# Patient Record
Sex: Male | Born: 1965 | Race: White | Hispanic: No | Smoking: Current every day smoker
Health system: Southern US, Community
[De-identification: ages and names within clinical notes are randomized; demographics above are authoritative.]

## PROBLEM LIST (undated history)

## (undated) DIAGNOSIS — F4310 Post-traumatic stress disorder, unspecified: Secondary | ICD-10-CM

## (undated) DIAGNOSIS — F419 Anxiety disorder, unspecified: Secondary | ICD-10-CM

## (undated) DIAGNOSIS — M545 Low back pain, unspecified: Secondary | ICD-10-CM

## (undated) DIAGNOSIS — I2699 Other pulmonary embolism without acute cor pulmonale: Secondary | ICD-10-CM

## (undated) DIAGNOSIS — C4442 Squamous cell carcinoma of skin of scalp and neck: Secondary | ICD-10-CM

## (undated) DIAGNOSIS — Z89619 Acquired absence of unspecified leg above knee: Secondary | ICD-10-CM

## (undated) DIAGNOSIS — I10 Essential (primary) hypertension: Secondary | ICD-10-CM

## (undated) DIAGNOSIS — I480 Paroxysmal atrial fibrillation: Secondary | ICD-10-CM

## (undated) DIAGNOSIS — R079 Chest pain, unspecified: Secondary | ICD-10-CM

## (undated) DIAGNOSIS — J449 Chronic obstructive pulmonary disease, unspecified: Secondary | ICD-10-CM

## (undated) DIAGNOSIS — K219 Gastro-esophageal reflux disease without esophagitis: Secondary | ICD-10-CM

## (undated) DIAGNOSIS — I38 Endocarditis, valve unspecified: Secondary | ICD-10-CM

## (undated) DIAGNOSIS — I219 Acute myocardial infarction, unspecified: Secondary | ICD-10-CM

## (undated) DIAGNOSIS — G8929 Other chronic pain: Secondary | ICD-10-CM

## (undated) DIAGNOSIS — Q6689 Other  specified congenital deformities of feet: Secondary | ICD-10-CM

## (undated) DIAGNOSIS — I2690 Septic pulmonary embolism without acute cor pulmonale: Secondary | ICD-10-CM

## (undated) DIAGNOSIS — I639 Cerebral infarction, unspecified: Secondary | ICD-10-CM

## (undated) DIAGNOSIS — Z9289 Personal history of other medical treatment: Secondary | ICD-10-CM

## (undated) DIAGNOSIS — F988 Other specified behavioral and emotional disorders with onset usually occurring in childhood and adolescence: Secondary | ICD-10-CM

## (undated) DIAGNOSIS — F1910 Other psychoactive substance abuse, uncomplicated: Secondary | ICD-10-CM

## (undated) DIAGNOSIS — I2510 Atherosclerotic heart disease of native coronary artery without angina pectoris: Secondary | ICD-10-CM

## (undated) DIAGNOSIS — E785 Hyperlipidemia, unspecified: Secondary | ICD-10-CM

## (undated) HISTORY — PX: CARDIAC CATHETERIZATION: SHX172

## (undated) HISTORY — PX: CORONARY ANGIOPLASTY WITH STENT PLACEMENT: SHX49

## (undated) HISTORY — PX: LAPAROSCOPIC CHOLECYSTECTOMY: SUR755

## (undated) HISTORY — DX: Personal history of other medical treatment: Z92.89

## (undated) HISTORY — PX: TONSILLECTOMY: SUR1361

## (undated) HISTORY — PX: CORONARY ANGIOPLASTY: SHX604

## (undated) HISTORY — PX: ABOVE KNEE LEG AMPUTATION: SUR20

## (undated) HISTORY — PX: ORTHOPEDIC SURGERY: SHX850

## (undated) HISTORY — DX: Other psychoactive substance abuse, uncomplicated: F19.10

## (undated) HISTORY — DX: Septic pulmonary embolism without acute cor pulmonale: I26.90

## (undated) HISTORY — DX: Other chronic pain: G89.29

## (undated) HISTORY — DX: Chest pain, unspecified: R07.9

---
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
## 2011-11-09 DIAGNOSIS — I38 Endocarditis, valve unspecified: Secondary | ICD-10-CM

## 2011-11-09 DIAGNOSIS — I2690 Septic pulmonary embolism without acute cor pulmonale: Secondary | ICD-10-CM

## 2011-11-09 HISTORY — DX: Endocarditis, valve unspecified: I38

## 2011-11-09 HISTORY — DX: Septic pulmonary embolism without acute cor pulmonale: I26.90

## 2014-01-10 DIAGNOSIS — R0602 Shortness of breath: Secondary | ICD-10-CM | POA: Insufficient documentation

## 2014-01-21 DIAGNOSIS — Z9861 Coronary angioplasty status: Secondary | ICD-10-CM | POA: Insufficient documentation

## 2014-01-21 DIAGNOSIS — Z955 Presence of coronary angioplasty implant and graft: Secondary | ICD-10-CM | POA: Insufficient documentation

## 2014-01-21 DIAGNOSIS — E7800 Pure hypercholesterolemia, unspecified: Secondary | ICD-10-CM | POA: Insufficient documentation

## 2014-09-15 DIAGNOSIS — I679 Cerebrovascular disease, unspecified: Secondary | ICD-10-CM | POA: Insufficient documentation

## 2014-09-15 DIAGNOSIS — I6782 Cerebral ischemia: Secondary | ICD-10-CM | POA: Insufficient documentation

## 2014-09-15 DIAGNOSIS — G939 Disorder of brain, unspecified: Secondary | ICD-10-CM | POA: Insufficient documentation

## 2014-09-15 DIAGNOSIS — I2699 Other pulmonary embolism without acute cor pulmonale: Secondary | ICD-10-CM | POA: Insufficient documentation

## 2014-10-29 DIAGNOSIS — M549 Dorsalgia, unspecified: Secondary | ICD-10-CM | POA: Insufficient documentation

## 2014-10-29 DIAGNOSIS — G8929 Other chronic pain: Secondary | ICD-10-CM | POA: Insufficient documentation

## 2014-10-29 DIAGNOSIS — K219 Gastro-esophageal reflux disease without esophagitis: Secondary | ICD-10-CM | POA: Insufficient documentation

## 2015-01-21 DIAGNOSIS — I808 Phlebitis and thrombophlebitis of other sites: Secondary | ICD-10-CM | POA: Insufficient documentation

## 2015-02-11 DIAGNOSIS — G894 Chronic pain syndrome: Secondary | ICD-10-CM | POA: Insufficient documentation

## 2015-04-17 DIAGNOSIS — Z955 Presence of coronary angioplasty implant and graft: Secondary | ICD-10-CM | POA: Insufficient documentation

## 2015-05-02 DIAGNOSIS — R55 Syncope and collapse: Secondary | ICD-10-CM | POA: Insufficient documentation

## 2015-05-30 DIAGNOSIS — N179 Acute kidney failure, unspecified: Secondary | ICD-10-CM | POA: Insufficient documentation

## 2015-05-30 DIAGNOSIS — B955 Unspecified streptococcus as the cause of diseases classified elsewhere: Secondary | ICD-10-CM | POA: Insufficient documentation

## 2015-05-30 DIAGNOSIS — R7881 Bacteremia: Secondary | ICD-10-CM

## 2015-05-30 DIAGNOSIS — M79661 Pain in right lower leg: Secondary | ICD-10-CM | POA: Insufficient documentation

## 2015-11-18 ENCOUNTER — Emergency Department (HOSPITAL_COMMUNITY): Payer: Medicare Other

## 2015-11-18 ENCOUNTER — Encounter (HOSPITAL_COMMUNITY): Payer: Self-pay | Admitting: Emergency Medicine

## 2015-11-18 ENCOUNTER — Inpatient Hospital Stay (HOSPITAL_COMMUNITY)
Admission: EM | Admit: 2015-11-18 | Discharge: 2015-11-19 | DRG: 303 | Disposition: A | Discharge status: Home / Self Care | Payer: Medicare Other | Attending: Internal Medicine | Admitting: Internal Medicine

## 2015-11-18 DIAGNOSIS — I25119 Atherosclerotic heart disease of native coronary artery with unspecified angina pectoris: Secondary | ICD-10-CM | POA: Diagnosis not present

## 2015-11-18 DIAGNOSIS — Z79891 Long term (current) use of opiate analgesic: Secondary | ICD-10-CM

## 2015-11-18 DIAGNOSIS — Z79899 Other long term (current) drug therapy: Secondary | ICD-10-CM

## 2015-11-18 DIAGNOSIS — Z955 Presence of coronary angioplasty implant and graft: Secondary | ICD-10-CM | POA: Diagnosis not present

## 2015-11-18 DIAGNOSIS — Z7902 Long term (current) use of antithrombotics/antiplatelets: Secondary | ICD-10-CM | POA: Diagnosis not present

## 2015-11-18 DIAGNOSIS — I10 Essential (primary) hypertension: Secondary | ICD-10-CM | POA: Diagnosis not present

## 2015-11-18 DIAGNOSIS — Z888 Allergy status to other drugs, medicaments and biological substances status: Secondary | ICD-10-CM

## 2015-11-18 DIAGNOSIS — I2510 Atherosclerotic heart disease of native coronary artery without angina pectoris: Secondary | ICD-10-CM

## 2015-11-18 DIAGNOSIS — G894 Chronic pain syndrome: Secondary | ICD-10-CM | POA: Diagnosis not present

## 2015-11-18 DIAGNOSIS — F411 Generalized anxiety disorder: Secondary | ICD-10-CM | POA: Insufficient documentation

## 2015-11-18 DIAGNOSIS — Z8249 Family history of ischemic heart disease and other diseases of the circulatory system: Secondary | ICD-10-CM | POA: Diagnosis not present

## 2015-11-18 DIAGNOSIS — R079 Chest pain, unspecified: Secondary | ICD-10-CM | POA: Diagnosis present

## 2015-11-18 DIAGNOSIS — I252 Old myocardial infarction: Secondary | ICD-10-CM | POA: Diagnosis not present

## 2015-11-18 DIAGNOSIS — Z801 Family history of malignant neoplasm of trachea, bronchus and lung: Secondary | ICD-10-CM

## 2015-11-18 DIAGNOSIS — F419 Anxiety disorder, unspecified: Secondary | ICD-10-CM | POA: Diagnosis not present

## 2015-11-18 DIAGNOSIS — I480 Paroxysmal atrial fibrillation: Secondary | ICD-10-CM | POA: Diagnosis present

## 2015-11-18 DIAGNOSIS — F17210 Nicotine dependence, cigarettes, uncomplicated: Secondary | ICD-10-CM | POA: Diagnosis not present

## 2015-11-18 DIAGNOSIS — Z880 Allergy status to penicillin: Secondary | ICD-10-CM | POA: Diagnosis not present

## 2015-11-18 DIAGNOSIS — E785 Hyperlipidemia, unspecified: Secondary | ICD-10-CM | POA: Diagnosis not present

## 2015-11-18 DIAGNOSIS — Z89512 Acquired absence of left leg below knee: Secondary | ICD-10-CM | POA: Diagnosis not present

## 2015-11-18 DIAGNOSIS — I209 Angina pectoris, unspecified: Secondary | ICD-10-CM | POA: Diagnosis not present

## 2015-11-18 DIAGNOSIS — Z8673 Personal history of transient ischemic attack (TIA), and cerebral infarction without residual deficits: Secondary | ICD-10-CM

## 2015-11-18 DIAGNOSIS — Z86711 Personal history of pulmonary embolism: Secondary | ICD-10-CM

## 2015-11-18 DIAGNOSIS — F17200 Nicotine dependence, unspecified, uncomplicated: Secondary | ICD-10-CM | POA: Diagnosis present

## 2015-11-18 HISTORY — DX: Other chronic pain: G89.29

## 2015-11-18 HISTORY — DX: Other specified behavioral and emotional disorders with onset usually occurring in childhood and adolescence: F98.8

## 2015-11-18 HISTORY — DX: Acute myocardial infarction, unspecified: I21.9

## 2015-11-18 HISTORY — DX: Low back pain: M54.5

## 2015-11-18 HISTORY — DX: Hyperlipidemia, unspecified: E78.5

## 2015-11-18 HISTORY — DX: Cerebral infarction, unspecified: I63.9

## 2015-11-18 HISTORY — DX: Gastro-esophageal reflux disease without esophagitis: K21.9

## 2015-11-18 HISTORY — DX: Low back pain, unspecified: M54.50

## 2015-11-18 HISTORY — DX: Anxiety disorder, unspecified: F41.9

## 2015-11-18 HISTORY — DX: Post-traumatic stress disorder, unspecified: F43.10

## 2015-11-18 HISTORY — DX: Essential (primary) hypertension: I10

## 2015-11-18 HISTORY — DX: Atherosclerotic heart disease of native coronary artery without angina pectoris: I25.10

## 2015-11-18 LAB — BASIC METABOLIC PANEL
Anion gap: 11 (ref 5–15)
BUN: 8 mg/dL (ref 6–20)
CALCIUM: 9.6 mg/dL (ref 8.9–10.3)
CO2: 25 mmol/L (ref 22–32)
CREATININE: 1.04 mg/dL (ref 0.61–1.24)
Chloride: 105 mmol/L (ref 101–111)
Glucose, Bld: 90 mg/dL (ref 65–99)
Potassium: 4 mmol/L (ref 3.5–5.1)
Sodium: 141 mmol/L (ref 135–145)

## 2015-11-18 LAB — D-DIMER, QUANTITATIVE: D-Dimer, Quant: <0.27 ug/mL-FEU (ref 0.00–0.50)

## 2015-11-18 LAB — RAPID URINE DRUG SCREEN, HOSP PERFORMED
Amphetamines: NOT DETECTED
BARBITURATES: NOT DETECTED
Benzodiazepines: NOT DETECTED
Cocaine: NOT DETECTED
OPIATES: NOT DETECTED
TETRAHYDROCANNABINOL: NOT DETECTED

## 2015-11-18 LAB — CBC
HCT: 41.8 % (ref 39.0–52.0)
Hemoglobin: 14.1 g/dL (ref 13.0–17.0)
MCH: 32 pg (ref 26.0–34.0)
MCHC: 33.7 g/dL (ref 30.0–36.0)
MCV: 95 fL (ref 78.0–100.0)
PLATELETS: 314 10*3/uL (ref 150–400)
RBC: 4.4 MIL/uL (ref 4.22–5.81)
RDW: 14 % (ref 11.5–15.5)
WBC: 8.9 10*3/uL (ref 4.0–10.5)

## 2015-11-18 LAB — I-STAT TROPONIN, ED: TROPONIN I, POC: 0 ng/mL (ref 0.00–0.08)

## 2015-11-18 MED ORDER — OXYCODONE HCL 5 MG PO TABS
5.0000 mg | ORAL_TABLET | ORAL | Status: DC | PRN
  Filled 2015-11-18: qty 1

## 2015-11-18 MED ORDER — ONDANSETRON HCL 4 MG/2ML IJ SOLN: 4.0000 mg | Freq: Four times a day (QID) | INTRAMUSCULAR | Status: DC | PRN

## 2015-11-18 MED ORDER — ALPRAZOLAM 0.5 MG PO TABS
1.0000 mg | ORAL_TABLET | Freq: Three times a day (TID) | ORAL | Status: DC
  Administered 2015-11-18 – 2015-11-19 (×2): 1 mg via ORAL
  Filled 2015-11-18 (×4): qty 2

## 2015-11-18 MED ORDER — NICOTINE 21 MG/24HR TD PT24
21.0000 mg | MEDICATED_PATCH | Freq: Every day | TRANSDERMAL | Status: DC
  Administered 2015-11-18: 21 mg via TRANSDERMAL
  Filled 2015-11-18: qty 1

## 2015-11-18 MED ORDER — ENOXAPARIN SODIUM 40 MG/0.4ML ~~LOC~~ SOLN
40.0000 mg | Freq: Every day | SUBCUTANEOUS | Status: DC
  Filled 2015-11-18: qty 0.4

## 2015-11-18 MED ORDER — MORPHINE SULFATE (PF) 2 MG/ML IV SOLN: 2.0000 mg | INTRAVENOUS | Status: DC | PRN

## 2015-11-18 MED ORDER — OXYMORPHONE HCL ER 10 MG PO T12A
40.0000 mg | EXTENDED_RELEASE_TABLET | Freq: Two times a day (BID) | ORAL | Status: DC
  Administered 2015-11-18 – 2015-11-19 (×2): 40 mg via ORAL
  Filled 2015-11-18 (×3): qty 4

## 2015-11-18 MED ORDER — OXYCODONE-ACETAMINOPHEN 5-325 MG PO TABS
1.0000 | ORAL_TABLET | ORAL | Status: DC | PRN
  Filled 2015-11-18: qty 1

## 2015-11-18 MED ORDER — CLOPIDOGREL BISULFATE 75 MG PO TABS
75.0000 mg | ORAL_TABLET | Freq: Every day | ORAL | Status: DC
  Administered 2015-11-19: 75 mg via ORAL
  Filled 2015-11-18: qty 1

## 2015-11-18 MED ORDER — RANOLAZINE ER 500 MG PO TB12
500.0000 mg | ORAL_TABLET | Freq: Every day | ORAL | Status: DC
  Administered 2015-11-19: 500 mg via ORAL
  Filled 2015-11-18: qty 1

## 2015-11-18 MED ORDER — ACETAMINOPHEN 325 MG PO TABS
650.0000 mg | ORAL_TABLET | ORAL | Status: DC | PRN
Start: 2015-11-18 — End: 2015-11-19

## 2015-11-18 MED ORDER — ASPIRIN 81 MG PO CHEW
324.0000 mg | CHEWABLE_TABLET | Freq: Once | ORAL | Status: AC
  Administered 2015-11-18: 324 mg via ORAL
  Filled 2015-11-18: qty 4

## 2015-11-18 MED ORDER — ROSUVASTATIN CALCIUM 10 MG PO TABS: 5.0000 mg | ORAL_TABLET | Freq: Every day | ORAL | Status: DC

## 2015-11-18 MED ORDER — REGADENOSON 0.4 MG/5ML IV SOLN
0.4000 mg | Freq: Once | INTRAVENOUS | Status: AC
  Administered 2015-11-19: 0.4 mg via INTRAVENOUS
  Filled 2015-11-18: qty 5

## 2015-11-18 MED ORDER — OXYCODONE-ACETAMINOPHEN 10-325 MG PO TABS: 1.0000 | ORAL_TABLET | ORAL | Status: DC | PRN

## 2015-11-18 MED ORDER — NEBIVOLOL HCL 5 MG PO TABS
10.0000 mg | ORAL_TABLET | Freq: Every day | ORAL | Status: DC
  Administered 2015-11-19: 10 mg via ORAL
  Filled 2015-11-18: qty 2

## 2015-11-18 MED ORDER — NITROGLYCERIN 0.4 MG SL SUBL: 0.4000 mg | SUBLINGUAL_TABLET | SUBLINGUAL | Status: DC | PRN

## 2015-11-18 NOTE — Progress Notes (Addendum)
Error

## 2015-11-18 NOTE — ED Provider Notes (Signed)
CSN: [id number]     Arrival date & time 11/18/15  1431 History   First MD Initiated Contact with Patient 11/18/15 1731     Chief Complaint  Patient presents with  . 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 He presents today with substernal chest pain that has been intermittent, constant since 11 AM this morning. Pressure radiates to his shoulder blades. Accompanied by slight shortness of breath and a little bit of nausea. No diaphoresis.  Pt still smokes. Previous IV drug user, hasn't used in "a long time". Also has a history of pericarditis and endocarditis.   He denies cough, fever, chills, N/V, diarrhea, constipation, hematemesis, dysuria, hematuria, sick contacts, or recent travel.   (Consider location/radiation/quality/duration/timing/severity/associated sxs/prior Treatment) Patient is a 50 y.o. male presenting with chest pain.  Chest Pain Pain location:  Substernal area Pain quality: pressure   Pain radiates to:  Upper back Pain radiates to the back: yes   Pain severity:  Moderate Onset quality:  Sudden Duration:  1 week (1 week intermittently, constant since 11AM) Chronicity:  Recurrent Context: at rest   Associated symptoms: nausea and shortness of breath   Associated symptoms: no abdominal pain, no dizziness, no fever, no headache, no palpitations and not vomiting   Risk factors: coronary artery disease, high cholesterol, hypertension, male sex and smoking   Risk factors: no diabetes mellitus     Past Medical History  Diagnosis Date  . Coronary artery disease   . S/P AKA (above knee amputation) ([place])   . Hypertension   . Stroke ([place])   . PE (pulmonary embolism)    History reviewed. No pertinent past surgical history. History reviewed. No pertinent  family history. Social History  Substance Use Topics  . Smoking status: Current Every Day Smoker  . Smokeless tobacco: None  . Alcohol Use: No    Review of Systems  Constitutional: Negative for fever and chills.  Respiratory: Positive for shortness of breath.   Cardiovascular: Positive for chest pain. Negative for palpitations and leg swelling.  Gastrointestinal: Positive for nausea. Negative for vomiting, abdominal pain, diarrhea, constipation and abdominal distention.  Genitourinary: Negative for dysuria, frequency, flank pain and decreased urine volume.  Skin: Negative for rash and wound.  Neurological: Negative for dizziness, speech difficulty, light-headedness and headaches.  All other systems reviewed and are negative.     Allergies  Adipex-p; Benadryl; Promethazine hcl; Augmentin; and Toradol  Home Medications   Prior to Admission medications   Medication Sig Start Date End Date Taking? Authorizing Provider  ALPRAZolam Duanne Moron) 1 MG tablet Take 1 mg by mouth 3 (three) times daily.   Yes Historical Provider, MD  clopidogrel (PLAVIX) 75 MG tablet Take 75 mg by mouth daily.   Yes Historical Provider, MD  nebivolol (BYSTOLIC) 10 MG tablet Take 10 mg by mouth daily.   Yes Historical Provider, MD  oxyCODONE-acetaminophen (PERCOCET) 10-325 MG tablet Take 1 tablet by mouth every 4 (four) hours as needed for pain.   Yes Historical Provider, MD  oxymorphone (OPANA ER) 40 MG 12 hr tablet Take 40 mg by mouth every 12 (twelve) hours.   Yes Historical Provider, MD  ranolazine (RANEXA) 1000 MG SR tablet Take 500 mg by mouth daily.   Yes Historical Provider, MD  rosuvastatin (CRESTOR) 5 MG tablet Take 5 mg by mouth daily at 6 PM.   Yes Historical Provider, MD   BP 140/91 mmHg  Pulse 74  Temp(Src) 98.6 F (37 C) (Oral)  Resp 23  SpO2 99% Physical Exam  Constitutional: He is oriented to person, place, and time. He appears well-developed and well-nourished. No distress.  HENT:  Head:  Normocephalic and atraumatic.  Eyes: Pupils are equal, round, and reactive to light.  Neck: Normal range of motion.  Cardiovascular: Normal rate, regular rhythm, normal heart sounds and intact distal pulses.  Exam reveals no gallop and no friction rub.   No murmur heard. Pulmonary/Chest: Effort normal and breath sounds normal. No respiratory distress. He has no wheezes. He has no rales. He exhibits no tenderness.  Abdominal: Soft. Bowel sounds are normal. He exhibits no distension and no mass. There is no tenderness. There is no rebound and no guarding.  Musculoskeletal: Normal range of motion. He exhibits no edema or tenderness.  Left AKA. No thigh pain. Right leg w/no edema, no posterior knee pain.   Lymphadenopathy:    He has no cervical adenopathy.  Neurological: He is alert and oriented to person, place, and time. No cranial nerve deficit. Coordination normal.  Skin: Skin is warm and dry. He is not diaphoretic.  Nursing note and vitals reviewed.   ED Course  Procedures (including critical care time) Labs Review Labs Reviewed  BASIC METABOLIC PANEL  CBC  D-DIMER, QUANTITATIVE (NOT AT [facility])  I-STAT TROPOININ, ED  Randolm Idol, ED    Imaging Review Dg Chest 2 View  11/18/2015  CLINICAL DATA:  Chest pain, shortness of breath since noon today. EXAM: CHEST  2 VIEW COMPARISON:  None. FINDINGS: Heart is normal size. Mediastinal contours are within limits. Coronary artery stents noted. Lungs are clear. No effusions. No acute bony abnormality. IMPRESSION: No active cardiopulmonary disease. Electronically Signed   By: Rolm Baptise M.D.   On: 11/18/2015 16:01   I have personally reviewed and evaluated these images and lab results as part of my medical decision-making.   EKG Interpretation   Date/Time:  Tuesday November 18 2015 14:42:15 EST Ventricular Rate:  93 PR Interval:  134 QRS Duration: 84 QT Interval:  336 QTC Calculation: 417 R Axis:   77 Text Interpretation:  Normal  sinus rhythm Normal ECG No old tracing to  compare Confirmed by GOLDSTON  MD, SCOTT ([id number]) on 11/18/2015 5:45:37 PM      MDM   Final diagnoses:  Chest pain at rest   50 year old male here with chest pain. See history of present illness for details. On exam patient in NAD, AF, VSS. Physical exam completely benign. Chest x-ray with no abnormalities. Initial troponin 0. Labs are reassuring. EKG with NSR no sign of ischemia or arrhythmia. No prior to compare.  Given ASA and nitro for CP.  Attempted chart review to validate patient's extensive cardiac history. Unsuccessful. Will discuss with cardiology.--> feel pt likely needs stress test in AM.   Pt is high cardiac risk by history. Will admit to hospital w/cardiology following. Will also need his chronic meds managed as he has not been able to set up with a PCP yet: needs plavix for PE and all home meds.   Pt was seen under the supervision of Dr. Regenia Skeeter.     Sherian Maroon, MD 11/18/15 1932  Sherwood Gambler, MD 11/21/15 [phone]

## 2015-11-18 NOTE — ED Notes (Signed)
MD at bedside. 

## 2015-11-18 NOTE — H&P (Signed)
[facility] History and Physical  Aaron Dodson [id number] DOB: 1966-04-10 DOA: 11/18/2015  Referring physician: Dr.SMITH. PCP: Pcp Not In System patient just recently moved into [place]. Specialists: Patient just recently moved into [place].  Chief Complaint: Chest pain.  HPI: Aaron Dodson is a 50 y.o. male with history of CAD status post stenting, hypertension, hyperlipidemia and history of PE presents to the ER with complaints of chest pain. Patient states he has been having chest pain which has been chronic but recently worsened. Patient chest pain is present even at rest. Retrosternal nonradiating pressure-like. Denies any associated shortness of breath productive cough fever chills. In the ER chest x-ray EKG were unremarkable cardiac markers were negative and on-call cardiologist were consulted and patient has been admitted for further management. Sublingual nitroglycerin eases the chest pain but does not completely relieve.   Review of Systems: As presented in the history of presenting illness, rest negative.  Past Medical History  Diagnosis Date  . Coronary artery disease   . Hypertension   . PE (pulmonary embolism) ~ 2007?; 09/2015  . Hyperlipidemia   . Anginal pain ([place])   . PTSD (post-traumatic stress disorder)   . Myocardial infarction [facility]) 2000's - ~ 04/2015    "I've had a total of 3" (11/18/2015)  . GERD (gastroesophageal reflux disease)   . Stroke [facility]) ~ 2006    denies residual on 11/18/2015  . Chronic lower back pain   . Anxiety   . ADD (attention deficit disorder)    Past Surgical History  Procedure Laterality Date  . Tonsillectomy    . Laparoscopic cholecystectomy    . Cardiac catheterization  "several"  . Coronary angioplasty  "several"  . Coronary angioplasty with stent placement  "several"    "total of 10 stents placed" (11/18/2015)  . Club foot release Left 03-17-1967  . Above knee leg amputation Left   . Orthopedic surgery  04/20/1967-~ 2007    "total of 52 on my left leg; started w/club foot released"   Social History:  reports that he has been smoking Cigarettes.  He has a 52.5 pack-year smoking history. He has never used smokeless tobacco. He reports that he uses illicit drugs. He reports that he does not drink alcohol. Where does patient live home. Can patient participate in ADLs? Yes.  Allergies  Allergen Reactions  . Adipex-P [Phentermine]     Aggressive behavior  . Benadryl [Diphenhydramine Hcl]     Aggressive behavior  . Promethazine Hcl     Unknown - given for surgical procedure and advised to never take again  . Augmentin [Amoxicillin-Pot Clavulanate] Diarrhea and Nausea Only  . Toradol [Ketorolac Tromethamine] Hives    Redness    Family History:  Family History  Problem Relation Age of Onset  . Hypertension Father   . Lung cancer Father       Prior to Admission medications   Medication Sig Start Date End Date Taking? Authorizing Provider  ALPRAZolam Duanne Moron) 1 MG tablet Take 1 mg by mouth 3 (three) times daily.   Yes Historical Provider, MD  clopidogrel (PLAVIX) 75 MG tablet Take 75 mg by mouth daily.   Yes Historical Provider, MD  nebivolol (BYSTOLIC) 10 MG tablet Take 10 mg by mouth daily.   Yes Historical Provider, MD  oxyCODONE-acetaminophen (PERCOCET) 10-325 MG tablet Take 1 tablet by mouth every 4 (four) hours as needed for pain.   Yes Historical Provider, MD  oxymorphone (OPANA ER) 40 MG 12 hr tablet Take 40 mg by  mouth every 12 (twelve) hours.   Yes Historical Provider, MD  ranolazine (RANEXA) 1000 MG SR tablet Take 500 mg by mouth daily.   Yes Historical Provider, MD  rosuvastatin (CRESTOR) 5 MG tablet Take 5 mg by mouth daily at 6 PM.   Yes Historical Provider, MD    Physical Exam: Filed Vitals:   11/18/15 1930 11/18/15 2014 11/18/15 2030 11/18/15 2133  BP: 137/98 134/91 152/93 168/76  Pulse: 80 69 73 76  Temp:    98.5 F (36.9 C)  TempSrc:    Oral  Resp: 12 16 19 18   Height:    6' (1.829  m)  Weight:    74.481 kg (164 lb 3.2 oz)  SpO2: 97% 100% 100% 100%     General:  Moderately built and nourished.  Eyes: Anicteric no pallor.  ENT: No discharge from the ears eyes nose or mouth.  Neck: No JVD appreciated. No mass felt.  Cardiovascular: S1 and S2 heard.  Respiratory: No rhonchi or crepitations.  Abdomen: Soft nontender bowel sounds present. No guarding or rigidity.  Skin: No rash.  Musculoskeletal: No edema.  Psychiatric: Appears normal.  Neurologic: Alert awake oriented to time place and person. Moves all extremities.  Labs on Admission:  Basic Metabolic Panel:  Recent Labs Lab 11/18/15 1447  NA 141  K 4.0  CL 105  CO2 25  GLUCOSE 90  BUN 8  CREATININE 1.04  CALCIUM 9.6   Liver Function Tests: No results for input(s): AST, ALT, ALKPHOS, BILITOT, PROT, ALBUMIN in the last 168 hours. No results for input(s): LIPASE, AMYLASE in the last 168 hours. No results for input(s): AMMONIA in the last 168 hours. CBC:  Recent Labs Lab 11/18/15 1447  WBC 8.9  HGB 14.1  HCT 41.8  MCV 95.0  PLT 314   Cardiac Enzymes: No results for input(s): CKTOTAL, CKMB, CKMBINDEX, TROPONINI in the last 168 hours.  BNP (last 3 results) No results for input(s): BNP in the last 8760 hours.  ProBNP (last 3 results) No results for input(s): PROBNP in the last 8760 hours.  CBG: No results for input(s): GLUCAP in the last 168 hours.  Radiological Exams on Admission: Dg Chest 2 View  11/18/2015  CLINICAL DATA:  Chest pain, shortness of breath since noon today. EXAM: CHEST  2 VIEW COMPARISON:  None. FINDINGS: Heart is normal size. Mediastinal contours are within limits. Coronary artery stents noted. Lungs are clear. No effusions. No acute bony abnormality. IMPRESSION: No active cardiopulmonary disease. Electronically Signed   By: Rolm Baptise M.D.   On: 11/18/2015 16:01    EKG: Independently reviewed. Normal sinus rhythm.  Assessment/Plan Principal Problem:    Chest pain Active Problems:   HLD (hyperlipidemia)   Essential hypertension   Tobacco abuse   1. Chest pain - concerning for angina. Appreciate cardiology consult. Cycle cardiac markers. Patient is on Plavix and statins and beta blockers. Nothing by mouth past for a.m. for stress test. 2. Hypertension - initially blood pressure was mildly elevated. Closely follow blood pressure trends. Continue home medications. 3. Hyperlipidemia on statins. 4. Tobacco abuse - tobacco cessation counseling requested.   DVT Prophylaxis Lovenox.  Code Status: Full code.  Family Communication: Discussed with patient's family at the bedside.  Disposition Plan: Admit for observation.    Lerlene Treadwell N. [facility] Pager [phone].  If 7PM-7AM, please contact night-coverage [email or web address] Password [email or web address] 11/18/2015, 10:59 PM

## 2015-11-18 NOTE — ED Notes (Signed)
Pt sts left sided CP with SOB; pt sts hx of stents; pt sts pain started 3 hours ago

## 2015-11-18 NOTE — ED Notes (Signed)
Attempted IV start x3. This RN one time and RN Hayley x2 Ultrasound guided. Unsuccessful. Floor RN notified. States she will put in IV team order

## 2015-11-18 NOTE — Consult Note (Signed)
Patient ID: Aaron Dodson MRN: [id number], DOB/AGE: 02/06/1966   Admit date: 11/18/2015   Primary Physician: No PCP Per Patient Primary Cardiologist: New  Pt. Profile:  50 y/o male, recently moved to the area from [place], with reported long h/o CAD and PAF presenting to ED with CP c/w prior angina.   Problem List  Past Medical History  Diagnosis Date  . Coronary artery disease   . S/P AKA (above knee amputation) ([place])   . Hypertension   . Stroke ([place])   . PE (pulmonary embolism)     History reviewed. No pertinent past surgical history.   Allergies  Allergies  Allergen Reactions  . Adipex-P [Phentermine]     Aggressive behavior  . Benadryl [Diphenhydramine Hcl]     Aggressive behavior  . Promethazine Hcl     Unknown - given for surgical procedure and advised to never take again  . Augmentin [Amoxicillin-Pot Clavulanate] Diarrhea and Nausea Only  . Toradol [Ketorolac Tromethamine] Hives    Redness    HPI  50 y/o male, recently moved to the area from [place], with reported long h/o CAD presenting to ED with CP c/w prior angina. He has received cardiac care are both in [place] and [place]. He has had multiple MIs and strokes. His last intervention was in June 2015 and he underwent stenting to his RCA and LCx. Also with a h/o PAF, tobacco abuse, HTN and left BKA, now with a prosthesis.   He reports that he has to report for prison on 12/01/15 for 10 months. He has been having symptoms of recurrent angina. He tried to get in with a new PCP but cannot get an appointment before his prison report date. He wants to be assessed for worsening CAD prior to being detained. Thus, he reported to the ED for evaluation. Cardiac enzymes are negative x 2. EKG shows NSR w/o ischemia. He is currently CP free.   Home Medications  Prior to Admission medications   Medication Sig Start Date End Date Taking? Authorizing Provider  ALPRAZolam Duanne Moron) 1 MG tablet Take 1 mg by  mouth 3 (three) times daily.   Yes Historical Provider, MD  clopidogrel (PLAVIX) 75 MG tablet Take 75 mg by mouth daily.   Yes Historical Provider, MD  nebivolol (BYSTOLIC) 10 MG tablet Take 10 mg by mouth daily.   Yes Historical Provider, MD  oxyCODONE-acetaminophen (PERCOCET) 10-325 MG tablet Take 1 tablet by mouth every 4 (four) hours as needed for pain.   Yes Historical Provider, MD  oxymorphone (OPANA ER) 40 MG 12 hr tablet Take 40 mg by mouth every 12 (twelve) hours.   Yes Historical Provider, MD  ranolazine (RANEXA) 1000 MG SR tablet Take 500 mg by mouth daily.   Yes Historical Provider, MD  rosuvastatin (CRESTOR) 5 MG tablet Take 5 mg by mouth daily at 6 PM.   Yes Historical Provider, MD    Family History  Family History  Problem Relation Age of Onset  . Hypertension Mother     Social History  Social History   Social History  . Marital Status: Divorced    Spouse Name: N/A  . Number of Children: N/A  . Years of Education: N/A   Occupational History  . Not on file.   Social History Main Topics  . Smoking status: Current Every Day Smoker  . Smokeless tobacco: Not on file  . Alcohol Use: No  . Drug Use: No     Comment: 8 months clean hx  of IV  . Sexual Activity: Not on file   Other Topics Concern  . Not on file   Social History Narrative  . No narrative on file     Review of Systems General:  No chills, fever, night sweats or weight changes.  Cardiovascular:  No chest pain, dyspnea on exertion, edema, orthopnea, palpitations, paroxysmal nocturnal dyspnea. Dermatological: No rash, lesions/masses Respiratory: No cough, dyspnea Urologic: No hematuria, dysuria Abdominal:   No nausea, vomiting, diarrhea, bright red blood per rectum, melena, or hematemesis Neurologic:  No visual changes, wkns, changes in mental status. All other systems reviewed and are otherwise negative except as noted above.  Physical Exam  Blood pressure 137/98, pulse 80, temperature 98.6 F  (37 C), temperature source Oral, resp. rate 12, SpO2 97 %.  General: Pleasant, NAD Psych: Normal affect. Neuro: Alert and oriented X 3. Moves all extremities spontaneously. HEENT: Normal  Neck: Supple without bruits or JVD. Lungs:  Resp regular and unlabored, CTA. Heart: RRR no s3, s4, or murmurs. Abdomen: Soft, non-tender, non-distended, BS + x 4.  Extremities:  s/p left BKA, has a prosthetic leg. No clubbing, cyanosis or edema of right LE. DP/PT/Radials 2+ and equal bilaterally.  Labs  Troponin [facility] Test)  Recent Labs  11/18/15 1453  TROPIPOC 0.00   No results for input(s): CKTOTAL, CKMB, TROPONINI in the last 72 hours. Lab Results  Component Value Date   WBC 8.9 11/18/2015   HGB 14.1 11/18/2015   HCT 41.8 11/18/2015   MCV 95.0 11/18/2015   PLT 314 11/18/2015     Recent Labs Lab 11/18/15 1447  NA 141  K 4.0  CL 105  CO2 25  BUN 8  CREATININE 1.04  CALCIUM 9.6  GLUCOSE 90   No results found for: CHOL, HDL, LDLCALC, TRIG No results found for: DDIMER   Radiology/Studies  Dg Chest 2 View  11/18/2015  CLINICAL DATA:  Chest pain, shortness of breath since noon today. EXAM: CHEST  2 VIEW COMPARISON:  None. FINDINGS: Heart is normal size. Mediastinal contours are within limits. Coronary artery stents noted. Lungs are clear. No effusions. No acute bony abnormality. IMPRESSION: No active cardiopulmonary disease. Electronically Signed   By: Rolm Baptise M.D.   On: 11/18/2015 16:01    ECG  NSR. No ischemia    ASSESSMENT AND PLAN  1. Angina: patient reports h/o CAD with multiple MIs and PCIs in the past at OSH. He has had symptoms c/w with his typical angina and wants w/u to r/o new ischemia prior to being detained (prison for 10 months starting 12/01/15). His EKG shows NSR and cardiac enzymes are negative x 2. Recommend admission for observation. Cycle enzymes and plan for stress test in the am to r/o ischemia. IM to admit. Continue Plavix, Bystolic, Crestor  and Ranexa.   2. HTN: his BP is a bit elevated in the ED. Continue home Bystolic. Renal function is normal. Consider addition of ACE-I for added coverage, given his CAD history.    Signed, Lyda Jester, PA-C 11/18/2015, 7:34 PM  Personally seen and examined. Agree with above.  50 year old male about to undergo 10 month incarceration. In mid January experiencing chest discomfort. He reports RCA as well as circumflex stenting performed by [facility] cardiology.  Thus far, troponin is normal. EKG also reassuring. If remains normal, we will proceed with nuclear stress test in the morning. If this is low risk, no further workup necessary. Continue antianginal medications as well as antiplatelet medications as above.  Continue to monitor blood pressure closely.  Candee Furbish, MD

## 2015-11-19 ENCOUNTER — Other Ambulatory Visit (HOSPITAL_COMMUNITY): Payer: Medicare Other

## 2015-11-19 ENCOUNTER — Encounter (HOSPITAL_COMMUNITY): Payer: Medicare Other

## 2015-11-19 ENCOUNTER — Observation Stay (HOSPITAL_COMMUNITY): Payer: Medicare Other

## 2015-11-19 DIAGNOSIS — R079 Chest pain, unspecified: Secondary | ICD-10-CM | POA: Diagnosis not present

## 2015-11-19 DIAGNOSIS — I10 Essential (primary) hypertension: Secondary | ICD-10-CM | POA: Diagnosis not present

## 2015-11-19 DIAGNOSIS — I25119 Atherosclerotic heart disease of native coronary artery with unspecified angina pectoris: Secondary | ICD-10-CM | POA: Diagnosis not present

## 2015-11-19 DIAGNOSIS — Z9861 Coronary angioplasty status: Secondary | ICD-10-CM

## 2015-11-19 DIAGNOSIS — I2510 Atherosclerotic heart disease of native coronary artery without angina pectoris: Secondary | ICD-10-CM

## 2015-11-19 DIAGNOSIS — E785 Hyperlipidemia, unspecified: Secondary | ICD-10-CM

## 2015-11-19 DIAGNOSIS — Z720 Tobacco use: Secondary | ICD-10-CM | POA: Diagnosis not present

## 2015-11-19 DIAGNOSIS — F411 Generalized anxiety disorder: Secondary | ICD-10-CM | POA: Insufficient documentation

## 2015-11-19 LAB — NM MYOCAR MULTI W/SPECT W/WALL MOTION / EF
CHL CUP RESTING HR STRESS: 96 {beats}/min
Exercise duration (min): 4 min

## 2015-11-19 LAB — POCT I-STAT TROPONIN I: Troponin i, poc: 0 ng/mL (ref 0.00–0.08)

## 2015-11-19 LAB — TROPONIN I: Troponin I: <0.03 ng/mL (ref <0.031)

## 2015-11-19 MED ORDER — TECHNETIUM TC 99M SESTAMIBI GENERIC - CARDIOLITE
10.0000 | Freq: Once | INTRAVENOUS | Status: AC | PRN
  Administered 2015-11-19: 10 via INTRAVENOUS

## 2015-11-19 MED ORDER — REGADENOSON 0.4 MG/5ML IV SOLN
INTRAVENOUS | Status: AC
  Filled 2015-11-19: qty 5

## 2015-11-19 MED ORDER — TECHNETIUM TC 99M SESTAMIBI GENERIC - CARDIOLITE
30.0000 | Freq: Once | INTRAVENOUS | Status: AC | PRN
  Administered 2015-11-19: 30 via INTRAVENOUS

## 2015-11-19 NOTE — Discharge Summary (Signed)
Physician Discharge Summary  [place] Huttner [id number] DOB: 12/08/1965 DOA: 11/18/2015  PCP: Pcp Not In System  Admit date: 11/18/2015 Discharge date: 11/19/2015  Time spent: 35 minutes  Recommendations for Outpatient Follow-up:  1. Reassess BP and adjust antihypertensive regimen as needed 2. Follow/assist tobacco quitting process   Discharge Diagnoses:  Principal Problem:   Chest pain Active Problems:   HLD (hyperlipidemia)   Essential hypertension   Tobacco abuse   CAD S/P - s/p RCA/ CFX left BKA Anxiety   Discharge Condition: stable and improved. No CP or SOB at discharge. Will follow up with PCP at discharge.  Diet recommendation: heart healthy diet   Filed Weights   11/18/15 2133 11/19/15 0415  Weight: 74.481 kg (164 lb 3.2 oz) 72.576 kg (160 lb)    History of present illness:  50 y.o. male with history of CAD status post stenting, hypertension, hyperlipidemia and history of PE presents to the ER with complaints of chest pain. Patient states he has been having chest pain which has been chronic but recently worsened. Patient chest pain is present even at rest. Retrosternal, non- radiating, pressure-like. Denies any associated shortness of breath, productive cough, fever, chills, nausea and vomiting. In the ER chest x-ray and EKG were unremarkable; cardiac markers were negative and on-call cardiologist was consulted and patient has been admitted for further management. Sublingual nitroglycerin eases the chest pain but does not completely relieved it.   Hospital Course:  1-Chest pain: with concerns for angina, given typical and atypical presentation -heart score 4 -neg troponin -no EKG or telemetry abnormalities for ischemia -low risk probability Myoview -per cardiology rec's continue medical management -will continue statins, b-blockers, ranexa and plavix   2-essential HTN: continue home antihypertensive regimen  3-HLD: continue statins  4-tobacco abuse: cessation  counseling provided  5-Anxiety: continue PRN xanax  6-chronic pain syndrome: continue home pain medications  7-left BKA: stable and with good use of prosthesis      Procedures:  Nuclear Myoview:  1. Reduced inferior wall measured perfusion on stress and rest images, large size moderate severity, probably from diaphragmatic attenuation, less likely due to scar given the relatively normal wall motion and wall thickening in this vicinity.  2. Normal left ventricular wall motion.  3. Left ventricular ejection fraction 57%  4. Low-risk stress test findings*.  Consultations:  Cardiology   Discharge Exam: Filed Vitals:   11/19/15 0940 11/19/15 1040  BP:  136/80  Pulse: 108 100  Temp:    Resp:  14    General: afebrile, no CP and no SOB. Patient denies nausea, vomiting and palpitations  Cardiovascular: S1 and S2, no rubs, no murmurs or gallops Respiratory: CTA bilaterally Abd: soft, NT, ND, positive BS  Discharge Instructions   Discharge Instructions    Discharge instructions    Complete by:  As directed   Follow a heart healthy diet Take medications as prescribed Arrange follow up with PCP in 10 days Keep yourself well hydrated          Current Discharge Medication List    CONTINUE these medications which have NOT CHANGED   Details  ALPRAZolam (XANAX) 1 MG tablet Take 1 mg by mouth 3 (three) times daily.    clopidogrel (PLAVIX) 75 MG tablet Take 75 mg by mouth daily.    nebivolol (BYSTOLIC) 10 MG tablet Take 10 mg by mouth daily.    oxyCODONE-acetaminophen (PERCOCET) 10-325 MG tablet Take 1 tablet by mouth every 4 (four) hours as needed for pain.  oxymorphone (OPANA ER) 40 MG 12 hr tablet Take 40 mg by mouth every 12 (twelve) hours.    ranolazine (RANEXA) 1000 MG SR tablet Take 500 mg by mouth daily.    rosuvastatin (CRESTOR) 5 MG tablet Take 5 mg by mouth daily at 6 PM.       Allergies  Allergen Reactions  . Adipex-P [Phentermine]      Aggressive behavior  . Benadryl [Diphenhydramine Hcl]     Aggressive behavior  . Promethazine Hcl     Unknown - given for surgical procedure and advised to never take again  . Augmentin [Amoxicillin-Pot Clavulanate] Diarrhea and Nausea Only  . Toradol [Ketorolac Tromethamine] Hives    Redness    The results of significant diagnostics from this hospitalization (including imaging, microbiology, ancillary and laboratory) are listed below for reference.    Significant Diagnostic Studies: Dg Chest 2 View  11/18/2015  CLINICAL DATA:  Chest pain, shortness of breath since noon today. EXAM: CHEST  2 VIEW COMPARISON:  None. FINDINGS: Heart is normal size. Mediastinal contours are within limits. Coronary artery stents noted. Lungs are clear. No effusions. No acute bony abnormality. IMPRESSION: No active cardiopulmonary disease. Electronically Signed   By: Rolm Baptise M.D.   On: 11/18/2015 16:01   Nm Myocar Multi W/spect W/wall Motion / Ef  11/19/2015  CLINICAL DATA:  Chest pain. Prior angioplasty and stenting. Hypertension. Coronary artery disease. Gastroesophageal reflux disease. EXAM: MYOCARDIAL IMAGING WITH SPECT (REST AND PHARMACOLOGIC-STRESS) GATED LEFT VENTRICULAR WALL MOTION STUDY LEFT VENTRICULAR EJECTION FRACTION TECHNIQUE: Standard myocardial SPECT imaging was performed after resting intravenous injection of 10 mCi Tc-4m sestamibi. Subsequently, intravenous infusion of Lexiscan was performed under the supervision of the Cardiology staff. At peak effect of the drug, 30 mCi Tc-72m sestamibi was injected intravenously and standard myocardial SPECT imaging was performed. Quantitative gated imaging was also performed to evaluate left ventricular wall motion, and estimate left ventricular ejection fraction. COMPARISON:  11/18/2015 FINDINGS: Perfusion: Reduced activity in the inferior wall on stress and rest imaging likely due to diaphragmatic attenuation,, less likely from scarring in the inferior wall.  No inducible ischemia. Wall Motion: Normal left ventricular wall motion. No left ventricular dilation. Left Ventricular Ejection Fraction: 56 % End diastolic volume 0000000 ml End systolic volume 57 ml IMPRESSION: 1. Reduced inferior wall measured perfusion on stress and rest images, large size moderate severity, probably from diaphragmatic attenuation, less likely due to scar given the relatively normal wall motion and wall thickening in this vicinity. 2. Normal left ventricular wall motion. 3. Left ventricular ejection fraction 57% 4. Low-risk stress test findings*. *2012 Appropriate Use Criteria for Coronary Revascularization Focused Update: J Am Coll Cardiol. [id number]. http://content.airportbarriers.com.aspx?articleid=[email or web address] Electronically Signed   By: Van Clines M.D.   On: 11/19/2015 15:07   Labs: Basic Metabolic Panel:  Recent Labs Lab 11/18/15 1447  NA 141  K 4.0  CL 105  CO2 25  GLUCOSE 90  BUN 8  CREATININE 1.04  CALCIUM 9.6   CBC:  Recent Labs Lab 11/18/15 1447  WBC 8.9  HGB 14.1  HCT 41.8  MCV 95.0  PLT 314   Cardiac Enzymes:  Recent Labs Lab 11/18/15 2355 11/19/15 0420 11/19/15 1041  TROPONINI <0.03 <0.03 <0.03    Signed:  Barton Dubois MD.  [facility] 11/19/2015, 4:10 PM

## 2015-11-19 NOTE — Care Management Obs Status (Signed)
[facility] NOTIFICATION   Patient Details  Name: Nicholas Resendes MRN: [id number] Date of Birth: 12/03/1965   Medicare Observation Status Notification Given:  Yes    Bethena Roys, RN 11/19/2015, 12:19 PM

## 2015-11-19 NOTE — Progress Notes (Signed)
    Subjective:  No chest pain  Objective:  Vital Signs in the last 24 hours: Temp:  [97.7 F (36.5 C)-98.6 F (37 C)] 98.5 F (36.9 C) (01/11 0415) Pulse Rate:  [69-96] 96 (01/11 0920) Resp:  [12-23] 14 (01/11 0740) BP: (86-168)/(56-107) 114/66 mmHg (01/11 0920) SpO2:  [91 %-100 %] 91 % (01/11 0740) Weight:  [160 lb (72.576 kg)-164 lb 3.2 oz (74.481 kg)] 160 lb (72.576 kg) (01/11 0415)  Intake/Output from previous day: No intake or output data in the 24 hours ending 11/19/15 0934  Physical Exam: General appearance: alert, cooperative and no distress Lungs: decreased c/w COPD Heart: regular rate and rhythm Extremities: Lt BKA   Rate: 96  Rhythm: normal sinus rhythm  Lab Results:  Recent Labs  11/18/15 1447  WBC 8.9  HGB 14.1  PLT 314    Recent Labs  11/18/15 1447  NA 141  K 4.0  CL 105  CO2 25  GLUCOSE 90  BUN 8  CREATININE 1.04    Recent Labs  11/18/15 2355 11/19/15 0420  TROPONINI <0.03 <0.03   No results for input(s): INR in the last 72 hours.  Scheduled Meds: . ALPRAZolam  1 mg Oral TID  . clopidogrel  75 mg Oral Daily  . enoxaparin (LOVENOX) injection  40 mg Subcutaneous Daily  . nebivolol  10 mg Oral Daily  . oxymorphone  40 mg Oral Q12H  . ranolazine  500 mg Oral Daily  . regadenoson      . rosuvastatin  5 mg Oral q1800   Continuous Infusions:  PRN Meds:.acetaminophen, morphine injection, nitroGLYCERIN, ondansetron (ZOFRAN) IV, oxyCODONE-acetaminophen **AND** oxyCODONE   Imaging: Dg Chest 2 View  11/18/2015  CLINICAL DATA:  Chest pain, shortness of breath since noon today. EXAM: CHEST  2 VIEW COMPARISON:  None. FINDINGS: Heart is normal size. Mediastinal contours are within limits. Coronary artery stents noted. Lungs are clear. No effusions. No acute bony abnormality. IMPRESSION: No active cardiopulmonary disease. Electronically Signed   By: Rolm Baptise M.D.   On: 11/18/2015 16:01    Cardiac Studies:  Assessment/Plan:  50 y/o  male, from [place], with reported long h/o CAD presenting to ED 11/18/15 with CP c/w prior angina. He has received cardiac care are both in [place] and [place]. He has had multiple MIs and strokes. His last intervention was in June 2015 and he underwent stenting to his RCA and LCx. Also with a h/o PAF, tobacco abuse, HTN and left BKA, now with a prosthesis. He reports that he has to report for prison on 12/01/15 for 10 months.  Principal Problem:   Chest pain Active Problems:   CAD S/P - s/p RCA/ CFX   HLD (hyperlipidemia)   Essential hypertension   Tobacco abuse   PLAN: Myoview this am.  Kerin Ransom PA-C 11/19/2015, 9:34 AM [phone]  Agree with note written by Kerin Ransom [facility]  H/O known CAD. Admitted with CP. No further CP. Exam benign. Enz neg. Had MV this AM. If neg can be D/cd. Sched for 10 months incarceration in several weeks.  Quay Burow 11/19/2015 10:54 AM

## 2015-11-21 ENCOUNTER — Encounter (HOSPITAL_COMMUNITY): Payer: Self-pay | Admitting: Family Medicine

## 2015-11-21 ENCOUNTER — Observation Stay (HOSPITAL_COMMUNITY)
Admission: EM | Admit: 2015-11-21 | Discharge: 2015-11-22 | Disposition: A | Discharge status: Home / Self Care | Payer: Medicare Other | Attending: Internal Medicine | Admitting: Internal Medicine

## 2015-11-21 ENCOUNTER — Emergency Department (HOSPITAL_COMMUNITY): Payer: Medicare Other

## 2015-11-21 DIAGNOSIS — F419 Anxiety disorder, unspecified: Secondary | ICD-10-CM | POA: Insufficient documentation

## 2015-11-21 DIAGNOSIS — F988 Other specified behavioral and emotional disorders with onset usually occurring in childhood and adolescence: Secondary | ICD-10-CM | POA: Insufficient documentation

## 2015-11-21 DIAGNOSIS — Z7902 Long term (current) use of antithrombotics/antiplatelets: Secondary | ICD-10-CM | POA: Insufficient documentation

## 2015-11-21 DIAGNOSIS — Z955 Presence of coronary angioplasty implant and graft: Secondary | ICD-10-CM | POA: Diagnosis not present

## 2015-11-21 DIAGNOSIS — Z86711 Personal history of pulmonary embolism: Secondary | ICD-10-CM | POA: Insufficient documentation

## 2015-11-21 DIAGNOSIS — I252 Old myocardial infarction: Secondary | ICD-10-CM | POA: Insufficient documentation

## 2015-11-21 DIAGNOSIS — K219 Gastro-esophageal reflux disease without esophagitis: Secondary | ICD-10-CM | POA: Diagnosis not present

## 2015-11-21 DIAGNOSIS — F17200 Nicotine dependence, unspecified, uncomplicated: Secondary | ICD-10-CM | POA: Diagnosis present

## 2015-11-21 DIAGNOSIS — Z9861 Coronary angioplasty status: Secondary | ICD-10-CM

## 2015-11-21 DIAGNOSIS — I10 Essential (primary) hypertension: Secondary | ICD-10-CM | POA: Diagnosis not present

## 2015-11-21 DIAGNOSIS — R079 Chest pain, unspecified: Secondary | ICD-10-CM | POA: Diagnosis not present

## 2015-11-21 DIAGNOSIS — I2510 Atherosclerotic heart disease of native coronary artery without angina pectoris: Secondary | ICD-10-CM

## 2015-11-21 DIAGNOSIS — R0602 Shortness of breath: Secondary | ICD-10-CM | POA: Diagnosis not present

## 2015-11-21 DIAGNOSIS — G8929 Other chronic pain: Secondary | ICD-10-CM | POA: Insufficient documentation

## 2015-11-21 DIAGNOSIS — M545 Low back pain: Secondary | ICD-10-CM | POA: Diagnosis not present

## 2015-11-21 DIAGNOSIS — F4310 Post-traumatic stress disorder, unspecified: Secondary | ICD-10-CM | POA: Diagnosis not present

## 2015-11-21 DIAGNOSIS — E785 Hyperlipidemia, unspecified: Secondary | ICD-10-CM | POA: Diagnosis present

## 2015-11-21 DIAGNOSIS — Z79899 Other long term (current) drug therapy: Secondary | ICD-10-CM | POA: Insufficient documentation

## 2015-11-21 DIAGNOSIS — F17210 Nicotine dependence, cigarettes, uncomplicated: Secondary | ICD-10-CM | POA: Insufficient documentation

## 2015-11-21 LAB — BASIC METABOLIC PANEL
ANION GAP: 10 (ref 5–15)
BUN: 8 mg/dL (ref 6–20)
CHLORIDE: 107 mmol/L (ref 101–111)
CO2: 25 mmol/L (ref 22–32)
Calcium: 10 mg/dL (ref 8.9–10.3)
Creatinine, Ser: 1.02 mg/dL (ref 0.61–1.24)
Glucose, Bld: 121 mg/dL — ABNORMAL HIGH (ref 65–99)
POTASSIUM: 3.7 mmol/L (ref 3.5–5.1)
SODIUM: 142 mmol/L (ref 135–145)

## 2015-11-21 LAB — I-STAT TROPONIN, ED
Troponin i, poc: 0.03 ng/mL (ref 0.00–0.08)
Troponin i, poc: 0.04 ng/mL (ref 0.00–0.08)

## 2015-11-21 LAB — CBC
HEMATOCRIT: 40 % (ref 39.0–52.0)
Hemoglobin: 13.9 g/dL (ref 13.0–17.0)
MCH: 32.8 pg (ref 26.0–34.0)
MCHC: 34.8 g/dL (ref 30.0–36.0)
MCV: 94.3 fL (ref 78.0–100.0)
Platelets: 269 10*3/uL (ref 150–400)
RBC: 4.24 MIL/uL (ref 4.22–5.81)
RDW: 14 % (ref 11.5–15.5)
WBC: 8.7 10*3/uL (ref 4.0–10.5)

## 2015-11-21 LAB — D-DIMER, QUANTITATIVE (NOT AT ARMC): D DIMER QUANT: 1.02 ug{FEU}/mL — AB (ref 0.00–0.50)

## 2015-11-21 LAB — BRAIN NATRIURETIC PEPTIDE: B NATRIURETIC PEPTIDE 5: 40.4 pg/mL (ref 0.0–100.0)

## 2015-11-21 MED ORDER — HYDROMORPHONE HCL 1 MG/ML IJ SOLN
1.0000 mg | Freq: Once | INTRAMUSCULAR | Status: AC
  Administered 2015-11-21: 1 mg via INTRAVENOUS
  Filled 2015-11-21: qty 1

## 2015-11-21 NOTE — H&P (Signed)
History and Physical  Aaron Dodson [id number] DOB: June 28, 1966 DOA: 11/21/2015   PCP: Pcp Not In System  Referring Physician: ED/ Dr. Gareth Morgan  Chief Complaint: chest pain  HPI:  50 year old male with a history of CAD status post stent, hypertension, hyperlipidemia, PE presented to emergency department with complaints of chest pain. The patient was recently discharged from the hospital on 11/19/2015 after Myoview which was low risk with EF 56%. He states that he chronically has chest discomfort which has been worse recently occasionally with exertion, but also worsens at rest as well as with coughing.  The patient states that he has chest pain even at rest with retrosternal and pressure-like occasionally radiating to his neck. Sublingual NTG eases the chest discomfort but does not completely relieve it. In the emergency department, the patient was afebrile and hemodynamically stable. BMP and CBC were essentially unremarkable. BNP was 40. EKG shows sinus rhythm without ST-T wave changes per chest x-ray was negative. Assessment/Plan: Chest pain/Angina -Patient has typical and atypical components -reports CAD with multiple MIs and PCIs at outside hospital -wants w/u to r/o new ischemia prior to being detained (prison for 10 months starting 12/01/15) -cycle troponins -cardiology has been consulted to see him in ED -HEART score = 4 -continue plavix -pt reports RCA and circumflex stenting performed by [facility] -EKG in ED without ST-T changes -continue Ranexa -11/18/2015 d-dimer negative -In reviewing his medical record, it appears that the patient frequently changes his historical events HTN -continue bystolic Chronic Pain -continue home Opana and percocet -will not give IV opioids unless there is objective evidence of worsening pain -11/18/15 UDS neg Hyperlipidemia  -Continue Crestor  Anxiety  -Continue Xanax          Past Medical History  Diagnosis Date  .  Coronary artery disease   . Hypertension   . PE (pulmonary embolism) ~ 2007?; 09/2015  . Hyperlipidemia   . Anginal pain ([place])   . PTSD (post-traumatic stress disorder)   . Myocardial infarction [facility]) 2000's - ~ 04/2015    "I've had a total of 3" (11/18/2015)  . GERD (gastroesophageal reflux disease)   . Stroke [facility]) ~ 2006    denies residual on 11/18/2015  . Chronic lower back pain   . Anxiety   . ADD (attention deficit disorder)    Past Surgical History  Procedure Laterality Date  . Tonsillectomy    . Laparoscopic cholecystectomy    . Cardiac catheterization  "several"  . Coronary angioplasty  "several"  . Coronary angioplasty with stent placement  "several"    "total of 10 stents placed" (11/18/2015)  . 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 Above knee leg amputation Left   . Orthopedic surgery  Sep 17, 1967-~ 2007    "total of 52 on my left leg; started w/club foot released"   Social History:  reports that he has been smoking Cigarettes.  He has a 52.5 pack-year smoking history. He has never used smokeless tobacco. He reports that he uses illicit drugs. He reports that he does not drink alcohol.   Family History  Problem Relation Age of Onset  . Hypertension Father   . Lung cancer Father      Allergies  Allergen Reactions  . Adipex-P [Phentermine]     Aggressive behavior  . Benadryl [Diphenhydramine Hcl]     Aggressive behavior  . Promethazine Hcl     Unknown - given for surgical procedure and advised to never take again  .  Augmentin [Amoxicillin-Pot Clavulanate] Diarrhea and Nausea Only  . Toradol [Ketorolac Tromethamine] Hives    Redness      Prior to Admission medications   Medication Sig Start Date End Date Taking? Authorizing Provider  ALPRAZolam Duanne Moron) 1 MG tablet Take 1 mg by mouth 3 (three) times daily.   Yes Historical Provider, MD  clopidogrel (PLAVIX) 75 MG tablet Take 75 mg by mouth daily.   Yes Historical Provider, MD  nebivolol (BYSTOLIC) 10 MG tablet  Take 10 mg by mouth daily.   Yes Historical Provider, MD  oxyCODONE-acetaminophen (PERCOCET) 10-325 MG tablet Take 1 tablet by mouth every 4 (four) hours as needed for pain.   Yes Historical Provider, MD  oxymorphone (OPANA ER) 40 MG 12 hr tablet Take 40 mg by mouth every 12 (twelve) hours.   Yes Historical Provider, MD  ranolazine (RANEXA) 1000 MG SR tablet Take 500 mg by mouth daily.   Yes Historical Provider, MD  rosuvastatin (CRESTOR) 5 MG tablet Take 5 mg by mouth daily at 6 PM.   Yes Historical Provider, MD    Review of Systems:  Constitutional:  No weight loss, night sweats, Fevers, chills, fatigue.  Head&Eyes: No headache.  No vision loss.  No eye pain or scotoma ENT:  No Difficulty swallowing,Tooth/dental problems,Sore throat,  No ear ache, post nasal drip,  Cardio-vascular:  No  Orthopnea, PND, swelling in lower extremities,  dizziness, palpitations  GI:  No  abdominal pain, nausea, vomiting, diarrhea, loss of appetite, hematochezia, melena, heartburn, indigestion, Resp:  No  No cough. No coughing up of blood .No wheezing.No chest wall deformity  Skin:  no rash or lesions.  GU:  no dysuria, change in color of urine, no urgency or frequency. No flank pain.  Musculoskeletal:  No joint pain or swelling. No decreased range of motion. No back pain.  Psych:  No change in mood or affect. No depression or anxiety. Neurologic: No headache, no dysesthesia, no focal weakness, no vision loss. No syncope  Physical Exam: Filed Vitals:   11/21/15 2149 11/21/15 2215 11/21/15 2230 11/21/15 2245  BP: 135/85 119/81 133/87 121/83  Pulse: 87 80 89 82  Temp:      TempSrc:      Resp: 18 28 20 13   SpO2: 100% 100% 100% 100%   General:  A&O x 3, NAD, nontoxic, pleasant/cooperative Head/Eye: No conjunctival hemorrhage, no icterus, [place]/AT, No nystagmus ENT:  No icterus,  No thrush, good dentition, no pharyngeal exudate Neck:  No masses, no lymphadenpathy, no bruits CV:  RRR, no rub, no  gallop, no S3 Lung:  CTAB, good air movement, no wheeze, no rhonchi Abdomen: soft/NT, +BS, nondistended, no peritoneal signs Ext: No cyanosis, No rashes, No petechiae, No lymphangitis, No edema; L-AKA with prosthesis intact   Labs on Admission:  Basic Metabolic Panel:  Recent Labs Lab 11/18/15 1447 11/21/15 1854  NA 141 142  K 4.0 3.7  CL 105 107  CO2 25 25  GLUCOSE 90 121*  BUN 8 8  CREATININE 1.04 1.02  CALCIUM 9.6 10.0   Liver Function Tests: No results for input(s): AST, ALT, ALKPHOS, BILITOT, PROT, ALBUMIN in the last 168 hours. No results for input(s): LIPASE, AMYLASE in the last 168 hours. No results for input(s): AMMONIA in the last 168 hours. CBC:  Recent Labs Lab 11/18/15 1447 11/21/15 1854  WBC 8.9 8.7  HGB 14.1 13.9  HCT 41.8 40.0  MCV 95.0 94.3  PLT 314 269   Cardiac Enzymes:  Recent Labs  Lab 11/18/15 2355 11/19/15 0420 11/19/15 1041  TROPONINI <0.03 <0.03 <0.03   BNP: Invalid input(s): POCBNP CBG: No results for input(s): GLUCAP in the last 168 hours.  Radiological Exams on Admission: Dg Chest 2 View  11/21/2015  CLINICAL DATA:  Acute chest pain and shortness of breath. EXAM: CHEST  2 VIEW COMPARISON:  November 18, 2015. FINDINGS: The heart size and mediastinal contours are within normal limits. Both lungs are clear. No pneumothorax or pleural effusion is noted. The visualized skeletal structures are unremarkable. IMPRESSION: No active cardiopulmonary disease. Electronically Signed   By: Marijo Conception, M.D.   On: 11/21/2015 19:50    EKG: Independently reviewed. Sinus, no ST-T abnormalities    Time spent:60 minutes Code Status:   FULL Family Communication:   No Family at bedside   Mireya Meditz, DO  [facility] Pager [phone]  If 7PM-7AM, please contact night-coverage [email or web address] Password [facility] 11/22/2015, 12:17 AM

## 2015-11-21 NOTE — ED Notes (Signed)
Pt here for continued chest pain and SOB after being released from the hospital 2 days ago.

## 2015-11-21 NOTE — ED Notes (Signed)
MD at bedside. 

## 2015-11-22 ENCOUNTER — Encounter (HOSPITAL_COMMUNITY): Payer: Self-pay | Admitting: Radiology

## 2015-11-22 ENCOUNTER — Observation Stay (HOSPITAL_COMMUNITY): Payer: Medicare Other

## 2015-11-22 DIAGNOSIS — E785 Hyperlipidemia, unspecified: Secondary | ICD-10-CM

## 2015-11-22 DIAGNOSIS — R079 Chest pain, unspecified: Secondary | ICD-10-CM | POA: Diagnosis not present

## 2015-11-22 DIAGNOSIS — Z720 Tobacco use: Secondary | ICD-10-CM | POA: Diagnosis not present

## 2015-11-22 DIAGNOSIS — I2510 Atherosclerotic heart disease of native coronary artery without angina pectoris: Secondary | ICD-10-CM

## 2015-11-22 DIAGNOSIS — I10 Essential (primary) hypertension: Secondary | ICD-10-CM

## 2015-11-22 DIAGNOSIS — R072 Precordial pain: Secondary | ICD-10-CM | POA: Diagnosis not present

## 2015-11-22 DIAGNOSIS — Z9861 Coronary angioplasty status: Secondary | ICD-10-CM

## 2015-11-22 LAB — TROPONIN I: Troponin I: 0.06 ng/mL — ABNORMAL HIGH (ref <0.031)

## 2015-11-22 MED ORDER — RANOLAZINE ER 500 MG PO TB12
500.0000 mg | ORAL_TABLET | Freq: Every day | ORAL | Status: DC
  Administered 2015-11-22: 500 mg via ORAL
  Filled 2015-11-22: qty 1

## 2015-11-22 MED ORDER — NEBIVOLOL HCL 5 MG PO TABS: 10.0000 mg | ORAL_TABLET | Freq: Every day | ORAL | Status: DC

## 2015-11-22 MED ORDER — PANTOPRAZOLE SODIUM 40 MG PO TBEC: 40.0000 mg | DELAYED_RELEASE_TABLET | Freq: Every day | ORAL | Status: DC

## 2015-11-22 MED ORDER — OXYCODONE-ACETAMINOPHEN 10-325 MG PO TABS: 1.0000 | ORAL_TABLET | ORAL | Status: DC | PRN

## 2015-11-22 MED ORDER — ROSUVASTATIN CALCIUM 5 MG PO TABS: 5.0000 mg | ORAL_TABLET | Freq: Every day | ORAL | Status: DC

## 2015-11-22 MED ORDER — ALPRAZOLAM 0.5 MG PO TABS
1.0000 mg | ORAL_TABLET | Freq: Three times a day (TID) | ORAL | Status: DC
  Administered 2015-11-22: 1 mg via ORAL
  Filled 2015-11-22: qty 2

## 2015-11-22 MED ORDER — PANTOPRAZOLE SODIUM 40 MG PO TBEC
40.0000 mg | DELAYED_RELEASE_TABLET | Freq: Every day | ORAL | Status: DC
  Administered 2015-11-22: 40 mg via ORAL

## 2015-11-22 MED ORDER — IOHEXOL 350 MG/ML SOLN
80.0000 mL | Freq: Once | INTRAVENOUS | Status: AC | PRN
  Administered 2015-11-22: 100 mL via INTRAVENOUS

## 2015-11-22 MED ORDER — ACETAMINOPHEN 325 MG PO TABS: 650.0000 mg | ORAL_TABLET | ORAL | Status: DC | PRN

## 2015-11-22 MED ORDER — OXYCODONE-ACETAMINOPHEN 5-325 MG PO TABS
1.0000 | ORAL_TABLET | ORAL | Status: DC | PRN
  Administered 2015-11-22: 1 via ORAL
  Filled 2015-11-22: qty 1

## 2015-11-22 MED ORDER — NICOTINE 21 MG/24HR TD PT24
21.0000 mg | MEDICATED_PATCH | Freq: Every day | TRANSDERMAL | Status: DC
  Administered 2015-11-22: 21 mg via TRANSDERMAL
  Filled 2015-11-22 (×2): qty 1

## 2015-11-22 MED ORDER — ENOXAPARIN SODIUM 40 MG/0.4ML ~~LOC~~ SOLN
40.0000 mg | SUBCUTANEOUS | Status: DC
  Filled 2015-11-22: qty 0.4

## 2015-11-22 MED ORDER — RANOLAZINE ER 500 MG PO TB12
500.0000 mg | ORAL_TABLET | Freq: Every day | ORAL | Status: DC
Start: 2015-11-22 — End: 2016-06-14

## 2015-11-22 MED ORDER — MORPHINE SULFATE ER 100 MG PO TBCR
100.0000 mg | EXTENDED_RELEASE_TABLET | Freq: Two times a day (BID) | ORAL | Status: DC
  Administered 2015-11-22 (×2): 100 mg via ORAL
  Filled 2015-11-22 (×2): qty 1

## 2015-11-22 MED ORDER — OXYCODONE HCL 5 MG PO TABS
5.0000 mg | ORAL_TABLET | ORAL | Status: DC | PRN
  Administered 2015-11-22: 5 mg via ORAL
  Filled 2015-11-22: qty 1

## 2015-11-22 MED ORDER — ONDANSETRON HCL 4 MG/2ML IJ SOLN: 4.0000 mg | Freq: Four times a day (QID) | INTRAMUSCULAR | Status: DC | PRN

## 2015-11-22 MED ORDER — NEBIVOLOL HCL 10 MG PO TABS: 10.0000 mg | ORAL_TABLET | Freq: Every day | ORAL | Status: DC

## 2015-11-22 MED ORDER — ROSUVASTATIN CALCIUM 5 MG PO TABS
5.0000 mg | ORAL_TABLET | Freq: Every day | ORAL | Status: DC
  Filled 2015-11-22: qty 1

## 2015-11-22 MED ORDER — CLOPIDOGREL BISULFATE 75 MG PO TABS
75.0000 mg | ORAL_TABLET | Freq: Every day | ORAL | Status: DC
  Administered 2015-11-22: 75 mg via ORAL
  Filled 2015-11-22: qty 1

## 2015-11-22 MED ORDER — CLOPIDOGREL BISULFATE 75 MG PO TABS: 75.0000 mg | ORAL_TABLET | Freq: Every day | ORAL | Status: DC

## 2015-11-22 NOTE — ED Notes (Addendum)
Report has been called on the patient, pt being transported to CT and then he will go to the floor

## 2015-11-22 NOTE — Discharge Summary (Signed)
Physician Discharge Summary  Urbain Eiler [id number] DOB: 1966/02/08 DOA: 11/21/2015  PCP: Pcp Not In System  Admit date: 11/21/2015 Discharge date: 11/22/2015  Time spent: 35 minutes  Recommendations for Outpatient Follow-up:  1. Still awaiting records from 3 outside facilities-- [facility], [facility] and [facility] 2. Patient says his pain meds are "locked" in a facility in [place] and he can not get 3. All cardiac med prescriptions were given   Discharge Diagnoses:  Active Problems:   Chest pain   HLD (hyperlipidemia)   Essential hypertension   Tobacco abuse   CAD S/P - s/p RCA/ CFX   Discharge Condition: improved  Diet recommendation: cardiac  Filed Weights   11/22/15 0253  Weight: 73.6 kg (162 lb 4.1 oz)    History of present illness:  50 year old male with a history of CAD status post stent, hypertension, hyperlipidemia, PE presented to emergency department with complaints of chest pain. The patient was recently discharged from the hospital on 11/19/2015 after Myoview which was low risk with EF 56%. He states that he chronically has chest discomfort which has been worse recently occasionally with exertion, but also worsens at rest as well as with coughing. The patient states that he has chest pain even at rest with retrosternal and pressure-like occasionally radiating to his neck. Sublingual NTG eases the chest discomfort but does not completely relieve it. In the emergency department, the patient was afebrile and hemodynamically stable. BMP and CBC were essentially unremarkable. BNP was 40. EKG shows sinus rhythm without ST-T wave changes per chest x-ray was negative.  Hospital Course:  Patient's story changes with each interview and changes hospitals where his treatment was done as well-- [facility] to [facility] in [place].  Unable to pull care everywhere for [facility] as it says he was never there Patient's drug screen negative for  medications he is says he is on-- now says meds are in storage in [facility] with cardiology-- no plan for cath- atypical -added protonix Await records from other facilities (if he was ever a patient there) -about to go to [place]  Procedures:    Consultations:  cards  Discharge Exam: Filed Vitals:   11/22/15 0145 11/22/15 0256  BP: 117/89 134/77  Pulse: 66 79  Temp:  97.9 F (36.6 C)  Resp: 22       Discharge Instructions    Current Discharge Medication List    START taking these medications   Details  pantoprazole (PROTONIX) 40 MG tablet Take 1 tablet (40 mg total) by mouth daily. Qty: 30 tablet, Refills: 0      CONTINUE these medications which have CHANGED   Details  clopidogrel (PLAVIX) 75 MG tablet Take 1 tablet (75 mg total) by mouth daily. Qty: 30 tablet, Refills: 0    nebivolol (BYSTOLIC) 10 MG tablet Take 1 tablet (10 mg total) by mouth daily. Qty: 30 tablet, Refills: 0    ranolazine (RANEXA) 500 MG 12 hr tablet Take 1 tablet (500 mg total) by mouth daily. Qty: 30 each, Refills: 0    rosuvastatin (CRESTOR) 5 MG tablet Take 1 tablet (5 mg total) by mouth daily at 6 PM. Qty: 30 tablet, Refills: 0      CONTINUE these medications which have NOT CHANGED   Details  ALPRAZolam (XANAX) 1 MG tablet Take 1 mg by mouth 3 (three) times daily.    oxyCODONE-acetaminophen (PERCOCET) 10-325 MG tablet Take 1 tablet by mouth every 4 (four)  hours as needed for pain.    oxymorphone (OPANA ER) 40 MG 12 hr tablet Take 40 mg by mouth every 12 (twelve) hours.       Allergies  Allergen Reactions  . Adipex-P [Phentermine]     Aggressive behavior  . Benadryl [Diphenhydramine Hcl]     Aggressive behavior  . Promethazine Hcl     Unknown - given for surgical procedure and advised to never take again  . Augmentin [Amoxicillin-Pot Clavulanate] Diarrhea and Nausea Only  . Toradol [Ketorolac Tromethamine] Hives    Redness   Follow-up Information    Please follow up.    Why:  patient to establish with PCP       The results of significant diagnostics from this hospitalization (including imaging, microbiology, ancillary and laboratory) are listed below for reference.    Significant Diagnostic Studies: Dg Chest 2 View  11/21/2015  CLINICAL DATA:  Acute chest pain and shortness of breath. EXAM: CHEST  2 VIEW COMPARISON:  November 18, 2015. FINDINGS: The heart size and mediastinal contours are within normal limits. Both lungs are clear. No pneumothorax or pleural effusion is noted. The visualized skeletal structures are unremarkable. IMPRESSION: No active cardiopulmonary disease. Electronically Signed   By: Marijo Conception, M.D.   On: 11/21/2015 19:50   Dg Chest 2 View  11/18/2015  CLINICAL DATA:  Chest pain, shortness of breath since noon today. EXAM: CHEST  2 VIEW COMPARISON:  None. FINDINGS: Heart is normal size. Mediastinal contours are within limits. Coronary artery stents noted. Lungs are clear. No effusions. No acute bony abnormality. IMPRESSION: No active cardiopulmonary disease. Electronically Signed   By: Rolm Baptise M.D.   On: 11/18/2015 16:01   Ct Angio Chest Pe W/cm &/or Wo Cm  11/22/2015  CLINICAL DATA:  Acute onset of generalized chest pain and cough. Pressure radiates to the neck. Initial encounter. EXAM: CT ANGIOGRAPHY CHEST WITH CONTRAST TECHNIQUE: Multidetector CT imaging of the chest was performed using the standard protocol during bolus administration of intravenous contrast. Multiplanar CT image reconstructions and MIPs were obtained to evaluate the vascular anatomy. CONTRAST:  124mL OMNIPAQUE IOHEXOL 350 MG/ML SOLN COMPARISON:  Chest radiograph performed 11/21/2015 FINDINGS: There is no evidence of pulmonary embolus. Minimal right basilar atelectasis is noted. Bilateral emphysematous change is noted, most prominent at the upper lung lobes. The lungs are otherwise grossly clear. There is no evidence of significant focal consolidation, pleural  effusion or pneumothorax. No masses are identified; no abnormal focal contrast enhancement is seen. Diffuse coronary artery calcifications are seen. The mediastinum is otherwise unremarkable in appearance. No pericardial effusion is identified. The great vessels are grossly unremarkable. No axillary lymphadenopathy is seen. The visualized portions of the thyroid gland are unremarkable in appearance. The visualized portions of the liver and spleen are unremarkable. No acute osseous abnormalities are seen. Review of the MIP images confirms the above findings. IMPRESSION: 1. No evidence of pulmonary embolus. 2. Minimal right basilar atelectasis noted. Bilateral emphysematous change, most prominent at the upper lung lobes. 3. Diffuse coronary artery calcifications seen. Electronically Signed   By: Garald Balding M.D.   On: 11/22/2015 03:06   Nm Myocar Multi W/spect W/wall Motion / Ef  11/19/2015  CLINICAL DATA:  Chest pain. Prior angioplasty and stenting. Hypertension. Coronary artery disease. Gastroesophageal reflux disease. EXAM: MYOCARDIAL IMAGING WITH SPECT (REST AND PHARMACOLOGIC-STRESS) GATED LEFT VENTRICULAR WALL MOTION STUDY LEFT VENTRICULAR EJECTION FRACTION TECHNIQUE: Standard myocardial SPECT imaging was performed after resting intravenous injection of 10 mCi Tc-29m  sestamibi. Subsequently, intravenous infusion of Lexiscan was performed under the supervision of the Cardiology staff. At peak effect of the drug, 30 mCi Tc-75m sestamibi was injected intravenously and standard myocardial SPECT imaging was performed. Quantitative gated imaging was also performed to evaluate left ventricular wall motion, and estimate left ventricular ejection fraction. COMPARISON:  11/18/2015 FINDINGS: Perfusion: Reduced activity in the inferior wall on stress and rest imaging likely due to diaphragmatic attenuation,, less likely from scarring in the inferior wall. No inducible ischemia. Wall Motion: Normal left ventricular wall  motion. No left ventricular dilation. Left Ventricular Ejection Fraction: 56 % End diastolic volume 0000000 ml End systolic volume 57 ml IMPRESSION: 1. Reduced inferior wall measured perfusion on stress and rest images, large size moderate severity, probably from diaphragmatic attenuation, less likely due to scar given the relatively normal wall motion and wall thickening in this vicinity. 2. Normal left ventricular wall motion. 3. Left ventricular ejection fraction 57% 4. Low-risk stress test findings*. *2012 Appropriate Use Criteria for Coronary Revascularization Focused Update: J Am Coll Cardiol. [id number]. http://content.airportbarriers.com.aspx?articleid=[email or web address] Electronically Signed   By: Van Clines M.D.   On: 11/19/2015 15:07    Microbiology: No results found for this or any previous visit (from the past 240 hour(s)).   Labs: Basic Metabolic Panel:  Recent Labs Lab 11/18/15 1447 11/21/15 1854  NA 141 142  K 4.0 3.7  CL 105 107  CO2 25 25  GLUCOSE 90 121*  BUN 8 8  CREATININE 1.04 1.02  CALCIUM 9.6 10.0   Liver Function Tests: No results for input(s): AST, ALT, ALKPHOS, BILITOT, PROT, ALBUMIN in the last 168 hours. No results for input(s): LIPASE, AMYLASE in the last 168 hours. No results for input(s): AMMONIA in the last 168 hours. CBC:  Recent Labs Lab 11/18/15 1447 11/21/15 1854  WBC 8.9 8.7  HGB 14.1 13.9  HCT 41.8 40.0  MCV 95.0 94.3  PLT 314 269   Cardiac Enzymes:  Recent Labs Lab 11/18/15 2355 11/19/15 0420 11/19/15 1041  TROPONINI <0.03 <0.03 <0.03   BNP: BNP (last 3 results)  Recent Labs  11/21/15 1854  BNP 40.4    ProBNP (last 3 results) No results for input(s): PROBNP in the last 8760 hours.  CBG: No results for input(s): GLUCAP in the last 168 hours.     Signed:  Geradine Girt MD.  [facility] 11/22/2015, 11:30 AM

## 2015-11-22 NOTE — Progress Notes (Signed)
Pt had 2 loose pills he gave to RN and stated they were xanax.  RN unable to identify pills.  RN wasted these in sharps and witnessed by Ly, RN.  Pt states he has no more medications on him.  Will cont to monitor pt closely.  Claudette Stapler, RN

## 2015-11-22 NOTE — Progress Notes (Signed)
Patient D/C'd to home with wife. Telemetry D/C'd and no IV present.  D/C instructions reviewed with patient and he states "no questions" about instructions.

## 2015-11-22 NOTE — Progress Notes (Signed)
Patient wants stronger pain medication and was falling asleep while I was talking to him and he was writing his name. Spoke with Eliseo Squires, MD to make aware no new orders. Anamarie Hunn Doree Fudge 2:25 PM

## 2015-11-22 NOTE — Consult Note (Addendum)
Referring Physician: ER   Reason for Consultation: CP   HPI:  50 yo [place] man, smoker, prior IVDU, reports history of CAD and prior stents in RCA and LCX in [place] with last stent in summer of 2016, was recently admitted on 11/17/14 for left CP, ruled out for MI and had unremarkable Lexiscan present with L CP. Says CP is constant for past many days, never resolved although intensity fluctuates,a increases with deep breathing and direct palpation. No syncope, orthopnea, PND, edema, diaphoresis.     Review of Systems:  10 systems reviewed unremarkable except as noted in HPI   Past Medical History  Diagnosis Date  . Coronary artery disease   . Hypertension   . PE (pulmonary embolism) ~ 2007?; 09/2015  . Hyperlipidemia   . Anginal pain ([place])   . PTSD (post-traumatic stress disorder)   . Myocardial infarction [facility] 2000[facility] - ~ 04/2015    "I've had a total of 3" (11/18/2015)  . GERD (gastroesophageal reflux disease)   . Stroke [facility]) ~ 2006    denies residual on 11/18/2015  . Chronic lower back pain   . Anxiety   . ADD (attention deficit disorder)      (Not in a hospital admission)  No current facility-administered medications on file prior to encounter.   Current Outpatient Prescriptions on File Prior to Encounter  Medication Sig Dispense Refill  . ALPRAZolam (XANAX) 1 MG tablet Take 1 mg by mouth 3 (three) times daily.    . clopidogrel (PLAVIX) 75 MG tablet Take 75 mg by mouth daily.    . nebivolol (BYSTOLIC) 10 MG tablet Take 10 mg by mouth daily.    Marland Kitchen oxyCODONE-acetaminophen (PERCOCET) 10-325 MG tablet Take 1 tablet by mouth every 4 (four) hours as needed for pain.    Marland Kitchen oxymorphone (OPANA ER) 40 MG 12 hr tablet Take 40 mg by mouth every 12 (twelve) hours.    . ranolazine (RANEXA) 1000 MG SR tablet Take 500 mg by mouth daily.    . rosuvastatin (CRESTOR) 5 MG tablet Take 5 mg by mouth daily at 6 PM.       Allergies  Allergen Reactions  . Adipex-P [Phentermine]    Aggressive behavior  . Benadryl [Diphenhydramine Hcl]     Aggressive behavior  . Promethazine Hcl     Unknown - given for surgical procedure and advised to never take again  . Augmentin [Amoxicillin-Pot Clavulanate] Diarrhea and Nausea Only  . Toradol [Ketorolac Tromethamine] Hives    Redness    Social History   Social History  . Marital Status: Divorced    Spouse Name: N/A  . Number of Children: N/A  . Years of Education: N/A   Occupational History  . Not on file.   Social History Main Topics  . Smoking status: Current Every Day Smoker -- 1.50 packs/day for 35 years    Types: Cigarettes  . Smokeless tobacco: Never Used  . Alcohol Use: No  . 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 Sexual Activity: Not Currently   Other Topics Concern  . Not on file   Social History Narrative    Family History  Problem Relation Age of Onset  . Hypertension Father   . Lung cancer Father     PHYSICAL EXAM: Filed Vitals:   11/21/15 2230 11/21/15 2245  BP: 133/87 121/83  Pulse: 89 82  Temp:  Resp: 20 13    No intake or output data in the 24 hours ending 11/22/15 0003  General:  Well appearing. No respiratory difficulty HEENT: normal Neck: supple. no JVD. Carotids 2+ bilat; no bruits. No lymphadenopathy or thryomegaly appreciated. Cor: PMI nondisplaced. Regular rate & rhythm. No rubs, gallops or murmurs; tenderness left parasternal region at 3rd costochondral joint area  Lungs: clear Abdomen: soft, nontender, nondistended. No hepatosplenomegaly. No bruits or masses. Good bowel sounds. Extremities: no cyanosis, clubbing, rash, edema Neuro: alert & oriented x 3, cranial nerves grossly intact. moves all 4 extremities w/o difficulty. Affect pleasant.  ECG: NSR, normal AV conduction, narrow QRS, no acute ST-T changes  Results for orders placed or performed during the hospital encounter of 11/21/15 (from the past 24 hour(s))  I-stat  troponin, ED (not at [facility], [facility])     Status: None   Collection Time: 11/21/15  6:52 PM  Result Value Ref Range   Troponin i, poc 0.04 0.00 - 0.08 ng/mL   Comment 3          Basic metabolic panel     Status: Abnormal   Collection Time: 11/21/15  6:54 PM  Result Value Ref Range   Sodium 142 135 - 145 mmol/L   Potassium 3.7 3.5 - 5.1 mmol/L   Chloride 107 101 - 111 mmol/L   CO2 25 22 - 32 mmol/L   Glucose, Bld 121 (H) 65 - 99 mg/dL   BUN 8 6 - 20 mg/dL   Creatinine, Ser 1.02 0.61 - 1.24 mg/dL   Calcium 10.0 8.9 - 10.3 mg/dL   GFR calc non Af Amer >60 >60 mL/min   GFR calc Af Amer >60 >60 mL/min   Anion gap 10 5 - 15  CBC     Status: None   Collection Time: 11/21/15  6:54 PM  Result Value Ref Range   WBC 8.7 4.0 - 10.5 K/uL   RBC 4.24 4.22 - 5.81 MIL/uL   Hemoglobin 13.9 13.0 - 17.0 g/dL   HCT 40.0 39.0 - 52.0 %   MCV 94.3 78.0 - 100.0 fL   MCH 32.8 26.0 - 34.0 pg   MCHC 34.8 30.0 - 36.0 g/dL   RDW 14.0 11.5 - 15.5 %   Platelets 269 150 - 400 K/uL  Brain natriuretic peptide     Status: None   Collection Time: 11/21/15  6:54 PM  Result Value Ref Range   B Natriuretic Peptide 40.4 0.0 - 100.0 pg/mL  D-dimer, quantitative (not at [facility])     Status: Abnormal   Collection Time: 11/21/15 11:14 PM  Result Value Ref Range   D-Dimer, Quant 1.02 (H) 0.00 - 0.50 ug/mL-FEU  I-Stat Troponin, ED (not at [facility])     Status: None   Collection Time: 11/21/15 11:38 PM  Result Value Ref Range   Troponin i, poc 0.03 0.00 - 0.08 ng/mL   Comment 3           Dg Chest 2 View  11/21/2015  CLINICAL DATA:  Acute chest pain and shortness of breath. EXAM: CHEST  2 VIEW COMPARISON:  November 18, 2015. FINDINGS: The heart size and mediastinal contours are within normal limits. Both lungs are clear. No pneumothorax or pleural effusion is noted. The visualized skeletal structures are unremarkable. IMPRESSION: No active cardiopulmonary disease. Electronically Signed   By: Marijo Conception, M.D.   On: 11/21/2015  19:50     ASSESSMENT:  1. Left sided CP, atypical, likely musculoskeletal (reproducible, increase  with deep breathing, constant for many days) - negative initial Trp; ECG not showing acute ischemia - Stable hemodynamics - No Acute HF - Recent admission when ruled out for MI and had a low risk Lexsican (normal wall motion and LVEF, no inducible ischemia) - Reports CAD with prior stents in RCA and LCX done in [place] (I don't have records available to review at this time)   2. Psychosocial issues (back with his ex wife, going to be incarcerated soon for 8-10 months for possessing stolen property which he claims that he bought a TV from some body and he did not known it was stolen)  3. Smoker 4. 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   Wandra Mannan, MD  Cardiology  Consult note reviewed. ECG and troponins are ok. Case discussed with Dr. Eliseo Squires.    CP is atypical. If no objective evidence of ischemia would treat medically and not recath at this point.   Alcee Sipos,MD 8:57 AM

## 2015-11-22 NOTE — ED Provider Notes (Signed)
CSN: [id number]     Arrival date & time 11/21/15  1826 History   First MD Initiated Contact with Patient 11/21/15 2154     Chief Complaint  Patient presents with  . Chest Pain     (Consider location/radiation/quality/duration/timing/severity/associated sxs/prior Treatment) Patient is a 50 y.o. male presenting with chest pain.  Chest Pain Associated symptoms: no abdominal pain, no cough, no fever, no headache, no nausea, no shortness of breath and not vomiting    50 yo CA man, smoker, prior IVDU, reports history of CAD and prior stents in RCA and LCX in [place] with last stent in summer of 2016, was recently admitted on 11/17/14 for left CP, ruled out for MI and had unremarkable Lexiscan present with L CP.   He has had mid to left sided pressure like chest pain constantly for the past week.  The pain has been associated with no other sx and has no alleviating/aggravating factors.  Patient notes that he is to be incarcerated next week and wants to get the etiology of his pain figured out before then.  He feels that the pain represents angina.  Past Medical History  Diagnosis Date  . Coronary artery disease   . Hypertension   . PE (pulmonary embolism) ~ 2007?; 09/2015  . Hyperlipidemia   . Anginal pain ([place])   . PTSD (post-traumatic stress disorder)   . Myocardial infarction [facility]) 2000's - ~ 04/2015    "I've had a total of 3" (11/18/2015)  . GERD (gastroesophageal reflux disease)   . Stroke [facility]) ~ 2006    denies residual on 11/18/2015  . Chronic lower back pain   . Anxiety   . ADD (attention deficit disorder)    Past Surgical History  Procedure Laterality Date  . Tonsillectomy    . Laparoscopic cholecystectomy    . Cardiac catheterization  "several"  . Coronary angioplasty  "several"  . Coronary angioplasty with stent placement  "several"    "total of 10 stents placed" (11/18/2015)  . 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 Above knee leg amputation Left   . Orthopedic surgery   Dec 02, 1966-~ 2007    "total of 52 on my left leg; started w/club foot released"   Family History  Problem Relation Age of Onset  . Hypertension Father   . Lung cancer Father    Social History  Substance Use Topics  . Smoking status: Current Every Day Smoker -- 1.50 packs/day for 35 years    Types: Cigarettes  . Smokeless tobacco: Never Used  . Alcohol Use: No    Review of Systems  Constitutional: Negative for fever and chills.  Eyes: Negative for redness.  Respiratory: Negative for cough and shortness of breath.   Cardiovascular: Positive for chest pain.  Gastrointestinal: Negative for nausea, vomiting, abdominal pain and diarrhea.  Genitourinary: Negative for dysuria.  Skin: Negative for rash.  Neurological: Negative for headaches.  All other systems reviewed and are negative.     Allergies  Adipex-p; Benadryl; Promethazine hcl; Augmentin; and Toradol  Home Medications   Prior to Admission medications   Medication Sig Start Date End Date Taking? Authorizing Provider  ALPRAZolam Duanne Moron) 1 MG tablet Take 1 mg by mouth 3 (three) times daily.   Yes Historical Provider, MD  oxyCODONE-acetaminophen (PERCOCET) 10-325 MG tablet Take 1 tablet by mouth every 4 (four) hours as needed for pain.   Yes Historical Provider, MD  oxymorphone (OPANA ER) 40 MG 12 hr tablet Take 40 mg by mouth  every 12 (twelve) hours.   Yes Historical Provider, MD  clopidogrel (PLAVIX) 75 MG tablet Take 1 tablet (75 mg total) by mouth daily. 11/22/15   Geradine Girt, DO  nebivolol (BYSTOLIC) 10 MG tablet Take 1 tablet (10 mg total) by mouth daily. 11/22/15   Geradine Girt, DO  pantoprazole (PROTONIX) 40 MG tablet Take 1 tablet (40 mg total) by mouth daily. 11/22/15   Geradine Girt, DO  ranolazine (RANEXA) 500 MG 12 hr tablet Take 1 tablet (500 mg total) by mouth daily. 11/22/15   Geradine Girt, DO  rosuvastatin (CRESTOR) 5 MG tablet Take 1 tablet (5 mg total) by mouth daily at 6 PM. 11/22/15   Tomi Bamberger Vann,  DO   BP 134/77 mmHg  Pulse 79  Temp(Src) 97.9 F (36.6 C) (Oral)  Resp 22  Ht 6' (1.829 m)  Wt 73.6 kg  BMI 22.00 kg/m2  SpO2 99% Physical Exam  Constitutional: He is oriented to person, place, and time. No distress.  HENT:  Head: Normocephalic and atraumatic.  Eyes: EOM are normal. Pupils are equal, round, and reactive to light.  Neck: Normal range of motion. Neck supple.  Cardiovascular: Normal rate and normal heart sounds.   Pulmonary/Chest: Effort normal. No respiratory distress.  Abdominal: Soft. There is no tenderness.  Musculoskeletal: Normal range of motion.  Neurological: He is alert and oriented to person, place, and time.  Skin: No rash noted. He is not diaphoretic.  Psychiatric: He has a normal mood and affect.    ED Course  Procedures (including critical care time) Labs Review Labs Reviewed  BASIC METABOLIC PANEL - Abnormal; Notable for the following:    Glucose, Bld 121 (*)    All other components within normal limits  D-DIMER, QUANTITATIVE (NOT AT [facility]) - Abnormal; Notable for the following:    D-Dimer, Quant 1.02 (*)    All other components within normal limits  CBC  BRAIN NATRIURETIC PEPTIDE  TROPONIN I  TROPONIN I  TROPONIN I  I-STAT TROPOININ, ED  Randolm Idol, ED    Imaging Review Dg Chest 2 View  11/21/2015  CLINICAL DATA:  Acute chest pain and shortness of breath. EXAM: CHEST  2 VIEW COMPARISON:  November 18, 2015. FINDINGS: The heart size and mediastinal contours are within normal limits. Both lungs are clear. No pneumothorax or pleural effusion is noted. The visualized skeletal structures are unremarkable. IMPRESSION: No active cardiopulmonary disease. Electronically Signed   By: Marijo Conception, M.D.   On: 11/21/2015 19:50   Ct Angio Chest Pe W/cm &/or Wo Cm  11/22/2015  CLINICAL DATA:  Acute onset of generalized chest pain and cough. Pressure radiates to the neck. Initial encounter. EXAM: CT ANGIOGRAPHY CHEST WITH CONTRAST TECHNIQUE:  Multidetector CT imaging of the chest was performed using the standard protocol during bolus administration of intravenous contrast. Multiplanar CT image reconstructions and MIPs were obtained to evaluate the vascular anatomy. CONTRAST:  178mL OMNIPAQUE IOHEXOL 350 MG/ML SOLN COMPARISON:  Chest radiograph performed 11/21/2015 FINDINGS: There is no evidence of pulmonary embolus. Minimal right basilar atelectasis is noted. Bilateral emphysematous change is noted, most prominent at the upper lung lobes. The lungs are otherwise grossly clear. There is no evidence of significant focal consolidation, pleural effusion or pneumothorax. No masses are identified; no abnormal focal contrast enhancement is seen. Diffuse coronary artery calcifications are seen. The mediastinum is otherwise unremarkable in appearance. No pericardial effusion is identified. The great vessels are grossly unremarkable. No axillary lymphadenopathy is  seen. The visualized portions of the thyroid gland are unremarkable in appearance. The visualized portions of the liver and spleen are unremarkable. No acute osseous abnormalities are seen. Review of the MIP images confirms the above findings. IMPRESSION: 1. No evidence of pulmonary embolus. 2. Minimal right basilar atelectasis noted. Bilateral emphysematous change, most prominent at the upper lung lobes. 3. Diffuse coronary artery calcifications seen. Electronically Signed   By: Garald Balding M.D.   On: 11/22/2015 03:06   I have personally reviewed and evaluated these images and lab results as part of my medical decision-making.   EKG Interpretation   Date/Time:  Friday November 21 2015 18:34:19 EST Ventricular Rate:  87 PR Interval:  138 QRS Duration: 84 QT Interval:  350 QTC Calculation: 421 R Axis:   72 Text Interpretation:  Normal sinus rhythm Normal ECG No significant change  since last tracing Confirmed by [place] ([id number]) on 11/21/2015  10:15:29 PM      MDM   Final  diagnoses:  None  1. Chest pain 2.  Hyperlipidemia 3.  Hypertension   MDM: 50 yo CA man, smoker, prior IVDU, reports history of CAD and prior stents in RCA and LCX in [place] with last stent in summer of 2016, was recently admitted on 11/17/14 for left CP, ruled out for MI and had unremarkable Lexiscan present with L CP that feels similar to his previous angina.  AFVSS. ekg w/o ischemic changes. Concern for unstable angina vs. Pe.  Doubt dissection given description of the pain.  Cbc/bmp unremarkable.  First trop neg.  CXR w/o PTX or other abnormality. Patient has reported h/o pe and dimer was obtained which was positive.  Have ordered cta pe study  I have spoken with cards who recommend admission for further evaluation.  Will admit to hospitalist service. No acute events in the ED    Jarome Matin, MD 11/22/15 [place], MD 11/22/15 1505

## 2015-11-25 ENCOUNTER — Encounter (HOSPITAL_COMMUNITY): Payer: Self-pay | Admitting: Emergency Medicine

## 2015-11-25 ENCOUNTER — Emergency Department (HOSPITAL_COMMUNITY): Payer: Medicare Other

## 2015-11-25 ENCOUNTER — Inpatient Hospital Stay (HOSPITAL_COMMUNITY)
Admission: EM | Admit: 2015-11-25 | Discharge: 2015-11-26 | DRG: 287 | Disposition: A | Discharge status: Home / Self Care | Payer: Medicare Other | Attending: Family Medicine | Admitting: Family Medicine

## 2015-11-25 DIAGNOSIS — I25110 Atherosclerotic heart disease of native coronary artery with unstable angina pectoris: Principal | ICD-10-CM | POA: Insufficient documentation

## 2015-11-25 DIAGNOSIS — R079 Chest pain, unspecified: Secondary | ICD-10-CM | POA: Diagnosis present

## 2015-11-25 DIAGNOSIS — Z79899 Other long term (current) drug therapy: Secondary | ICD-10-CM | POA: Diagnosis not present

## 2015-11-25 DIAGNOSIS — F419 Anxiety disorder, unspecified: Secondary | ICD-10-CM | POA: Diagnosis not present

## 2015-11-25 DIAGNOSIS — I2510 Atherosclerotic heart disease of native coronary artery without angina pectoris: Secondary | ICD-10-CM | POA: Diagnosis not present

## 2015-11-25 DIAGNOSIS — I10 Essential (primary) hypertension: Secondary | ICD-10-CM | POA: Diagnosis present

## 2015-11-25 DIAGNOSIS — I200 Unstable angina: Secondary | ICD-10-CM

## 2015-11-25 DIAGNOSIS — Z8673 Personal history of transient ischemic attack (TIA), and cerebral infarction without residual deficits: Secondary | ICD-10-CM | POA: Diagnosis not present

## 2015-11-25 DIAGNOSIS — F4310 Post-traumatic stress disorder, unspecified: Secondary | ICD-10-CM | POA: Diagnosis present

## 2015-11-25 DIAGNOSIS — F17210 Nicotine dependence, cigarettes, uncomplicated: Secondary | ICD-10-CM | POA: Diagnosis present

## 2015-11-25 DIAGNOSIS — I252 Old myocardial infarction: Secondary | ICD-10-CM | POA: Diagnosis not present

## 2015-11-25 DIAGNOSIS — Z86711 Personal history of pulmonary embolism: Secondary | ICD-10-CM | POA: Diagnosis not present

## 2015-11-25 DIAGNOSIS — Z955 Presence of coronary angioplasty implant and graft: Secondary | ICD-10-CM

## 2015-11-25 DIAGNOSIS — G8929 Other chronic pain: Secondary | ICD-10-CM | POA: Diagnosis present

## 2015-11-25 DIAGNOSIS — T82858A Stenosis of vascular prosthetic devices, implants and grafts, initial encounter: Secondary | ICD-10-CM | POA: Diagnosis not present

## 2015-11-25 DIAGNOSIS — F17200 Nicotine dependence, unspecified, uncomplicated: Secondary | ICD-10-CM | POA: Diagnosis present

## 2015-11-25 DIAGNOSIS — Z9861 Coronary angioplasty status: Secondary | ICD-10-CM

## 2015-11-25 DIAGNOSIS — Z7902 Long term (current) use of antithrombotics/antiplatelets: Secondary | ICD-10-CM

## 2015-11-25 DIAGNOSIS — E785 Hyperlipidemia, unspecified: Secondary | ICD-10-CM | POA: Diagnosis present

## 2015-11-25 DIAGNOSIS — F411 Generalized anxiety disorder: Secondary | ICD-10-CM | POA: Diagnosis not present

## 2015-11-25 LAB — CBC
HCT: 40.4 % (ref 39.0–52.0)
HEMOGLOBIN: 13.8 g/dL (ref 13.0–17.0)
MCH: 32.2 pg (ref 26.0–34.0)
MCHC: 34.2 g/dL (ref 30.0–36.0)
MCV: 94.4 fL (ref 78.0–100.0)
Platelets: 308 10*3/uL (ref 150–400)
RBC: 4.28 MIL/uL (ref 4.22–5.81)
RDW: 14.1 % (ref 11.5–15.5)
WBC: 7.8 10*3/uL (ref 4.0–10.5)

## 2015-11-25 LAB — BASIC METABOLIC PANEL
ANION GAP: 12 (ref 5–15)
BUN: 6 mg/dL (ref 6–20)
CALCIUM: 9.7 mg/dL (ref 8.9–10.3)
CHLORIDE: 104 mmol/L (ref 101–111)
CO2: 25 mmol/L (ref 22–32)
CREATININE: 0.99 mg/dL (ref 0.61–1.24)
GFR calc Af Amer: >60 mL/min (ref >60)
GFR calc non Af Amer: >60 mL/min (ref >60)
GLUCOSE: 103 mg/dL — AB (ref 65–99)
Potassium: 3.8 mmol/L (ref 3.5–5.1)
Sodium: 141 mmol/L (ref 135–145)

## 2015-11-25 LAB — I-STAT TROPONIN, ED
TROPONIN I, POC: 0 ng/mL (ref 0.00–0.08)
Troponin i, poc: 0 ng/mL (ref 0.00–0.08)

## 2015-11-25 MED ORDER — ASPIRIN 325 MG PO TABS
325.0000 mg | ORAL_TABLET | Freq: Once | ORAL | Status: AC
Start: 2015-11-26 — End: 2015-11-26
  Administered 2015-11-26: 325 mg via ORAL
  Filled 2015-11-25: qty 1

## 2015-11-25 MED ORDER — ALPRAZOLAM 0.5 MG PO TABS
0.5000 mg | ORAL_TABLET | Freq: Once | ORAL | Status: AC
Start: 2015-11-25 — End: 2015-11-26
  Administered 2015-11-26: 0.5 mg via ORAL
  Filled 2015-11-25: qty 1

## 2015-11-25 MED ORDER — MORPHINE SULFATE (PF) 4 MG/ML IV SOLN
4.0000 mg | Freq: Once | INTRAVENOUS | Status: AC
  Administered 2015-11-25: 4 mg via INTRAVENOUS
  Filled 2015-11-25: qty 1

## 2015-11-25 MED ORDER — ASPIRIN 81 MG PO CHEW
81.0000 mg | CHEWABLE_TABLET | Freq: Every day | ORAL | Status: DC
  Administered 2015-11-26: 81 mg via ORAL
  Filled 2015-11-25: qty 1

## 2015-11-25 MED ORDER — MORPHINE SULFATE (PF) 2 MG/ML IV SOLN
4.0000 mg | Freq: Once | INTRAVENOUS | Status: AC
  Administered 2015-11-26: 4 mg via INTRAVENOUS
  Filled 2015-11-25: qty 2

## 2015-11-25 NOTE — ED Provider Notes (Signed)
CSN: [id number]     Arrival date & time 11/25/15  1355 History   First MD Initiated Contact with Patient 11/25/15 1822     Chief Complaint  Patient presents with  . Chest Pain     (Consider location/radiation/quality/duration/timing/severity/associated sxs/prior Treatment) Patient is a 50 y.o. male presenting with chest pain. The history is provided by the patient.  Chest Pain Pain location:  L chest Pain quality: pressure   Pain radiates to:  L shoulder Pain radiates to the back: no   Pain severity:  Moderate Onset quality:  Sudden Duration: pain comes on suddenly and last a short period. Timing:  Intermittent Progression:  Waxing and waning Chronicity:  Chronic Context: breathing, movement and stress   Context: no trauma   Relieved by:  Nothing Worsened by:  Nothing tried Ineffective treatments:  None tried Associated symptoms: anxiety   Associated symptoms: no abdominal pain, no altered mental status, no cough, no fever, no nausea, no shortness of breath and not vomiting   Risk factors: coronary artery disease     Past Medical History  Diagnosis Date  . Coronary artery disease   . Hypertension   . PE (pulmonary embolism) ~ 2007?; 09/2015  . Hyperlipidemia   . Anginal pain ([place])   . PTSD (post-traumatic stress disorder)   . Myocardial infarction [facility]) 2000's - ~ 04/2015    "I've had a total of 3" (11/18/2015)  . GERD (gastroesophageal reflux disease)   . Stroke [facility]) ~ 2006    denies residual on 11/18/2015  . Chronic lower back pain   . Anxiety   . ADD (attention deficit disorder)    Past Surgical History  Procedure Laterality Date  . Tonsillectomy    . Laparoscopic cholecystectomy    . Cardiac catheterization  "several"  . Coronary angioplasty  "several"  . Coronary angioplasty with stent placement  "several"    "total of 10 stents placed" (11/18/2015)  . Club foot release Left 1967/10/10  . Above knee leg amputation Left   . Orthopedic surgery  Feb 17, 1967-~ 2007     "total of 52 on my left leg; started w/club foot released"   Family History  Problem Relation Age of Onset  . Hypertension Father   . Lung cancer Father    Social History  Substance Use Topics  . Smoking status: Current Every Day Smoker -- 1.50 packs/day for 35 years    Types: Cigarettes  . Smokeless tobacco: Never Used  . Alcohol Use: No    Review of Systems  Constitutional: Negative for fever and chills.  HENT: Negative.   Eyes: Negative for visual disturbance.  Respiratory: Negative for cough and shortness of breath.   Cardiovascular: Positive for chest pain.  Gastrointestinal: Negative for nausea, vomiting, abdominal pain and diarrhea.  Genitourinary: Negative.   Musculoskeletal: Negative.   Skin: Negative.   Neurological: Negative.       Allergies  Adipex-p; Benadryl; Promethazine hcl; Augmentin; and Toradol  Home Medications   Prior to Admission medications   Medication Sig Start Date End Date Taking? Authorizing Provider  ALPRAZolam Duanne Moron) 1 MG tablet Take 1 mg by mouth 3 (three) times daily.   Yes Historical Provider, MD  clopidogrel (PLAVIX) 75 MG tablet Take 1 tablet (75 mg total) by mouth daily. 11/22/15  Yes Jessica U Vann, DO  nebivolol (BYSTOLIC) 10 MG tablet Take 1 tablet (10 mg total) by mouth daily. 11/22/15  Yes Geradine Girt, DO  oxyCODONE-acetaminophen (PERCOCET) 10-325 MG tablet Take 1 tablet  by mouth every 4 (four) hours as needed for pain.   Yes Historical Provider, MD  pantoprazole (PROTONIX) 40 MG tablet Take 1 tablet (40 mg total) by mouth daily. 11/22/15  Yes Geradine Girt, DO  ranolazine (RANEXA) 500 MG 12 hr tablet Take 1 tablet (500 mg total) by mouth daily. 11/22/15  Yes Geradine Girt, DO  rosuvastatin (CRESTOR) 5 MG tablet Take 1 tablet (5 mg total) by mouth daily at 6 PM. 11/22/15  Yes Jessica U Vann, DO   BP 129/80 mmHg  Pulse 69  Temp(Src) 98.3 F (36.8 C) (Oral)  Resp 20  Ht 6' (1.829 m)  Wt 73.3 kg  BMI 21.91 kg/m2  SpO2  98% Physical Exam  Constitutional: He is oriented to person, place, and time. He appears well-developed and well-nourished. No distress.  HENT:  Head: Normocephalic and atraumatic.  Mouth/Throat: No oropharyngeal exudate.  Eyes: Pupils are equal, round, and reactive to light. No scleral icterus.  Neck: Normal range of motion. Neck supple. No JVD present.  Cardiovascular: Normal rate, regular rhythm, normal heart sounds and intact distal pulses.  Exam reveals no gallop and no friction rub.   No murmur heard. Pulmonary/Chest: Effort normal. No respiratory distress. He has wheezes. He exhibits tenderness.  Scattered rhonchi and wheezing   Abdominal: Soft. He exhibits no distension. There is no tenderness. There is no rebound and no guarding.  Musculoskeletal: Normal range of motion. He exhibits no edema or tenderness.  Left leg amputation  Neurological: He is alert and oriented to person, place, and time. No cranial nerve deficit. He exhibits normal muscle tone. Coordination normal.  Skin: Skin is warm and dry. No rash noted. He is not diaphoretic. No erythema. No pallor.  Nursing note and vitals reviewed.   ED Course  Procedures (including critical care time) Labs Review Labs Reviewed  BASIC METABOLIC PANEL - Abnormal; Notable for the following:    Glucose, Bld 103 (*)    All other components within normal limits  CBC  I-STAT TROPOININ, ED  Randolm Idol, ED    Imaging Review Dg Chest 2 View  11/25/2015  CLINICAL DATA:  Intermittent left-sided chest pain for several days. Initial encounter. EXAM: CHEST  2 VIEW COMPARISON:  CT chest 11/22/2015.  PA and lateral chest 11/21/2015. FINDINGS: The lungs are emphysematous but clear. Heart size is normal. No pneumothorax or pleural effusion. Coronary artery stent is noted. IMPRESSION: Emphysema without acute disease. Electronically Signed   By: Inge Rise M.D.   On: 11/25/2015 14:31   I have personally reviewed and evaluated these  images and lab results as part of my medical decision-making.   EKG Interpretation   Date/Time:  Tuesday November 25 2015 14:04:31 EST Ventricular Rate:  85 PR Interval:  142 QRS Duration: 86 QT Interval:  364 QTC Calculation: 433 R Axis:   69 Text Interpretation:  Normal sinus rhythm Normal ECG since last tracing no  significant change Confirmed by BELFI  MD, MELANIE ([id number]) on 11/25/2015  8:45:48 PM      MDM   Final diagnoses:  Chest pain with moderate risk for cardiac etiology  Chest pain, unspecified chest pain type    Patient is a 50 year old male with a history of CAD and stroke who presents with chest pain as above. He has had multiple visits and admissions this month for similar chest pain. He came in today because the chest pain came back. He has a recent CTA which was negative with a recent perfusion  study that was unremarkable as well as multiple negative troponins. He states that it is unrelieved with nitroglycerin and aspirin that he took today. Consult to cardiology who recommended admission for serial enzymes and reassessment in the morning for further evaluation. Delta trop neg here and ekg unchanged. Pain ongoing despite treatment here. Patient will be admitted to medicine for further ACS r/o.     Heriberto Antigua, MD 11/26/15 [id number]  Malvin Johns, MD 11/26/15 [phone]

## 2015-11-25 NOTE — ED Notes (Signed)
Dr. Darrell Jewel at the bedside.

## 2015-11-25 NOTE — ED Notes (Signed)
Spoke to Dr. Lanetta Inch to request pain medication.

## 2015-11-25 NOTE — ED Notes (Signed)
Patient reports he already took 325mg  of aspirin today and 4 nitro tabs.

## 2015-11-25 NOTE — ED Notes (Signed)
hospitalist paged for pain med requested by patient.

## 2015-11-25 NOTE — H&P (Signed)
[facility] History and Physical  Aaron Dodson [id number] DOB: June 01, 1966 DOA: 11/25/2015  PCP: Pcp Not In System   Chief Complaint: Recurrent chest pain with history of CAD and prior MI  HPI: Aaron Dodson is a 50 y.o. gentleman with a history of CAD, multiple prior MIs, multiple prior stents, prior PE (no longer on anticoagulation), PTSD, chronic back pain, and anxiety who was just admitted to this hospital in the past week for evaluation of chest pain. He had a stress test on 1/11 that was negative for inducible ischemia (study was considered low risk).  He subsequently had a CTA of the chest that was negative for PE.  He is back in the ED tonight complaining of chest pressure across his entire anterior chest associated with a "suffocating feeling" and dyspnea on exertion.  It radiates to his back, and he says this is different from his chronic back pain.  No light-headedness, dizziness, or syncope.  No nausea, vomiting, or diaphoresis.  He took 4 SL NTG today without relief of his symptoms.  Pain is down to 4 out of 10 after receiving IV morphine in the ED.  Cardiology is aware of this patient.  Hospitalist asked to admit for observation.  He may need a cath this admission.  Of note, as I am concluding the interview, the patient tells me that he is going to jail later this month.  He admits to increased anxiety and acknowledges that this could be contributing to his symptoms.  He also admits to ongoing tobacco use and a fairly sedentary lifestyle recently.  He also says, "I will be honest.  I just want to have all based covered before I go to jail.  I don't want to be treated by 'state' doctors."  Last followed by [facility].  He reports a history of 10 total stents.  Records were requested during his last admission.  Review of Systems: 12 systems reviewed and negative except as stated in the HPI.  Past Medical History  Diagnosis Date  . Coronary artery disease   . Hypertension    . PE (pulmonary embolism) ~ 2007?; 09/2015  . Hyperlipidemia   . Anginal pain ([place])   . PTSD (post-traumatic stress disorder)   . Myocardial infarction [facility]) 2000's - ~ 04/2015    "I've had a total of 3" (11/18/2015)  . GERD (gastroesophageal reflux disease)   . Stroke [facility]) ~ 2006    denies residual on 11/18/2015  . Chronic lower back pain   . Anxiety   . ADD (attention deficit disorder)   He also reports a history of pericarditis.  Past Surgical History  Procedure Laterality Date  . Tonsillectomy    . Laparoscopic cholecystectomy    . Cardiac catheterization  "several"  . Coronary angioplasty  "several"  . Coronary angioplasty with stent placement  "several"    "total of 10 stents placed" (11/18/2015)  . Club foot release Left 1966-11-16  . Above knee leg amputation Left   . Orthopedic surgery  November 02, 1967-~ 2007    "total of 52 on my left leg; started w/club foot released"   Social History:  Social History   Social History Narrative  Active tobacco use No EtOH or illicit drug use He is married; he has five children.  Allergies  Allergen Reactions  . Adipex-P [Phentermine]     Aggressive behavior  . Benadryl [Diphenhydramine Hcl]     Aggressive behavior  . Promethazine Hcl     Unknown - given for  surgical procedure and advised to never take again  . Augmentin [Amoxicillin-Pot Clavulanate] Diarrhea and Nausea Only  . Toradol [Ketorolac Tromethamine] Hives    Redness    Family History  Problem Relation Age of Onset  . Hypertension Father   . Lung cancer Father   One brother also has HTN  Prior to Admission medications   Medication Sig Start Date End Date Taking? Authorizing Provider  ALPRAZolam Duanne Moron) 1 MG tablet Take 1 mg by mouth 3 (three) times daily.   Yes Historical Provider, MD  clopidogrel (PLAVIX) 75 MG tablet Take 1 tablet (75 mg total) by mouth daily. 11/22/15  Yes Jessica U Vann, DO  nebivolol (BYSTOLIC) 10 MG tablet Take 1 tablet (10 mg total) by mouth  daily. 11/22/15  Yes Geradine Girt, DO  oxyCODONE-acetaminophen (PERCOCET) 10-325 MG tablet Take 1 tablet by mouth every 4 (four) hours as needed for pain.   Yes Historical Provider, MD  pantoprazole (PROTONIX) 40 MG tablet Take 1 tablet (40 mg total) by mouth daily. 11/22/15  Yes Geradine Girt, DO  ranolazine (RANEXA) 500 MG 12 hr tablet Take 1 tablet (500 mg total) by mouth daily. 11/22/15  Yes Geradine Girt, DO  rosuvastatin (CRESTOR) 5 MG tablet Take 1 tablet (5 mg total) by mouth daily at 6 PM. 11/22/15  Yes Geradine Girt, DO   Physical Exam: Filed Vitals:   11/25/15 2245 11/25/15 2300 11/25/15 2315 11/25/15 2349  BP: 149/95 135/86 123/76 129/80  Pulse: 82 66 74 69  Temp:    98.3 F (36.8 C)  TempSrc:    Oral  Resp: 19 17 18 20   Height:    6' (1.829 m)  Weight:    73.3 kg (161 lb 9.6 oz)  SpO2: 98% 97% 98% 98%     General:  Awake and alert.  Oriented to person, place, time and situation.  NAD.  Eyes: PERRL bilaterally  ENT: Moist mucous membranes.   Neck: Supple.  No carotid bruit.   Cardiovascular: NR/RR.  No LE edema on right.  Prosthesis present on the left.  Respiratory: CTA bilaterally.  Abdomen: Soft/NT/ND.  Bowel sounds are present.  No guarding.  Skin: Warm and dry.  Musculoskeletal: Moves all four extremities spontaneously.  Psychiatric: Flat affect.  Neurologic: No focal deficits.  Labs on Admission:  Basic Metabolic Panel:  Recent Labs Lab 11/21/15 1854 11/25/15 1417  NA 142 141  K 3.7 3.8  CL 107 104  CO2 25 25  GLUCOSE 121* 103*  BUN 8 6  CREATININE 1.02 0.99  CALCIUM 10.0 9.7   CBC:  Recent Labs Lab 11/21/15 1854 11/25/15 1417  WBC 8.7 7.8  HGB 13.9 13.8  HCT 40.0 40.4  MCV 94.3 94.4  PLT 269 308   Cardiac Enzymes:  Recent Labs Lab 11/18/15 2355 11/19/15 0420 11/19/15 1041 11/22/15 1205  TROPONINI <0.03 <0.03 <0.03 0.06*    BNP (last 3 results)  Recent Labs  11/21/15 1854  BNP 40.4   Radiological Exams on  Admission: Dg Chest 2 View  11/25/2015  CLINICAL DATA:  Intermittent left-sided chest pain for several days. Initial encounter. EXAM: CHEST  2 VIEW COMPARISON:  CT chest 11/22/2015.  PA and lateral chest 11/21/2015. FINDINGS: The lungs are emphysematous but clear. Heart size is normal. No pneumothorax or pleural effusion. Coronary artery stent is noted. IMPRESSION: Emphysema without acute disease. Electronically Signed   By: Inge Rise M.D.   On: 11/25/2015 14:31    EKG: Independently reviewed.  NSR.  No acute ST segment changes.  Assessment/Plan Active Problems:   Chest pain   HLD (hyperlipidemia)   Essential hypertension   Tobacco abuse   CAD S/P - s/p RCA/ CFX   Anxiety state   1. Atypical chest pain with cardiac history and cardiac risk factors --Case discussed briefly with cardiology on-call, who was also contacted by the ED about this patient.  We will not anticoagulate at this time.  I will add aspirin and nitropaste to his admission orders.  PRN IV morphine.  Extra dose of Xanax now for anxiety.  Cycle troponin through the night.  NPO after midnight.  Will need to call cardiology in the AM (seen previously by Dr. Marlou Porch, then [place]) for consideration for LHC.  --Will also check echo; I do not see recent study in the computer.  2.  History of CAD, HTN --Continue other home medications including Plavix, Ranexa, statin therapy.  Code Status: FULL Disposition Plan: Low clinical index of suspicion for acute coronary syndrome at this point.  Patient can likely discharge to home tomorrow if LHC is unrevealing.  Time spent: 70 minutes  [employer] [facility]  11/25/2015, 11:53 PM

## 2015-11-25 NOTE — ED Notes (Signed)
Pt sts left sided CP x several days; pt hospitalized last week for same; pt sts pain is intermittent

## 2015-11-25 NOTE — ED Notes (Signed)
Spoke with Dr. Eulas Post, patient requests pain medication. MD acknowledges.

## 2015-11-26 ENCOUNTER — Inpatient Hospital Stay (HOSPITAL_COMMUNITY)
Admission: EM | Disposition: A | Discharge status: Home / Self Care | Payer: Medicare Other | Source: Home / Self Care | Attending: Family Medicine

## 2015-11-26 ENCOUNTER — Encounter (HOSPITAL_COMMUNITY): Payer: Self-pay

## 2015-11-26 ENCOUNTER — Observation Stay (HOSPITAL_BASED_OUTPATIENT_CLINIC_OR_DEPARTMENT_OTHER): Payer: Medicare Other

## 2015-11-26 DIAGNOSIS — R079 Chest pain, unspecified: Secondary | ICD-10-CM

## 2015-11-26 DIAGNOSIS — I200 Unstable angina: Secondary | ICD-10-CM

## 2015-11-26 DIAGNOSIS — I25110 Atherosclerotic heart disease of native coronary artery with unstable angina pectoris: Secondary | ICD-10-CM | POA: Insufficient documentation

## 2015-11-26 HISTORY — PX: CARDIAC CATHETERIZATION: SHX172

## 2015-11-26 LAB — CREATININE, SERUM
CREATININE: 0.99 mg/dL (ref 0.61–1.24)
GFR calc Af Amer: >60 mL/min (ref >60)
GFR calc non Af Amer: >60 mL/min (ref >60)

## 2015-11-26 LAB — APTT: aPTT: 36 seconds (ref 24–37)

## 2015-11-26 LAB — POCT ACTIVATED CLOTTING TIME: ACTIVATED CLOTTING TIME: 188 s

## 2015-11-26 LAB — CBC
HCT: 39.6 % (ref 39.0–52.0)
HEMOGLOBIN: 13.7 g/dL (ref 13.0–17.0)
MCH: 32.5 pg (ref 26.0–34.0)
MCHC: 34.6 g/dL (ref 30.0–36.0)
MCV: 94.1 fL (ref 78.0–100.0)
PLATELETS: 365 10*3/uL (ref 150–400)
RBC: 4.21 MIL/uL — ABNORMAL LOW (ref 4.22–5.81)
RDW: 14 % (ref 11.5–15.5)
WBC: 10.5 10*3/uL (ref 4.0–10.5)

## 2015-11-26 LAB — TROPONIN I: Troponin I: <0.03 ng/mL (ref <0.031)

## 2015-11-26 LAB — PROTIME-INR
INR: 1.02 (ref 0.00–1.49)
PROTHROMBIN TIME: 13.6 s (ref 11.6–15.2)

## 2015-11-26 SURGERY — LEFT HEART CATH AND CORONARY ANGIOGRAPHY

## 2015-11-26 MED ORDER — VERAPAMIL HCL 2.5 MG/ML IV SOLN
INTRAVENOUS | Status: DC | PRN
  Administered 2015-11-26: 16:00:00 via INTRA_ARTERIAL

## 2015-11-26 MED ORDER — ACETAMINOPHEN 325 MG PO TABS
650.0000 mg | ORAL_TABLET | ORAL | Status: DC | PRN
  Administered 2015-11-26: 650 mg via ORAL
  Filled 2015-11-26: qty 2

## 2015-11-26 MED ORDER — HEPARIN SODIUM (PORCINE) 1000 UNIT/ML IJ SOLN
INTRAMUSCULAR | Status: DC | PRN
  Administered 2015-11-26: 3500 [IU] via INTRAVENOUS

## 2015-11-26 MED ORDER — SODIUM CHLORIDE 0.9 % IV SOLN: 250.0000 mL | INTRAVENOUS | Status: DC | PRN

## 2015-11-26 MED ORDER — RANOLAZINE ER 500 MG PO TB12
500.0000 mg | ORAL_TABLET | Freq: Every day | ORAL | Status: DC
  Administered 2015-11-26: 500 mg via ORAL
  Filled 2015-11-26: qty 1

## 2015-11-26 MED ORDER — MIDAZOLAM HCL 2 MG/2ML IJ SOLN
INTRAMUSCULAR | Status: DC | PRN
Start: 2015-11-26 — End: 2015-11-26
  Administered 2015-11-26: 2 mg via INTRAVENOUS

## 2015-11-26 MED ORDER — IOHEXOL 350 MG/ML SOLN
INTRAVENOUS | Status: DC | PRN
  Administered 2015-11-26: 70 mL via INTRA_ARTERIAL

## 2015-11-26 MED ORDER — HEPARIN (PORCINE) IN NACL 2-0.9 UNIT/ML-% IJ SOLN
INTRAMUSCULAR | Status: AC
  Filled 2015-11-26: qty 1000

## 2015-11-26 MED ORDER — ONDANSETRON HCL 4 MG/2ML IJ SOLN: 4.0000 mg | Freq: Four times a day (QID) | INTRAMUSCULAR | Status: DC | PRN

## 2015-11-26 MED ORDER — VERAPAMIL HCL 2.5 MG/ML IV SOLN
INTRAVENOUS | Status: AC
  Filled 2015-11-26: qty 2

## 2015-11-26 MED ORDER — SODIUM CHLORIDE 0.9 % IV SOLN
INTRAVENOUS | Status: DC | PRN
  Administered 2015-11-26: 100 mL/h via INTRAVENOUS

## 2015-11-26 MED ORDER — FENTANYL CITRATE (PF) 100 MCG/2ML IJ SOLN
INTRAMUSCULAR | Status: DC | PRN
  Administered 2015-11-26: 50 ug via INTRAVENOUS

## 2015-11-26 MED ORDER — OXYCODONE HCL 5 MG PO TABS
10.0000 mg | ORAL_TABLET | ORAL | Status: DC
  Administered 2015-11-26: 10 mg via ORAL
  Filled 2015-11-26: qty 2

## 2015-11-26 MED ORDER — FENTANYL CITRATE (PF) 100 MCG/2ML IJ SOLN
INTRAMUSCULAR | Status: AC
  Filled 2015-11-26: qty 2

## 2015-11-26 MED ORDER — OXYCODONE HCL 5 MG PO TABS
5.0000 mg | ORAL_TABLET | ORAL | Status: DC | PRN
  Administered 2015-11-26 (×4): 5 mg via ORAL
  Filled 2015-11-26 (×5): qty 1

## 2015-11-26 MED ORDER — SODIUM CHLORIDE 0.9 % IJ SOLN: 3.0000 mL | INTRAMUSCULAR | Status: DC | PRN

## 2015-11-26 MED ORDER — PANTOPRAZOLE SODIUM 40 MG PO TBEC
40.0000 mg | DELAYED_RELEASE_TABLET | Freq: Every day | ORAL | Status: DC
  Administered 2015-11-26: 40 mg via ORAL
  Filled 2015-11-26: qty 1

## 2015-11-26 MED ORDER — SODIUM CHLORIDE 0.9 % IV SOLN: INTRAVENOUS | Status: AC

## 2015-11-26 MED ORDER — ALPRAZOLAM 0.5 MG PO TABS
1.0000 mg | ORAL_TABLET | Freq: Three times a day (TID) | ORAL | Status: DC
Start: 2015-11-26 — End: 2015-11-26
  Administered 2015-11-26 (×2): 1 mg via ORAL
  Filled 2015-11-26 (×2): qty 2

## 2015-11-26 MED ORDER — HEPARIN SODIUM (PORCINE) 1000 UNIT/ML IJ SOLN
INTRAMUSCULAR | Status: AC
  Filled 2015-11-26: qty 1

## 2015-11-26 MED ORDER — SODIUM CHLORIDE 0.9 % IJ SOLN: 3.0000 mL | Freq: Two times a day (BID) | INTRAMUSCULAR | Status: DC

## 2015-11-26 MED ORDER — OXYCODONE-ACETAMINOPHEN 10-325 MG PO TABS: 1.0000 | ORAL_TABLET | ORAL | Status: DC | PRN

## 2015-11-26 MED ORDER — NITROGLYCERIN 2 % TD OINT
1.0000 [in_us] | TOPICAL_OINTMENT | Freq: Four times a day (QID) | TRANSDERMAL | Status: DC
  Administered 2015-11-26 (×3): 1 [in_us] via TOPICAL
  Filled 2015-11-26: qty 30

## 2015-11-26 MED ORDER — ENOXAPARIN SODIUM 40 MG/0.4ML ~~LOC~~ SOLN
40.0000 mg | Freq: Every day | SUBCUTANEOUS | Status: DC
  Filled 2015-11-26: qty 0.4

## 2015-11-26 MED ORDER — SODIUM CHLORIDE 0.9 % WEIGHT BASED INFUSION: 3.0000 mL/kg/h | INTRAVENOUS | Status: DC

## 2015-11-26 MED ORDER — MIDAZOLAM HCL 2 MG/2ML IJ SOLN
INTRAMUSCULAR | Status: AC
  Filled 2015-11-26: qty 2

## 2015-11-26 MED ORDER — ROSUVASTATIN CALCIUM 5 MG PO TABS
5.0000 mg | ORAL_TABLET | Freq: Every day | ORAL | Status: DC
  Filled 2015-11-26: qty 1

## 2015-11-26 MED ORDER — NITROGLYCERIN 0.4 MG SL SUBL: 0.4000 mg | SUBLINGUAL_TABLET | SUBLINGUAL | Status: DC | PRN

## 2015-11-26 MED ORDER — CLOPIDOGREL BISULFATE 75 MG PO TABS
75.0000 mg | ORAL_TABLET | Freq: Every day | ORAL | Status: DC
  Administered 2015-11-26: 75 mg via ORAL
  Filled 2015-11-26: qty 1

## 2015-11-26 MED ORDER — OXYCODONE-ACETAMINOPHEN 5-325 MG PO TABS
1.0000 | ORAL_TABLET | ORAL | Status: DC | PRN
  Administered 2015-11-26 (×4): 1 via ORAL
  Filled 2015-11-26 (×5): qty 1

## 2015-11-26 MED ORDER — LIDOCAINE HCL (PF) 1 % IJ SOLN
INTRAMUSCULAR | Status: AC
  Filled 2015-11-26: qty 30

## 2015-11-26 MED ORDER — NICOTINE 21 MG/24HR TD PT24
21.0000 mg | MEDICATED_PATCH | Freq: Every day | TRANSDERMAL | Status: DC
  Administered 2015-11-26: 21 mg via TRANSDERMAL
  Filled 2015-11-26: qty 1

## 2015-11-26 MED ORDER — NEBIVOLOL HCL 5 MG PO TABS
10.0000 mg | ORAL_TABLET | Freq: Every day | ORAL | Status: DC
  Administered 2015-11-26: 10 mg via ORAL
  Filled 2015-11-26: qty 2

## 2015-11-26 MED ORDER — SODIUM CHLORIDE 0.9 % WEIGHT BASED INFUSION: 1.0000 mL/kg/h | INTRAVENOUS | Status: DC

## 2015-11-26 SURGICAL SUPPLY — 13 items
CATH INFINITI 5 FR JL3.5 (CATHETERS) ×3 IMPLANT
CATH INFINITI 5FR ANG PIGTAIL (CATHETERS) ×3 IMPLANT
CATH INFINITI JR4 5F (CATHETERS) ×3 IMPLANT
DEVICE RAD COMP TR BAND LRG (VASCULAR PRODUCTS) ×3 IMPLANT
GLIDESHEATH SLEND SS 6F .021 (SHEATH) ×3 IMPLANT
KIT HEART LEFT (KITS) ×3 IMPLANT
PACK CARDIAC CATHETERIZATION (CUSTOM PROCEDURE TRAY) ×3 IMPLANT
SHEATH PINNACLE 4F 10CM (SHEATH) ×3 IMPLANT
SYR MEDRAD MARK V 150ML (SYRINGE) ×3 IMPLANT
TRANSDUCER W/STOPCOCK (MISCELLANEOUS) ×3 IMPLANT
TUBING CIL FLEX 10 FLL-RA (TUBING) ×3 IMPLANT
WIRE HI TORQ VERSACORE-J 145CM (WIRE) ×3 IMPLANT
WIRE SAFE-T 1.5MM-J .035X260CM (WIRE) ×3 IMPLANT

## 2015-11-26 NOTE — Progress Notes (Signed)
Consent signed for cardiac cath. Continues to request 12 hour Rx to manage pain.

## 2015-11-26 NOTE — Progress Notes (Signed)
Patient admitted to room from ED. Patent given pain medication in Ed and also upon admission to the floor. Oriented to room, call light within reach.

## 2015-11-26 NOTE — Progress Notes (Signed)
Patient requested tech call up from ECHO to instruct "his nurse to print his papers, he plans to d/c once he gets back to the floor. Patient is on the schedule for 2:30 PM Cardiac Cath today. Paged Dr. Verlon Au - Verlon Au stated he may leave if he declines Cath, he is competent; will not be able to order Oxymorphone without previous records. Patient has not returned to the floor.

## 2015-11-26 NOTE — H&P (View-Only) (Signed)
CARDIOLOGY CONSULT NOTE  Patient ID: Aaron Dodson MRN: [id number] DOB/AGE: 1966-06-05 50 y.o.  Admit date: 11/25/2015 Primary Physician Pcp Not In System Primary Cardiologist Dr. Marlou Porch Chief Complaint  Chest pain  HPI:  The patient reports a long history of CAD.  However, we don't have these records.  He was treated elsewhere in [place] before moving here.  He reports multiple stents.  He reports his last cath was at [facility] in 2015 and he received 2 stents.  He reports 10 overall.  This is is second visit in a week for evaluation of chest pain.  He was discharged on 1/13 after ruling out for MI.  Stress perfusion study demonstrated and EF of 57% with large size, moderate severity defect thought to be diaphragmatic.  However, previous scar could not be excluded.    He reports that the pain comes and goes.  It is similar to his previous angina.  It is sharp and dull and radiates straight through to his left shoulder.  It becomes severe and he will get nausea.  He took 4 NTG yesterday and tried to be seen at [facility].  However, he was referred to the ED.  Since admission his enzymes have been negative.  EKG demonstrates no acute changes.  Of note, complicating his situation, he is due to be incarcerated later this month.     Past Medical History  Diagnosis Date  . Coronary artery disease   . Hypertension   . Hyperlipidemia   . PTSD (post-traumatic stress disorder)   . Myocardial infarction [facility]) 2000's - ~ 04/2015    "I've had a total of 3" (11/18/2015)  . GERD (gastroesophageal reflux disease)   . Stroke [facility]) ~ 2006    denies residual on 11/18/2015  . Chronic lower back pain   . Anxiety   . ADD (attention deficit disorder)     Past Surgical History  Procedure Laterality Date  . Tonsillectomy    . Laparoscopic cholecystectomy    . Cardiac catheterization  "several"  . Coronary angioplasty  "several"  . Coronary angioplasty with stent placement  "several"    "total of 10 stents  placed" (11/18/2015)  . 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 Above knee leg amputation Left   . Orthopedic surgery  23-Feb-1967-~ 2007    "total of 52 on my left leg; started w/club foot released"    Allergies  Allergen Reactions  . Adipex-P [Phentermine]     Aggressive behavior  . Benadryl [Diphenhydramine Hcl]     Aggressive behavior  . Promethazine Hcl     Unknown - given for surgical procedure and advised to never take again  . Augmentin [Amoxicillin-Pot Clavulanate] Diarrhea and Nausea Only  . Toradol [Ketorolac Tromethamine] Hives    Redness   Prescriptions prior to admission  Medication Sig Dispense Refill Last Dose  . ALPRAZolam (XANAX) 1 MG tablet Take 1 mg by mouth 3 (three) times daily.   11/25/2015 at Unknown time  . clopidogrel (PLAVIX) 75 MG tablet Take 1 tablet (75 mg total) by mouth daily. 30 tablet 0 11/25/2015 at 800  . nebivolol (BYSTOLIC) 10 MG tablet Take 1 tablet (10 mg total) by mouth daily. 30 tablet 0 11/25/2015 at 800  . oxyCODONE-acetaminophen (PERCOCET) 10-325 MG tablet Take 1 tablet by mouth every 4 (four) hours as needed for pain.   11/25/2015 at Unknown time  . pantoprazole (PROTONIX) 40 MG tablet Take 1 tablet (40 mg total) by mouth daily. 30 tablet 0  11/25/2015 at Unknown time  . ranolazine (RANEXA) 500 MG 12 hr tablet Take 1 tablet (500 mg total) by mouth daily. 30 each 0 11/25/2015 at Unknown time  . rosuvastatin (CRESTOR) 5 MG tablet Take 1 tablet (5 mg total) by mouth daily at 6 PM. 30 tablet 0 11/24/2015 at Unknown time   Family History  Problem Relation Age of Onset  . Hypertension Father   . Lung cancer Father     Social History   Social History  . Marital Status: Divorced    Spouse Name: N/A  . Number of Children: N/A  . Years of Education: N/A   Occupational History  . Not on file.   Social History Main Topics  . Smoking status: Current Every Day Smoker -- 1.50 packs/day for 35 years    Types: Cigarettes  . Smokeless tobacco: Never Used  .  Alcohol Use: No  . 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 Sexual Activity: Not Currently   Other Topics Concern  . Not on file   Social History Narrative   Lives with ex wife by his report.  On disability.       ROS:    As stated in the HPI and negative for all other systems.  Physical Exam: Blood pressure 122/78, pulse 71, temperature 97.9 F (36.6 C), temperature source Oral, resp. rate 18, height 6' (1.829 m), weight 161 lb 9.6 oz (73.3 kg), SpO2 99 %.  GENERAL:  Well appearing HEENT:  Pupils equal round and reactive, fundi not visualized, oral mucosa unremarkable, dentures NECK:  No jugular venous distention, waveform within normal limits, carotid upstroke brisk and symmetric, no bruits, no thyromegaly LYMPHATICS:  No cervical, inguinal adenopathy LUNGS:  Clear to auscultation bilaterally BACK:  No CVA tenderness CHEST:  Unremarkable HEART:  PMI not displaced or sustained,S1 and S2 within normal limits, no S3, no S4, no clicks, no rubs, no murmurs ABD:  Flat, positive bowel sounds normal in frequency in pitch, no bruits, no rebound, no guarding, no midline pulsatile mass, no hepatomegaly, no splenomegaly EXT:  2 plus pulses throughout, no edema, no cyanosis no clubbing, status post left AKA SKIN:  No rashes no nodules NEURO:  Cranial nerves II through XII grossly intact, motor grossly intact throughout PSYCH:  Cognitively intact, oriented to person place and time   Labs: Lab Results  Component Value Date   BUN 6 11/25/2015   Lab Results  Component Value Date   CREATININE 0.99 11/26/2015   Lab Results  Component Value Date   NA 141 11/25/2015   K 3.8 11/25/2015   CL 104 11/25/2015   CO2 25 11/25/2015   Lab Results  Component Value Date   TROPONINI <0.03 11/26/2015   Lab Results  Component Value Date   WBC 10.5 11/26/2015   HGB 13.7 11/26/2015   HCT 39.6 11/26/2015   MCV 94.1 11/26/2015   PLT 365 11/26/2015    No results found for: CHOL, HDL, LDLCALC, LDLDIRECT, TRIG, CHOLHDL No results found for: ALT, AST, GGT, ALKPHOS, BILITOT    Radiology:   CXR: FINDINGS: The lungs are emphysematous but clear. Heart size is normal. No pneumothorax or pleural effusion. Coronary artery stent is noted.  EKG: Sinus rhythm, rate 83, axis within normal limits, intervals within normal limits, no acute ST-T wave changes.  ASSESSMENT AND PLAN:   CHEST PAIN:  Consistent with unstable angina.  Needs cardiac cath.  The patient understands that risks included but are not limited to stroke (1 in 1000), death (1 in 51), kidney failure [usually temporary] (1 in 500), bleeding (1 in 200), allergic reaction [possibly serious] (1 in 200).  The patient understands and agrees to proceed.   We are trying to get records from [place].    ELEVATED D DIMER:  He initially talked about having two PEs.   However on further investigation he has elevated D dimers.  "They have never been able to find the PEs."  I removed PE from the past history  Signed: Minus Breeding 11/26/2015, 11:32 AM

## 2015-11-26 NOTE — Progress Notes (Signed)
  Echocardiogram 2D Echocardiogram has been performed.  Aaron Dodson 11/26/2015, 1:38 PM

## 2015-11-26 NOTE — Progress Notes (Signed)
Site area: right groin a 4 french sheath was removed  Site Prior to Removal:  Level 0  Pressure Applied For 15 MINUTES    Minutes Beginning at 1645p  Manual:   Yes.    Patient Status During Pull:  stable  Post Pull Groin Site:  Level 0  Post Pull Instructions Given:  Yes.    Post Pull Pulses Present:  Yes.    Dressing Applied:  Yes.    Comments:  VS remain stable during sheath pull.

## 2015-11-26 NOTE — Progress Notes (Signed)
IV access Rt hand flushed - saline lock.

## 2015-11-26 NOTE — Interval H&P Note (Signed)
History and Physical Interval Note:  11/26/2015 3:18 PM  [name]  has presented today for cardiac cath with the diagnosis of unstable angina.  The various methods of treatment have been discussed with the patient and family. After consideration of risks, benefits and other options for treatment, the patient has consented to  Procedure(s): Left Heart Cath and Coronary Angiography (N/A) as a surgical intervention .  The patient's history has been reviewed, patient examined, no change in status, stable for surgery.  I have reviewed the patient's chart and labs.  Questions were answered to the patient's satisfaction.    Cath Lab Visit (complete for each Cath Lab visit)  Clinical Evaluation Leading to the Procedure:   ACS: No.  Non-ACS:    Anginal Classification: CCS III  Anti-ischemic medical therapy: Maximal Therapy (2 or more classes of medications)  Non-Invasive Test Results: Low-risk stress test findings: cardiac mortality <1%/year  Prior CABG: No previous CABG         Keron Neenan

## 2015-11-26 NOTE — Discharge Summary (Signed)
Physician Discharge Summary  Aaron Dodson [id number] DOB: [birth year] DOA: 11/25/2015  PCP: Pcp Not In System  Admit date: 11/25/2015 Discharge date: 11/26/2015  Time spent: 35 minutes  Recommendations for Outpatient Follow-up:  1. Needs OP follow up with his regular Cardiologist  Discharge Diagnoses:  Active Problems:   Chest pain   HLD (hyperlipidemia)   Essential hypertension   Tobacco abuse   CAD S/P - s/p RCA/ CFX   Anxiety state   Unstable angina (HCC)   Coronary artery disease involving native coronary artery of native heart with unstable angina pectoris [facility])   Discharge Condition: fair  Diet recommendation: hh low salt  Filed Weights   11/25/15 2349  Weight: 73.3 kg (161 lb 9.6 oz)    History of present illness:  50 y/o ? Smoker Signif CAd with no current records available LAst cath 2015 [facility]-2 stents Recently ruled out for MI 11/21/15 Stress showed EF 57&-scar couldn't be excluded  He re-presented to ED 1/17 with CP Cardiology consulted-Cath perfroemd revealed low-risk findings as below He will be d/c home after bedrest to follow with his usual MD's   Procedures: Conclusion    1. Triple vessel CAD 2. The LAD has mild plaque in the proximal segment and a patent stent in the distal/apical segment without restenosis.  3. The Circumflex has an ostial stent that extends into OM1. There is 40% ostial narrowing within the stent. The continuation of the AV groove Circumflex beyond OM1 is jailed by the stent with 50-70% stenosis. This continuation branch of the AV groove Circumflex is small in caliber.  4. The RCA has patent stents extending from the ostium of the vessel down into the PDA. There is diffuse mild stent restenosis.  5. The left main has a distal 30% stenosis.  6. Normal LV systolic function   Discharge Exam: Filed Vitals:   11/26/15 1640 11/26/15 1645  BP: 113/70 101/74  Pulse: 73 68  Temp:    Resp: 15 13    General: eomi  ncat Cardiovascular: s1 s2 no m/r/g Respiratory: clear no added sound  Discharge Instructions   Discharge Instructions    Diet - low sodium heart healthy    Complete by:  As directed      Discharge instructions    Complete by:  As directed   Follow with your regular MD as an OP Your Chest pain was not cardiac and you should follow with your regular Md as an outpatient You will not be prescribed any pain meds on d/c home     Increase activity slowly    Complete by:  As directed           Current Discharge Medication List    CONTINUE these medications which have NOT CHANGED   Details  ALPRAZolam (XANAX) 1 MG tablet Take 1 mg by mouth 3 (three) times daily.    clopidogrel (PLAVIX) 75 MG tablet Take 1 tablet (75 mg total) by mouth daily. Qty: 30 tablet, Refills: 0    nebivolol (BYSTOLIC) 10 MG tablet Take 1 tablet (10 mg total) by mouth daily. Qty: 30 tablet, Refills: 0    oxyCODONE-acetaminophen (PERCOCET) 10-325 MG tablet Take 1 tablet by mouth every 4 (four) hours as needed for pain.    pantoprazole (PROTONIX) 40 MG tablet Take 1 tablet (40 mg total) by mouth daily. Qty: 30 tablet, Refills: 0    ranolazine (RANEXA) 500 MG 12 hr tablet Take 1 tablet (500 mg total) by mouth daily. Qty: 30  each, Refills: 0    rosuvastatin (CRESTOR) 5 MG tablet Take 1 tablet (5 mg total) by mouth daily at 6 PM. Qty: 30 tablet, Refills: 0       Allergies  Allergen Reactions  . Adipex-P [Phentermine]     Aggressive behavior  . Benadryl [Diphenhydramine Hcl]     Aggressive behavior  . Promethazine Hcl     Unknown - given for surgical procedure and advised to never take again  . Augmentin [Amoxicillin-Pot Clavulanate] Diarrhea and Nausea Only  . Toradol [Ketorolac Tromethamine] Hives    Redness      The results of significant diagnostics from this hospitalization (including imaging, microbiology, ancillary and laboratory) are listed below for reference.    Significant Diagnostic  Studies: Dg Chest 2 View  11/25/2015  CLINICAL DATA:  Intermittent left-sided chest pain for several days. Initial encounter. EXAM: CHEST  2 VIEW COMPARISON:  CT chest 11/22/2015.  PA and lateral chest 11/21/2015. FINDINGS: The lungs are emphysematous but clear. Heart size is normal. No pneumothorax or pleural effusion. Coronary artery stent is noted. IMPRESSION: Emphysema without acute disease. Electronically Signed   By: Inge Rise M.D.   On: 11/25/2015 14:31   Dg Chest 2 View  11/21/2015  CLINICAL DATA:  Acute chest pain and shortness of breath. EXAM: CHEST  2 VIEW COMPARISON:  November 18, 2015. FINDINGS: The heart size and mediastinal contours are within normal limits. Both lungs are clear. No pneumothorax or pleural effusion is noted. The visualized skeletal structures are unremarkable. IMPRESSION: No active cardiopulmonary disease. Electronically Signed   By: Marijo Conception, M.D.   On: 11/21/2015 19:50   Dg Chest 2 View  11/18/2015  CLINICAL DATA:  Chest pain, shortness of breath since noon today. EXAM: CHEST  2 VIEW COMPARISON:  None. FINDINGS: Heart is normal size. Mediastinal contours are within limits. Coronary artery stents noted. Lungs are clear. No effusions. No acute bony abnormality. IMPRESSION: No active cardiopulmonary disease. Electronically Signed   By: Rolm Baptise M.D.   On: 11/18/2015 16:01   Ct Angio Chest Pe W/cm &/or Wo Cm  11/22/2015  CLINICAL DATA:  Acute onset of generalized chest pain and cough. Pressure radiates to the neck. Initial encounter. EXAM: CT ANGIOGRAPHY CHEST WITH CONTRAST TECHNIQUE: Multidetector CT imaging of the chest was performed using the standard protocol during bolus administration of intravenous contrast. Multiplanar CT image reconstructions and MIPs were obtained to evaluate the vascular anatomy. CONTRAST:  116mL OMNIPAQUE IOHEXOL 350 MG/ML SOLN COMPARISON:  Chest radiograph performed 11/21/2015 FINDINGS: There is no evidence of pulmonary embolus.  Minimal right basilar atelectasis is noted. Bilateral emphysematous change is noted, most prominent at the upper lung lobes. The lungs are otherwise grossly clear. There is no evidence of significant focal consolidation, pleural effusion or pneumothorax. No masses are identified; no abnormal focal contrast enhancement is seen. Diffuse coronary artery calcifications are seen. The mediastinum is otherwise unremarkable in appearance. No pericardial effusion is identified. The great vessels are grossly unremarkable. No axillary lymphadenopathy is seen. The visualized portions of the thyroid gland are unremarkable in appearance. The visualized portions of the liver and spleen are unremarkable. No acute osseous abnormalities are seen. Review of the MIP images confirms the above findings. IMPRESSION: 1. No evidence of pulmonary embolus. 2. Minimal right basilar atelectasis noted. Bilateral emphysematous change, most prominent at the upper lung lobes. 3. Diffuse coronary artery calcifications seen. Electronically Signed   By: Garald Balding M.D.   On: 11/22/2015 03:06  Nm Myocar Multi W/spect W/wall Motion / Ef  11/19/2015  CLINICAL DATA:  Chest pain. Prior angioplasty and stenting. Hypertension. Coronary artery disease. Gastroesophageal reflux disease. EXAM: MYOCARDIAL IMAGING WITH SPECT (REST AND PHARMACOLOGIC-STRESS) GATED LEFT VENTRICULAR WALL MOTION STUDY LEFT VENTRICULAR EJECTION FRACTION TECHNIQUE: Standard myocardial SPECT imaging was performed after resting intravenous injection of 10 mCi Tc-3m sestamibi. Subsequently, intravenous infusion of Lexiscan was performed under the supervision of the Cardiology staff. At peak effect of the drug, 30 mCi Tc-29m sestamibi was injected intravenously and standard myocardial SPECT imaging was performed. Quantitative gated imaging was also performed to evaluate left ventricular wall motion, and estimate left ventricular ejection fraction. COMPARISON:  11/18/2015 FINDINGS:  Perfusion: Reduced activity in the inferior wall on stress and rest imaging likely due to diaphragmatic attenuation,, less likely from scarring in the inferior wall. No inducible ischemia. Wall Motion: Normal left ventricular wall motion. No left ventricular dilation. Left Ventricular Ejection Fraction: 56 % End diastolic volume 0000000 ml End systolic volume 57 ml IMPRESSION: 1. Reduced inferior wall measured perfusion on stress and rest images, large size moderate severity, probably from diaphragmatic attenuation, less likely due to scar given the relatively normal wall motion and wall thickening in this vicinity. 2. Normal left ventricular wall motion. 3. Left ventricular ejection fraction 57% 4. Low-risk stress test findings*. *2012 Appropriate Use Criteria for Coronary Revascularization Focused Update: J Am Coll Cardiol. [id number]. http://content.airportbarriers.com.aspx?articleid=[email or web address] Electronically Signed   By: Van Clines M.D.   On: 11/19/2015 15:07    Microbiology: No results found for this or any previous visit (from the past 240 hour(s)).   Labs: Basic Metabolic Panel:  Recent Labs Lab 11/21/15 1854 11/25/15 1417 11/26/15 0156  NA 142 141  --   K 3.7 3.8  --   CL 107 104  --   CO2 25 25  --   GLUCOSE 121* 103*  --   BUN 8 6  --   CREATININE 1.02 0.99 0.99  CALCIUM 10.0 9.7  --    Liver Function Tests: No results for input(s): AST, ALT, ALKPHOS, BILITOT, PROT, ALBUMIN in the last 168 hours. No results for input(s): LIPASE, AMYLASE in the last 168 hours. No results for input(s): AMMONIA in the last 168 hours. CBC:  Recent Labs Lab 11/21/15 1854 11/25/15 1417 11/26/15 0156  WBC 8.7 7.8 10.5  HGB 13.9 13.8 13.7  HCT 40.0 40.4 39.6  MCV 94.3 94.4 94.1  PLT 269 308 365   Cardiac Enzymes:  Recent Labs Lab 11/22/15 1205 11/26/15 0156 11/26/15 0422 11/26/15 0730  TROPONINI 0.06* <0.03 <0.03 <0.03   BNP: BNP (last 3 results)  Recent Labs   11/21/15 1854  BNP 40.4    ProBNP (last 3 results) No results for input(s): PROBNP in the last 8760 hours.  CBG: No results for input(s): GLUCAP in the last 168 hours.     SignedNita Sells MD   [facility] 11/26/2015, 5:01 PM

## 2015-11-26 NOTE — Consult Note (Signed)
CARDIOLOGY CONSULT NOTE  Patient ID: Aaron Dodson MRN: [id number] DOB/AGE: [birth year] 50 y.o.  Admit date: 11/25/2015 Primary Physician Pcp Not In System Primary Cardiologist Dr. Marlou Porch Chief Complaint  Chest pain  HPI:  The patient reports a long history of CAD.  However, we don't have these records.  He was treated elsewhere in [place] before moving here.  He reports multiple stents.  He reports his last cath was at [facility] in 2015 and he received 2 stents.  He reports 10 overall.  This is is second visit in a week for evaluation of chest pain.  He was discharged on 1/13 after ruling out for MI.  Stress perfusion study demonstrated and EF of 57% with large size, moderate severity defect thought to be diaphragmatic.  However, previous scar could not be excluded.    He reports that the pain comes and goes.  It is similar to his previous angina.  It is sharp and dull and radiates straight through to his left shoulder.  It becomes severe and he will get nausea.  He took 4 NTG yesterday and tried to be seen at [facility].  However, he was referred to the ED.  Since admission his enzymes have been negative.  EKG demonstrates no acute changes.  Of note, complicating his situation, he is due to be incarcerated later this month.     Past Medical History  Diagnosis Date  . Coronary artery disease   . Hypertension   . Hyperlipidemia   . PTSD (post-traumatic stress disorder)   . Myocardial infarction [facility]) 2000's - ~ 04/2015    "I've had a total of 3" (11/18/2015)  . GERD (gastroesophageal reflux disease)   . Stroke [facility]) ~ 2006    denies residual on 11/18/2015  . Chronic lower back pain   . Anxiety   . ADD (attention deficit disorder)     Past Surgical History  Procedure Laterality Date  . Tonsillectomy    . Laparoscopic cholecystectomy    . Cardiac catheterization  "several"  . Coronary angioplasty  "several"  . Coronary angioplasty with stent placement  "several"    "total of 10 stents  placed" (11/18/2015)  . 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 Above knee leg amputation Left   . Orthopedic surgery  1967/05/11-~ 2007    "total of 52 on my left leg; started w/club foot released"    Allergies  Allergen Reactions  . Adipex-P [Phentermine]     Aggressive behavior  . Benadryl [Diphenhydramine Hcl]     Aggressive behavior  . Promethazine Hcl     Unknown - given for surgical procedure and advised to never take again  . Augmentin [Amoxicillin-Pot Clavulanate] Diarrhea and Nausea Only  . Toradol [Ketorolac Tromethamine] Hives    Redness   Prescriptions prior to admission  Medication Sig Dispense Refill Last Dose  . ALPRAZolam (XANAX) 1 MG tablet Take 1 mg by mouth 3 (three) times daily.   11/25/2015 at Unknown time  . clopidogrel (PLAVIX) 75 MG tablet Take 1 tablet (75 mg total) by mouth daily. 30 tablet 0 11/25/2015 at 800  . nebivolol (BYSTOLIC) 10 MG tablet Take 1 tablet (10 mg total) by mouth daily. 30 tablet 0 11/25/2015 at 800  . oxyCODONE-acetaminophen (PERCOCET) 10-325 MG tablet Take 1 tablet by mouth every 4 (four) hours as needed for pain.   11/25/2015 at Unknown time  . pantoprazole (PROTONIX) 40 MG tablet Take 1 tablet (40 mg total) by mouth daily. 30 tablet 0  11/25/2015 at Unknown time  . ranolazine (RANEXA) 500 MG 12 hr tablet Take 1 tablet (500 mg total) by mouth daily. 30 each 0 11/25/2015 at Unknown time  . rosuvastatin (CRESTOR) 5 MG tablet Take 1 tablet (5 mg total) by mouth daily at 6 PM. 30 tablet 0 11/24/2015 at Unknown time   Family History  Problem Relation Age of Onset  . Hypertension Father   . Lung cancer Father     Social History   Social History  . Marital Status: Divorced    Spouse Name: N/A  . Number of Children: N/A  . Years of Education: N/A   Occupational History  . Not on file.   Social History Main Topics  . Smoking status: Current Every Day Smoker -- 1.50 packs/day for 35 years    Types: Cigarettes  . Smokeless tobacco: Never Used  .  Alcohol Use: No  . 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 Sexual Activity: Not Currently   Other Topics Concern  . Not on file   Social History Narrative   Lives with ex wife by his report.  On disability.       ROS:    As stated in the HPI and negative for all other systems.  Physical Exam: Blood pressure 122/78, pulse 71, temperature 97.9 F (36.6 C), temperature source Oral, resp. rate 18, height 6' (1.829 m), weight 161 lb 9.6 oz (73.3 kg), SpO2 99 %.  GENERAL:  Well appearing HEENT:  Pupils equal round and reactive, fundi not visualized, oral mucosa unremarkable, dentures NECK:  No jugular venous distention, waveform within normal limits, carotid upstroke brisk and symmetric, no bruits, no thyromegaly LYMPHATICS:  No cervical, inguinal adenopathy LUNGS:  Clear to auscultation bilaterally BACK:  No CVA tenderness CHEST:  Unremarkable HEART:  PMI not displaced or sustained,S1 and S2 within normal limits, no S3, no S4, no clicks, no rubs, no murmurs ABD:  Flat, positive bowel sounds normal in frequency in pitch, no bruits, no rebound, no guarding, no midline pulsatile mass, no hepatomegaly, no splenomegaly EXT:  2 plus pulses throughout, no edema, no cyanosis no clubbing, status post left AKA SKIN:  No rashes no nodules NEURO:  Cranial nerves II through XII grossly intact, motor grossly intact throughout PSYCH:  Cognitively intact, oriented to person place and time   Labs: Lab Results  Component Value Date   BUN 6 11/25/2015   Lab Results  Component Value Date   CREATININE 0.99 11/26/2015   Lab Results  Component Value Date   NA 141 11/25/2015   K 3.8 11/25/2015   CL 104 11/25/2015   CO2 25 11/25/2015   Lab Results  Component Value Date   TROPONINI <0.03 11/26/2015   Lab Results  Component Value Date   WBC 10.5 11/26/2015   HGB 13.7 11/26/2015   HCT 39.6 11/26/2015   MCV 94.1 11/26/2015   PLT 365 11/26/2015    No results found for: CHOL, HDL, LDLCALC, LDLDIRECT, TRIG, CHOLHDL No results found for: ALT, AST, GGT, ALKPHOS, BILITOT    Radiology:   CXR: FINDINGS: The lungs are emphysematous but clear. Heart size is normal. No pneumothorax or pleural effusion. Coronary artery stent is noted.  EKG: Sinus rhythm, rate 83, axis within normal limits, intervals within normal limits, no acute ST-T wave changes.  ASSESSMENT AND PLAN:   CHEST PAIN:  Consistent with unstable angina.  Needs cardiac cath.  The patient understands that risks included but are not limited to stroke (1 in 1000), death (1 in 56), kidney failure [usually temporary] (1 in 500), bleeding (1 in 200), allergic reaction [possibly serious] (1 in 200).  The patient understands and agrees to proceed.   We are trying to get records from [place].    ELEVATED D DIMER:  He initially talked about having two PEs.   However on further investigation he has elevated D dimers.  "They have never been able to find the PEs."  I removed PE from the past history  Signed: Minus Breeding 11/26/2015, 11:32 AM

## 2015-11-27 ENCOUNTER — Encounter (HOSPITAL_COMMUNITY): Payer: Self-pay | Admitting: Cardiovascular Disease

## 2015-11-27 MED FILL — Lidocaine HCl Local Preservative Free (PF) Inj 1%: INTRAMUSCULAR | Qty: 30 | Status: AC

## 2015-11-27 MED FILL — Heparin Sodium (Porcine) 2 Unit/ML in Sodium Chloride 0.9%: INTRAMUSCULAR | Qty: 500 | Status: AC

## 2016-06-13 ENCOUNTER — Encounter (HOSPITAL_COMMUNITY): Payer: Self-pay | Admitting: *Deleted

## 2016-06-13 ENCOUNTER — Emergency Department (HOSPITAL_COMMUNITY): Payer: Medicare Other

## 2016-06-13 ENCOUNTER — Observation Stay (HOSPITAL_COMMUNITY)
Admission: EM | Admit: 2016-06-13 | Discharge: 2016-06-14 | Disposition: A | Discharge status: Home / Self Care | Payer: Medicare Other | Attending: Internal Medicine | Admitting: Internal Medicine

## 2016-06-13 DIAGNOSIS — I10 Essential (primary) hypertension: Secondary | ICD-10-CM | POA: Insufficient documentation

## 2016-06-13 DIAGNOSIS — Z880 Allergy status to penicillin: Secondary | ICD-10-CM | POA: Insufficient documentation

## 2016-06-13 DIAGNOSIS — R05 Cough: Secondary | ICD-10-CM | POA: Diagnosis present

## 2016-06-13 DIAGNOSIS — K219 Gastro-esophageal reflux disease without esophagitis: Secondary | ICD-10-CM | POA: Diagnosis not present

## 2016-06-13 DIAGNOSIS — Z955 Presence of coronary angioplasty implant and graft: Secondary | ICD-10-CM | POA: Diagnosis not present

## 2016-06-13 DIAGNOSIS — F17200 Nicotine dependence, unspecified, uncomplicated: Secondary | ICD-10-CM | POA: Diagnosis present

## 2016-06-13 DIAGNOSIS — Z79899 Other long term (current) drug therapy: Secondary | ICD-10-CM | POA: Insufficient documentation

## 2016-06-13 DIAGNOSIS — I25110 Atherosclerotic heart disease of native coronary artery with unstable angina pectoris: Secondary | ICD-10-CM | POA: Insufficient documentation

## 2016-06-13 DIAGNOSIS — I252 Old myocardial infarction: Secondary | ICD-10-CM | POA: Diagnosis not present

## 2016-06-13 DIAGNOSIS — F17210 Nicotine dependence, cigarettes, uncomplicated: Secondary | ICD-10-CM | POA: Insufficient documentation

## 2016-06-13 DIAGNOSIS — F411 Generalized anxiety disorder: Secondary | ICD-10-CM | POA: Insufficient documentation

## 2016-06-13 DIAGNOSIS — E785 Hyperlipidemia, unspecified: Secondary | ICD-10-CM | POA: Diagnosis not present

## 2016-06-13 DIAGNOSIS — Z7902 Long term (current) use of antithrombotics/antiplatelets: Secondary | ICD-10-CM | POA: Insufficient documentation

## 2016-06-13 DIAGNOSIS — R0789 Other chest pain: Secondary | ICD-10-CM | POA: Diagnosis not present

## 2016-06-13 DIAGNOSIS — Z89612 Acquired absence of left leg above knee: Secondary | ICD-10-CM | POA: Diagnosis not present

## 2016-06-13 DIAGNOSIS — I2510 Atherosclerotic heart disease of native coronary artery without angina pectoris: Secondary | ICD-10-CM

## 2016-06-13 DIAGNOSIS — R079 Chest pain, unspecified: Secondary | ICD-10-CM

## 2016-06-13 DIAGNOSIS — R059 Cough, unspecified: Secondary | ICD-10-CM

## 2016-06-13 HISTORY — DX: Other specified congenital deformities of feet: Q66.89

## 2016-06-13 HISTORY — DX: Acquired absence of unspecified leg above knee: Z89.619

## 2016-06-13 HISTORY — DX: Endocarditis, valve unspecified: I38

## 2016-06-13 LAB — COMPREHENSIVE METABOLIC PANEL
ALBUMIN: 3.7 g/dL (ref 3.5–5.0)
ALK PHOS: 128 U/L — AB (ref 38–126)
ALT: 18 U/L (ref 17–63)
ANION GAP: 8 (ref 5–15)
AST: 19 U/L (ref 15–41)
BUN: 7 mg/dL (ref 6–20)
CALCIUM: 10.2 mg/dL (ref 8.9–10.3)
CO2: 28 mmol/L (ref 22–32)
Chloride: 104 mmol/L (ref 101–111)
Creatinine, Ser: 1.12 mg/dL (ref 0.61–1.24)
GFR calc Af Amer: >60 mL/min (ref >60)
GFR calc non Af Amer: >60 mL/min (ref >60)
GLUCOSE: 81 mg/dL (ref 65–99)
Potassium: 4.3 mmol/L (ref 3.5–5.1)
SODIUM: 140 mmol/L (ref 135–145)
Total Bilirubin: 0.6 mg/dL (ref 0.3–1.2)
Total Protein: 7.4 g/dL (ref 6.5–8.1)

## 2016-06-13 LAB — CBC WITH DIFFERENTIAL/PLATELET
Basophils Absolute: 0 10*3/uL (ref 0.0–0.1)
Basophils Relative: 0 %
Eosinophils Absolute: 0.4 10*3/uL (ref 0.0–0.7)
Eosinophils Relative: 4 %
HEMATOCRIT: 43.5 % (ref 39.0–52.0)
HEMOGLOBIN: 14.5 g/dL (ref 13.0–17.0)
LYMPHS ABS: 3.6 10*3/uL (ref 0.7–4.0)
LYMPHS PCT: 35 %
MCH: 31.7 pg (ref 26.0–34.0)
MCHC: 33.3 g/dL (ref 30.0–36.0)
MCV: 95 fL (ref 78.0–100.0)
MONO ABS: 1 10*3/uL (ref 0.1–1.0)
MONOS PCT: 9 %
NEUTROS ABS: 5.4 10*3/uL (ref 1.7–7.7)
NEUTROS PCT: 52 %
Platelets: 384 10*3/uL (ref 150–400)
RBC: 4.58 MIL/uL (ref 4.22–5.81)
RDW: 13.5 % (ref 11.5–15.5)
WBC: 10.3 10*3/uL (ref 4.0–10.5)

## 2016-06-13 LAB — I-STAT TROPONIN, ED: Troponin i, poc: 0 ng/mL (ref 0.00–0.08)

## 2016-06-13 MED ORDER — ALBUTEROL SULFATE (2.5 MG/3ML) 0.083% IN NEBU
5.0000 mg | INHALATION_SOLUTION | Freq: Once | RESPIRATORY_TRACT | Status: AC
  Administered 2016-06-13: 5 mg via RESPIRATORY_TRACT
  Filled 2016-06-13: qty 6

## 2016-06-13 MED ORDER — IPRATROPIUM BROMIDE 0.02 % IN SOLN
0.5000 mg | Freq: Once | RESPIRATORY_TRACT | Status: AC
  Administered 2016-06-13: 0.5 mg via RESPIRATORY_TRACT
  Filled 2016-06-13: qty 2.5

## 2016-06-13 MED ORDER — NITROGLYCERIN 0.4 MG SL SUBL
0.4000 mg | SUBLINGUAL_TABLET | SUBLINGUAL | Status: DC | PRN
  Administered 2016-06-14 (×2): 0.4 mg via SUBLINGUAL
  Filled 2016-06-13 (×2): qty 1

## 2016-06-13 MED ORDER — LORAZEPAM 1 MG PO TABS
0.5000 mg | ORAL_TABLET | Freq: Once | ORAL | Status: AC
  Administered 2016-06-14: 0.5 mg via ORAL
  Filled 2016-06-13: qty 1

## 2016-06-13 NOTE — ED Provider Notes (Signed)
[facility] DEPT Provider Note   CSN: [id number] Arrival date & time: 06/13/16  2220  First Provider Contact:  First MD Initiated Contact with Patient 06/13/16 2245        History   Chief Complaint Chief Complaint  Patient presents with  . Chest Pain    HPI Aaron Dodson is a 50 y.o. male.  Aaron Dodson is a 50 y.o. Male who was released from prison 4 days ago with a hx of MI (on plavix and stent x10), HTN PTSD, anxiety hyperlipidemia presents to the Emergency Department complaining of gradual, persistent, progressively worsening sharp chest pain onset 4pm this afternoon. Associated symptoms include cough.  Denies fever, chills, headache, neck pain, SOB, abd pain, N/V/D, weakness, dizziness, syncope.  No aggravating or alleviating factors.   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  He continues to smoke.  Pt reports he does not typically have chest pain.  He does not know if this pain is the same as his previous MIs.  Pt reports he initially thought this was anxiety but his Xanax did not help.     The history is provided by the patient and medical records. No language interpreter was used.      Record Review - Left Heart Cath January 2017: Conclusion   1. Triple vessel CAD 2. The LAD has mild plaque in the proximal segment and a patent stent in the distal/apical segment without restenosis.  3. The Circumflex has an ostial stent that extends into OM1. There is 40% ostial narrowing within the stent. The continuation of the AV groove Circumflex beyond OM1 is jailed by the stent with 50-70% stenosis. This continuation branch of the AV groove Circumflex is small in caliber.  4. The RCA has patent stents extending from the ostium of the vessel down into the PDA. There is diffuse mild stent restenosis.  5. The left main has a distal 30% stenosis.  6. Normal LV systolic function  Recommendations: Continue medical management of CAD. He can be discharged tonight after bedrest.      Past Medical History:  Diagnosis Date  . ADD (attention deficit disorder)   . Anxiety   . Chronic lower back pain   . Coronary artery disease   . GERD (gastroesophageal reflux disease)   . Hyperlipidemia   . Hypertension   . Myocardial infarction [facility]) 2000's - ~ 04/2015   "I've had a total of 3" (11/18/2015)  . PTSD (post-traumatic stress disorder)   . Stroke [facility]) ~ 2006   denies residual on 11/18/2015    Patient Active Problem List   Diagnosis Date Noted  . Unstable angina ([place]) 11/26/2015  . Coronary artery disease involving native coronary artery of native heart with unstable angina pectoris ([place])   . CAD S/P - s/p RCA/ CFX 11/19/2015  . Chest pain with moderate risk for cardiac etiology   . Anxiety state   . Chest pain 11/18/2015  . HLD (hyperlipidemia) 11/18/2015  . Essential hypertension 11/18/2015  . Tobacco abuse 11/18/2015    Past Surgical History:  Procedure Laterality Date  . ABOVE KNEE LEG AMPUTATION Left   . CARDIAC CATHETERIZATION  "several"  . CARDIAC CATHETERIZATION N/A 11/26/2015   Procedure: Left Heart Cath and Coronary Angiography;  Surgeon: Burnell Blanks, MD;  Location: [place] CV LAB;  Service: Cardiovascular;  Laterality: N/A;  . CLUB FOOT RELEASE Left 1967/08/17  . CORONARY ANGIOPLASTY  "several"  . CORONARY ANGIOPLASTY WITH STENT PLACEMENT  "several"   "  total of 10 stents placed" (11/18/2015)  . LAPAROSCOPIC CHOLECYSTECTOMY    . ORTHOPEDIC SURGERY  1967/07/08-~ 2007   "total of 52 on my left leg; started w/club foot released"  . TONSILLECTOMY         Home Medications    Prior to Admission medications   Medication Sig Start Date End Date Taking? Authorizing Provider  ALPRAZolam Duanne Moron) 1 MG tablet Take 1 mg by mouth 3 (three) times daily.    Historical Provider, MD  clopidogrel (PLAVIX) 75 MG tablet Take 1 tablet (75 mg total) by mouth daily. 11/22/15   Geradine Girt, DO  nebivolol (BYSTOLIC) 10 MG tablet Take 1 tablet (10 mg  total) by mouth daily. 11/22/15   Geradine Girt, DO  oxyCODONE-acetaminophen (PERCOCET) 10-325 MG tablet Take 1 tablet by mouth every 4 (four) hours as needed for pain.    Historical Provider, MD  pantoprazole (PROTONIX) 40 MG tablet Take 1 tablet (40 mg total) by mouth daily. 11/22/15   Geradine Girt, DO  ranolazine (RANEXA) 500 MG 12 hr tablet Take 1 tablet (500 mg total) by mouth daily. 11/22/15   Geradine Girt, DO  rosuvastatin (CRESTOR) 5 MG tablet Take 1 tablet (5 mg total) by mouth daily at 6 PM. 11/22/15   Geradine Girt, DO    Family History Family History  Problem Relation Age of Onset  . Hypertension Father   . Lung cancer Father     Social History Social History  Substance Use Topics  . Smoking status: Current Every Day Smoker    Packs/day: 1.50    Years: 35.00    Types: Cigarettes  . Smokeless tobacco: Never Used  . Alcohol use No     Allergies   Adipex-p [phentermine]; Benadryl [diphenhydramine hcl]; Promethazine hcl; Augmentin [amoxicillin-pot clavulanate]; and Toradol [ketorolac tromethamine]   Review of Systems Review of Systems  Respiratory: Positive for cough.   Cardiovascular: Positive for chest pain.  All other systems reviewed and are negative.    Physical Exam Updated Vital Signs BP 128/89   Pulse 81   Temp 98.2 F (36.8 C) (Oral)   Resp 25   Ht 6' (1.829 m)   Wt 74.8 kg   SpO2 98%   BMI 22.38 kg/m   Physical Exam  Constitutional: He appears well-developed and well-nourished. No distress.  Awake, alert, nontoxic appearance  HENT:  Head: Normocephalic and atraumatic.  Mouth/Throat: Oropharynx is clear and moist. No oropharyngeal exudate.  Eyes: Conjunctivae are normal. No scleral icterus.  Neck: Normal range of motion. Neck supple.  Cardiovascular: Normal rate, regular rhythm and intact distal pulses.   Pulmonary/Chest: Effort normal. No respiratory distress. He has wheezes ( with cough only).  Equal chest expansion Cough with  expiratory wheeze, clear breath sounds when not coughing.    Abdominal: Soft. Bowel sounds are normal. He exhibits no mass. There is no tenderness. There is no rebound and no guarding.  Musculoskeletal: Normal range of motion. He exhibits no edema.  Left Leg BKA  Neurological: He is alert.  Speech is clear and goal oriented Moves extremities without ataxia  Skin: Skin is warm and dry. He is not diaphoretic.  Psychiatric: His mood appears anxious.  Nursing note and vitals reviewed.    ED Treatments / Results  Labs (all labs ordered are listed, but only abnormal results are displayed) Labs Reviewed  COMPREHENSIVE METABOLIC PANEL - Abnormal; Notable for the following:       Result Value   Alkaline  Phosphatase 128 (*)    All other components within normal limits  CBC WITH DIFFERENTIAL/PLATELET  URINE RAPID DRUG SCREEN, HOSP PERFORMED  I-STAT TROPOININ, ED    EKG  EKG Interpretation  Date/Time:                                      Sunday June 13 2016 22:26:54 EDT Ventricular Rate:             88 PR Interval:                                            QRS Duration:                  84 QT Interval:                                          334 QTC Calculation:              404 R Axis:                                                       63  Text Interpretation:            Normal sinus rhythm Normal ECG When compared with ECG of 11/26/2015, No significant change was found Confirmed by [facility]  MD, DAVID ([id number]) on 06/13/2016 11:03:40 PM    EKG Interpretation  Date/Time:  Sunday June 13 2016 23:00:38 EDT Ventricular Rate:  84 PR Interval:    QRS Duration: 91 QT Interval:  346 QTC Calculation: 409 R Axis:   63 Text Interpretation:  Sinus rhythm Normal ECG No significant change since last tracing Confirmed by [facility]  MD, DAVID ([id number]) on 06/13/2016 11:04:45 PM       Radiology Dg Chest 2 View  Result Date: 06/13/2016 CLINICAL DATA:  Chronic central chest pain and cough.  Initial encounter. EXAM: CHEST  2 VIEW COMPARISON:  Chest radiograph from 11/25/2015 FINDINGS: The lungs are well-aerated and clear. There is no evidence of focal opacification, pleural effusion or pneumothorax. The heart is normal in size; the mediastinal contour is within normal limits. Coronary artery calcifications are seen. No acute osseous abnormalities are seen. IMPRESSION: No acute cardiopulmonary process seen. Electronically Signed   By: Garald Balding M.D.   On: 06/13/2016 23:28    Procedures Procedures (including critical care time)  Medications Ordered in ED Medications  nitroGLYCERIN (NITROSTAT) SL tablet 0.4 mg (0.4 mg Sublingual Given 06/14/16 0021)  albuterol (PROVENTIL) (2.5 MG/3ML) 0.083% nebulizer solution 5 mg (5 mg Nebulization Given 06/13/16 2359)  ipratropium (ATROVENT) nebulizer solution 0.5 mg (0.5 mg Nebulization Given 06/13/16 2359)  LORazepam (ATIVAN) tablet 0.5 mg (0.5 mg Oral Given 06/14/16 0000)     Initial Impression / Assessment and Plan / ED Course  I have reviewed the triage vital signs and the nursing notes.  Pertinent labs & imaging results that were available during my care of the patient were reviewed by me and considered in my medical decision making (see chart  for details).  Clinical Course  Value Comment By Time   No ASA as pt has NSAID allergy Abigail Butts, PA-C 08/06 2251  BP: (!) 151/106 Hypertensive without tachycardia Abigail Butts, PA-C 08/06 2251  EKG 12-Lead Baseline wander. Will repeat EKG Abigail Butts, PA-C 08/06 2326  EKG 12-Lead No change from previous EKG. Jarrett Soho Deiontae Rabel, PA-C 08/06 2327  Troponin i, poc: 0.00 Negative troponin. Labs otherwise unremarkable. Jarrett Soho Lamont Tant, PA-C 08/06 2327  BP: 128/89 Hypertension resolving spontaneously. Abigail Butts, PA-C 08/06 2328   Patient with significant improvement in cough and reports he is breathing much easier after albuterol treatment.  No significant  improvement in his chest pain after Ativan and nitroglycerin. Jarrett Soho Nusaiba Guallpa, PA-C 08/07 0034  DG Chest 2 View No evidence of pneumonia. No cardiomegaly. Abigail Butts, PA-C 08/07 [phone]   Discussed with Dr. Loleta Books who will admit.  Recommends Tele Obs Abigail Butts, [place] 08/07 [phone]    Patient with persistent chest pain throughout the afternoon and evening. Initial troponin negative. No change with nitroglycerin or Ativan. Improved reading and decreased cough after albuterol. Patient will need observation admission for chest pain rule out as he has significant cardiac disease.  Final Clinical Impressions(s) / ED Diagnoses   Final diagnoses:  Chest pain, unspecified chest pain type  Cough    New Prescriptions New Prescriptions   No medications on file     Abigail Butts, PA-C [id number]    Delora Fuel, MD [id number] [id number]

## 2016-06-13 NOTE — ED Notes (Signed)
Patient transported to X-ray 

## 2016-06-14 ENCOUNTER — Encounter (HOSPITAL_COMMUNITY): Payer: Self-pay | Admitting: Family Medicine

## 2016-06-14 DIAGNOSIS — R079 Chest pain, unspecified: Secondary | ICD-10-CM | POA: Diagnosis not present

## 2016-06-14 DIAGNOSIS — I10 Essential (primary) hypertension: Secondary | ICD-10-CM

## 2016-06-14 DIAGNOSIS — I2510 Atherosclerotic heart disease of native coronary artery without angina pectoris: Secondary | ICD-10-CM

## 2016-06-14 DIAGNOSIS — Z720 Tobacco use: Secondary | ICD-10-CM

## 2016-06-14 DIAGNOSIS — F411 Generalized anxiety disorder: Secondary | ICD-10-CM | POA: Diagnosis not present

## 2016-06-14 DIAGNOSIS — Z9861 Coronary angioplasty status: Secondary | ICD-10-CM

## 2016-06-14 DIAGNOSIS — R0789 Other chest pain: Secondary | ICD-10-CM | POA: Diagnosis not present

## 2016-06-14 LAB — TROPONIN I: Troponin I: <0.03 ng/mL (ref <0.03)

## 2016-06-14 LAB — RAPID URINE DRUG SCREEN, HOSP PERFORMED
Amphetamines: NOT DETECTED
BARBITURATES: NOT DETECTED
Benzodiazepines: NOT DETECTED
COCAINE: NOT DETECTED
Opiates: NOT DETECTED
TETRAHYDROCANNABINOL: NOT DETECTED

## 2016-06-14 MED ORDER — ONDANSETRON HCL 4 MG/2ML IJ SOLN: 4.0000 mg | Freq: Four times a day (QID) | INTRAMUSCULAR | Status: DC | PRN

## 2016-06-14 MED ORDER — ASPIRIN 325 MG PO TABS
325.0000 mg | ORAL_TABLET | Freq: Once | ORAL | Status: AC
  Administered 2016-06-14: 325 mg via ORAL
  Filled 2016-06-14: qty 1

## 2016-06-14 MED ORDER — GI COCKTAIL ~~LOC~~: 30.0000 mL | Freq: Four times a day (QID) | ORAL | Status: DC | PRN

## 2016-06-14 MED ORDER — MORPHINE SULFATE (PF) 4 MG/ML IV SOLN
4.0000 mg | Freq: Once | INTRAVENOUS | Status: AC
  Administered 2016-06-14: 4 mg via INTRAVENOUS
  Filled 2016-06-14: qty 1

## 2016-06-14 MED ORDER — NEBIVOLOL HCL 5 MG PO TABS: 10.0000 mg | ORAL_TABLET | Freq: Every day | ORAL | Status: DC

## 2016-06-14 MED ORDER — MORPHINE SULFATE (PF) 2 MG/ML IV SOLN
2.0000 mg | INTRAVENOUS | Status: DC | PRN
  Administered 2016-06-14: 2 mg via INTRAVENOUS
  Filled 2016-06-14: qty 1

## 2016-06-14 MED ORDER — ASPIRIN EC 81 MG PO TBEC: 81.0000 mg | DELAYED_RELEASE_TABLET | Freq: Every day | ORAL | Status: DC

## 2016-06-14 MED ORDER — ACETAMINOPHEN 325 MG PO TABS: 650.0000 mg | ORAL_TABLET | ORAL | Status: DC | PRN

## 2016-06-14 MED ORDER — ROSUVASTATIN CALCIUM 10 MG PO TABS: 20.0000 mg | ORAL_TABLET | Freq: Every day | ORAL | Status: DC

## 2016-06-14 NOTE — H&P (Signed)
History and Physical  Patient Name: Aaron Dodson     [id number]    DOB: 1966-10-27    DOA: 06/13/2016 PCP: Pcp Not In System   Patient coming from: Home     Chief Complaint: Chest pain  HPI: Aaron Dodson is a 50 y.o. male with a past medical history significant for CAD s/p PCI numerous times, HTN, previous IVDU and endocarditis in 2016 and anxiety who presents with chest pain.  The patient was in prison from Jan 2017 til one week ago, not being treated with his Bystolic, Plavix or antihypertensives (his last DES was 04/16/2015).    Since leaving prison, he notes "a lot" of anxiety, and has been self-medicating with left-over (or his wife's supply of) Xanax for anxiety and then chest discomfort which he thought was anxiety.  Today, starting around 2PM he had onset of moderate intensity, sharp, upper central chest pain (top of sternum) radiating to the back, worse with inspiration, not exertional.  At dinner he took a Xanax which did not help, and around 7PM he complained of the pain to his wife who recommended he seek evaluation.  ED course: -Afebrile, heart rate normal, respirations 25, hypertensive, saturating 100% on room air -Initial ECG showed normal sinus rhythm and troponin was negative. -Na 140, K 4.3, Cr 1.12 (baseline), WBC 10.3, Hgb 14 -CXR was normal -He was given aspirin 325 and nitroglycerin without relief -TRH was asked to admit for observation, serial troponins and risk stratification.  Of note, he was admitted for chest pain three times here in January shortly before going to prison.  Had a negative stress test and atypical symptoms but eventually underwent LHC when his extensive records were obtained from [place], [place] and [facility] over ten times, repeat stenting to the LCx, RCA and LAD).  This showed 3 vessel disease and he was continued on high dose statin, aspirin, and BB.  Of note as well, the patient did have endocarditis (polymicrobial) in June of 2016, treated at  [facility].  He reports no recent fever, night sweats, weight loss, and has not used IV drugs for over one year.     Review of Systems:  Pt complains of chest pain, anxiety. Pt denies any fever, chills, night sweats, weight loss.  Denies leg swelling, stump swelling, dyspnea, pleuritic pain, hemoptysis, recent surgery.  All other systems negative except as just noted or noted in the history of present illness.  Past Medical History:  Diagnosis Date  . ADD (attention deficit disorder)   . Anxiety   . Chronic lower back pain   . Coronary artery disease   . GERD (gastroesophageal reflux disease)   . Hyperlipidemia   . Hypertension   . Myocardial infarction [facility]) 2000's - ~ 04/2015   "I've had a total of 3" (11/18/2015)  . PTSD (post-traumatic stress disorder)   . Stroke [facility]) ~ 2006   denies residual on 11/18/2015    Past Surgical History:  Procedure Laterality Date  . ABOVE KNEE LEG AMPUTATION Left   . CARDIAC CATHETERIZATION  "several"  . CARDIAC CATHETERIZATION N/A 11/26/2015   Procedure: Left Heart Cath and Coronary Angiography;  Surgeon: Burnell Blanks, MD;  Location: [facility];  Service: Cardiovascular;  Laterality: N/A;  . CLUB FOOT RELEASE Left 27-Nov-1966  . CORONARY ANGIOPLASTY  "several"  . CORONARY ANGIOPLASTY WITH STENT PLACEMENT  "several"   "total of 10 stents placed" (11/18/2015)  . LAPAROSCOPIC CHOLECYSTECTOMY    . ORTHOPEDIC SURGERY  1967/03/15-~  2007   "total of 52 on my left leg; started w/club foot released"  . TONSILLECTOMY      Social History: Patient lives with his wife.  Patient walks unassisted. He smokes now.      Allergies  Allergen Reactions  . Adipex-P [Phentermine]     Aggressive behavior  . Benadryl [Diphenhydramine Hcl]     Aggressive behavior  . Promethazine Hcl     Unknown - given for surgical procedure and advised to never take again  . Augmentin [Amoxicillin-Pot Clavulanate] Diarrhea and Nausea Only  . Toradol  [Ketorolac Tromethamine] Hives    Redness    Family history: family history includes Hypertension in his father; Lung cancer in his father.  Prior to Admission medications   Medication Sig Start Date End Date Taking? Authorizing Provider  ALPRAZolam Duanne Moron) 1 MG tablet Take 1 mg by mouth 3 (three) times daily.    Historical Provider, MD  clopidogrel (PLAVIX) 75 MG tablet Take 1 tablet (75 mg total) by mouth daily. 11/22/15   Geradine Girt, DO  nebivolol (BYSTOLIC) 10 MG tablet Take 1 tablet (10 mg total) by mouth daily. 11/22/15   Geradine Girt, DO  oxyCODONE-acetaminophen (PERCOCET) 10-325 MG tablet Take 1 tablet by mouth every 4 (four) hours as needed for pain.    Historical Provider, MD  pantoprazole (PROTONIX) 40 MG tablet Take 1 tablet (40 mg total) by mouth daily. 11/22/15   Geradine Girt, DO  ranolazine (RANEXA) 500 MG 12 hr tablet Take 1 tablet (500 mg total) by mouth daily. 11/22/15   Geradine Girt, DO  rosuvastatin (CRESTOR) 5 MG tablet Take 1 tablet (5 mg total) by mouth daily at 6 PM. 11/22/15   Geradine Girt, DO       Physical Exam: BP 128/89   Pulse 81   Temp 98.2 F (36.8 C) (Oral)   Resp 25   Ht 6' (1.829 m)   Wt 74.8 kg (165 lb)   SpO2 98%   BMI 22.38 kg/m  General appearance: Well-developed, adult male, alert and in no acute distress, appears anxious.   Eyes: Anicteric, conjunctiva pink, lids and lashes normal.     ENT: No nasal deformity, discharge, or epistaxis.  OP moist without lesions.   Skin: Warm and dry.   No rashes on hands.   Cardiac: RRR, nl S1-S2, no murmurs appreciated.  Capillary refill is brisk.  JVP normal.  No LE edema.  Radial pulses 2+ and symmetric.  RIGHT DP pulse normal.  No carotid bruits. Respiratory: Normal respiratory rate and rhythm.  CTAB without rales or wheezes. GI: Abdomen soft without rigidity.  No TTP. No ascites, distension.   MSK: No deformities or effusions.   Pain not reproduced with palpation of precordium.  No pain with  arm movement.  Left leg amputated above kene. Neuro: Sensorium intact and responding to questions, attention normal.  Speech is fluent.  Moves all extremities equally and with normal coordination.    Psych: Behavior appropriate.  Affect normal.  No evidence of aural or visual hallucinations or delusions.       Labs on Admission:  The metabolic panel shows normal electrolytes and renal function. The complete blood count shows no anemia, leukocytosis or thrombocytopenia. The initial troponin is negative.  Radiological Exams on Admission: Personally reviewed: Dg Chest 2 View  Result Date: 06/13/2016 CLINICAL DATA:  Chronic central chest pain and cough. Initial encounter. EXAM: CHEST  2 VIEW COMPARISON:  Chest radiograph from 11/25/2015  FINDINGS: The lungs are well-aerated and clear. There is no evidence of focal opacification, pleural effusion or pneumothorax. The heart is normal in size; the mediastinal contour is within normal limits. Coronary artery calcifications are seen. No acute osseous abnormalities are seen. IMPRESSION: No acute cardiopulmonary process seen. Electronically Signed   By: Garald Balding M.D.   On: 06/13/2016 23:28    EKG: Independently reviewed. Rate 84, QTC 409, no ST segment changes.    Assessment/Plan 1. Chest pain: This is new.  The patient has HEART score of 4. The pain is atypical.  Negative PERC rule for PE.  Other potential causes of chest pain (dissection, pancreatitis, pneumonia/effusion, pericarditis) are doubted.  We have been asked to admit the patient for observation and etiology consultation with Cardiology tomorrow.  -Serial troponins are ordered -Telemetry -Consult to cardiology, appreciate recommendations -Smoking cessation was recommended -Restart Crestor 20 mg daily -Restart aspirin -Low suspicion that chest pain represents endocarditis, but will obtain one blood culture   2. HTN:  -Restart Bystolic -Start ACEi when he has PCP follow up for  lab work  3. Anxiety:  -Discussed SSRI therapy, strongly encouraged -No alprazolam      DVT prophylaxis: None, outpatient status Diet: Heart heatlhy Code Status: Full  Family Communication: None present  Disposition Plan: Anticipate overnight observation for arrhythmia on telemetry, serial troponins.  If testing negative, home after with Cardiology follow up as an outpatient for medical optimization. Consults called: None overnight Admission status: Telemetry, OBS   Medical decision making: Patient seen at 1:21 AM on 06/14/2016.  The patient was discussed with Abigail Butts, PA-C. What exists of the patient's chart and extensive outside records in [facility] was reviewed in depth.  Clinical condition: stable.      Edwin Dada [facility] Pager [phone]

## 2016-06-19 LAB — CULTURE, BLOOD (SINGLE): Culture: NO GROWTH

## 2016-07-01 NOTE — Discharge Summary (Signed)
Aaron Dodson, is a 50 y.o. male  DOB 12-21-1965  MRN [id number].  Admission date:  06/13/2016  Admitting Physician  Edwin Dada, MD  Discharge Date:  06/14/2016  Primary MD  Pcp Not In System  Recommendations for primary care physician for things to follow:   Cp Pt may need stress testing, pt left before arrangements could be made   Admission Diagnosis  Cough [R05] Chest pain, unspecified chest pain type [R07.9]   Discharge Diagnosis  Cough [R05] Chest pain, unspecified chest pain type [R07.9]    Principal Problem:   Chest pain Active Problems:   Essential hypertension   Tobacco abuse   CAD S/P PCI   Anxiety state      Past Medical History:  Diagnosis Date  . ADD (attention deficit disorder)   . Anxiety   . Chronic lower back pain   . Club foot   . Coronary artery disease   . Endocarditis    At [facility] 2016  . GERD (gastroesophageal reflux disease)   . Hyperlipidemia   . Hypertension   . Myocardial infarction [facility]) 2000's - ~ 04/2015   "I've had a total of 3" (11/18/2015)  . PTSD (post-traumatic stress disorder)   . 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 Stroke [facility]) ~ 2006   denies residual on 11/18/2015    Past Surgical History:  Procedure Laterality Date  . ABOVE KNEE LEG AMPUTATION Left   . CARDIAC CATHETERIZATION  "several"  . CARDIAC CATHETERIZATION N/A 11/26/2015   Procedure: Left Heart Cath and Coronary Angiography;  Surgeon: Burnell Blanks, MD;  Location: [facility];  Service: Cardiovascular;  Laterality: N/A;  . CLUB FOOT RELEASE Left 01/07/1967  . CORONARY ANGIOPLASTY  "several"  . CORONARY ANGIOPLASTY WITH STENT PLACEMENT  "several"   "total of 10 stents placed" (11/18/2015)  . LAPAROSCOPIC CHOLECYSTECTOMY    . ORTHOPEDIC SURGERY  12/28/1966-~ 2007   "total of 52 on my left leg; started w/club foot released"  .  TONSILLECTOMY         HPI  from the history and physical done on the day of admission:     50 y.o. male with a past medical history significant for CAD s/p PCI numerous times, HTN, previous IVDU and endocarditis in 2016 and anxiety who presents with chest pain.  The patient was in prison from Jan 2017 til one week ago, not being treated with his Bystolic, Plavix or antihypertensives (his last DES was 04/16/2015).    Since leaving prison, he notes "a lot" of anxiety, and has been self-medicating with left-over (or his wife's supply of) Xanax for anxiety and then chest discomfort which he thought was anxiety.  Today, starting around 2PM he had onset of moderate intensity, sharp, upper central chest pain (top of sternum) radiating to the back, worse with inspiration, not exertional.  At dinner he took a Xanax which did not help, and around 7PM he complained of the pain to his wife who  recommended he seek evaluation.  ED course: -Afebrile, heart rate normal, respirations 25, hypertensive, saturating 100% on room air -Initial ECG showed normal sinus rhythm and troponin was negative. -Na 140, K 4.3, Cr 1.12 (baseline), WBC 10.3, Hgb 14 -CXR was normal -He was given aspirin 325 and nitroglycerin without relief -TRH was asked to admit for observation, serial troponins and risk stratification.  Of note, he was admitted for chest pain three times here in January shortly before going to prison.  Had a negative stress test and atypical symptoms but eventually underwent LHC when his extensive records were obtained from [place], [place] and [facility] over ten times, repeat stenting to the LCx, RCA and LAD).  This showed 3 vessel disease and he was continued on high dose statin, aspirin, and BB.  Of note as well, the patient did have endocarditis (polymicrobial) in June of 2016, treated at [facility].  He reports no recent fever, night sweats, weight loss, and has not used IV drugs for over one  year.     Hospital Course:     Pt admitted for w/up of chest pain,  Trop negative. CXR wnl.  Pt left AMA   Follow UP     Consults obtained -   Discharge Condition: stable  Diet and Activity recommendation: See Discharge Instructions below  Discharge Instructions        Discharge Medications       Medication List    ASK your doctor about these medications   ALPRAZolam 1 MG tablet Commonly known as:  XANAX Take 1 mg by mouth 3 (three) times daily.   metoprolol succinate 25 MG 24 hr tablet Commonly known as:  TOPROL-XL Take 25 mg by mouth daily.   oxycodone 30 MG immediate release tablet Commonly known as:  ROXICODONE Take 30 mg by mouth 3 (three) times daily.       Major procedures and Radiology Reports - PLEASE review detailed and final reports for all details, in brief -      Dg Chest 2 View  Result Date: 06/13/2016 CLINICAL DATA:  Chronic central chest pain and cough. Initial encounter. EXAM: CHEST  2 VIEW COMPARISON:  Chest radiograph from 11/25/2015 FINDINGS: The lungs are well-aerated and clear. There is no evidence of focal opacification, pleural effusion or pneumothorax. The heart is normal in size; the mediastinal contour is within normal limits. Coronary artery calcifications are seen. No acute osseous abnormalities are seen. IMPRESSION: No acute cardiopulmonary process seen. Electronically Signed   By: Garald Balding M.D.   On: 06/13/2016 23:28    Micro Results     No results found for this or any previous visit (from the past 240 hour(s)).     Today   Subjective    Kmari Mom today has no headache,no chest abdominal pain,no new weakness tingling or numbness, feels much better wants to go home today. Left AMA   Objective   Blood pressure 98/63, pulse 76, temperature 98 F (36.7 C), temperature source Oral, resp. rate 16, height 6' (1.829 m), weight 76.3 kg (168 lb 3.2 oz), SpO2 100 %.  No intake or output data in the 24 hours  ending 07/01/16 0803  Exam Awake Alert, Oriented x 3, No new F.N deficits, Normal affect [place].AT,PERRAL Supple Neck,No JVD, No cervical lymphadenopathy appriciated.  Symmetrical Chest wall movement, Good air movement bilaterally, CTAB RRR,No Gallops,Rubs or new Murmurs, No Parasternal Heave +ve B.Sounds, Abd Soft, Non tender, No organomegaly appriciated, No rebound -guarding or rigidity. No  Cyanosis, Clubbing or edema, No new Rash or bruise   Data Review   CBC w Diff:  Lab Results  Component Value Date   WBC 10.3 06/13/2016   HGB 14.5 06/13/2016   HCT 43.5 06/13/2016   PLT 384 06/13/2016   LYMPHOPCT 35 06/13/2016   MONOPCT 9 06/13/2016   EOSPCT 4 06/13/2016   BASOPCT 0 06/13/2016    CMP:  Lab Results  Component Value Date   NA 140 06/13/2016   K 4.3 06/13/2016   CL 104 06/13/2016   CO2 28 06/13/2016   BUN 7 06/13/2016   CREATININE 1.12 06/13/2016   PROT 7.4 06/13/2016   ALBUMIN 3.7 06/13/2016   BILITOT 0.6 06/13/2016   ALKPHOS 128 (H) 06/13/2016   AST 19 06/13/2016   ALT 18 06/13/2016  .   Total Time in preparing paper work, data evaluation and todays exam - 75 minutes  Jani Gravel M.D on 06/14/2016   at 8:03 AM  [facility]   Office  [phone]

## 2016-07-21 ENCOUNTER — Emergency Department (HOSPITAL_COMMUNITY): Payer: Medicare Other

## 2016-07-21 ENCOUNTER — Encounter (HOSPITAL_COMMUNITY): Payer: Self-pay | Admitting: Emergency Medicine

## 2016-07-21 DIAGNOSIS — G8929 Other chronic pain: Secondary | ICD-10-CM | POA: Diagnosis not present

## 2016-07-21 DIAGNOSIS — I10 Essential (primary) hypertension: Secondary | ICD-10-CM | POA: Insufficient documentation

## 2016-07-21 DIAGNOSIS — Z7982 Long term (current) use of aspirin: Secondary | ICD-10-CM | POA: Insufficient documentation

## 2016-07-21 DIAGNOSIS — Z79899 Other long term (current) drug therapy: Secondary | ICD-10-CM | POA: Diagnosis not present

## 2016-07-21 DIAGNOSIS — Z8673 Personal history of transient ischemic attack (TIA), and cerebral infarction without residual deficits: Secondary | ICD-10-CM | POA: Diagnosis not present

## 2016-07-21 DIAGNOSIS — R0789 Other chest pain: Principal | ICD-10-CM | POA: Insufficient documentation

## 2016-07-21 DIAGNOSIS — F411 Generalized anxiety disorder: Secondary | ICD-10-CM | POA: Diagnosis not present

## 2016-07-21 DIAGNOSIS — Z89612 Acquired absence of left leg above knee: Secondary | ICD-10-CM | POA: Insufficient documentation

## 2016-07-21 DIAGNOSIS — I2510 Atherosclerotic heart disease of native coronary artery without angina pectoris: Secondary | ICD-10-CM | POA: Insufficient documentation

## 2016-07-21 DIAGNOSIS — E785 Hyperlipidemia, unspecified: Secondary | ICD-10-CM | POA: Diagnosis not present

## 2016-07-21 DIAGNOSIS — I252 Old myocardial infarction: Secondary | ICD-10-CM | POA: Diagnosis not present

## 2016-07-21 DIAGNOSIS — F4310 Post-traumatic stress disorder, unspecified: Secondary | ICD-10-CM | POA: Insufficient documentation

## 2016-07-21 DIAGNOSIS — R079 Chest pain, unspecified: Secondary | ICD-10-CM | POA: Diagnosis present

## 2016-07-21 DIAGNOSIS — K219 Gastro-esophageal reflux disease without esophagitis: Secondary | ICD-10-CM | POA: Diagnosis not present

## 2016-07-21 DIAGNOSIS — Z955 Presence of coronary angioplasty implant and graft: Secondary | ICD-10-CM | POA: Diagnosis not present

## 2016-07-21 DIAGNOSIS — F17210 Nicotine dependence, cigarettes, uncomplicated: Secondary | ICD-10-CM | POA: Diagnosis not present

## 2016-07-21 DIAGNOSIS — Z880 Allergy status to penicillin: Secondary | ICD-10-CM | POA: Diagnosis not present

## 2016-07-21 LAB — BASIC METABOLIC PANEL
ANION GAP: 8 (ref 5–15)
BUN: 9 mg/dL (ref 6–20)
CALCIUM: 10 mg/dL (ref 8.9–10.3)
CHLORIDE: 103 mmol/L (ref 101–111)
CO2: 27 mmol/L (ref 22–32)
Creatinine, Ser: 1.12 mg/dL (ref 0.61–1.24)
GFR calc non Af Amer: >60 mL/min (ref >60)
Glucose, Bld: 101 mg/dL — ABNORMAL HIGH (ref 65–99)
Potassium: 3.8 mmol/L (ref 3.5–5.1)
Sodium: 138 mmol/L (ref 135–145)

## 2016-07-21 LAB — CBC
HCT: 42.9 % (ref 39.0–52.0)
HEMOGLOBIN: 14.7 g/dL (ref 13.0–17.0)
MCH: 32.3 pg (ref 26.0–34.0)
MCHC: 34.3 g/dL (ref 30.0–36.0)
MCV: 94.3 fL (ref 78.0–100.0)
Platelets: 317 10*3/uL (ref 150–400)
RBC: 4.55 MIL/uL (ref 4.22–5.81)
RDW: 14.5 % (ref 11.5–15.5)
WBC: 9.4 10*3/uL (ref 4.0–10.5)

## 2016-07-21 LAB — I-STAT TROPONIN, ED: TROPONIN I, POC: 0 ng/mL (ref 0.00–0.08)

## 2016-07-21 NOTE — ED Triage Notes (Signed)
Pt. reports central chest pain with mild SOB radiating to upper back onset yesterday , denies nausea or diaphoresis .

## 2016-07-22 ENCOUNTER — Encounter (HOSPITAL_COMMUNITY): Payer: Self-pay | Admitting: Family Medicine

## 2016-07-22 ENCOUNTER — Observation Stay (HOSPITAL_COMMUNITY)
Admission: EM | Admit: 2016-07-22 | Discharge: 2016-07-22 | Disposition: A | Discharge status: Home / Self Care | Payer: Medicare Other | Attending: Internal Medicine | Admitting: Internal Medicine

## 2016-07-22 DIAGNOSIS — F17200 Nicotine dependence, unspecified, uncomplicated: Secondary | ICD-10-CM | POA: Diagnosis present

## 2016-07-22 DIAGNOSIS — E785 Hyperlipidemia, unspecified: Secondary | ICD-10-CM | POA: Diagnosis not present

## 2016-07-22 DIAGNOSIS — I10 Essential (primary) hypertension: Secondary | ICD-10-CM | POA: Diagnosis not present

## 2016-07-22 DIAGNOSIS — I2510 Atherosclerotic heart disease of native coronary artery without angina pectoris: Secondary | ICD-10-CM

## 2016-07-22 DIAGNOSIS — F411 Generalized anxiety disorder: Secondary | ICD-10-CM

## 2016-07-22 DIAGNOSIS — R0789 Other chest pain: Secondary | ICD-10-CM | POA: Diagnosis not present

## 2016-07-22 DIAGNOSIS — R079 Chest pain, unspecified: Secondary | ICD-10-CM | POA: Diagnosis not present

## 2016-07-22 DIAGNOSIS — R0782 Intercostal pain: Secondary | ICD-10-CM

## 2016-07-22 DIAGNOSIS — Z720 Tobacco use: Secondary | ICD-10-CM

## 2016-07-22 LAB — MRSA PCR SCREENING: MRSA by PCR: POSITIVE — AB

## 2016-07-22 LAB — I-STAT TROPONIN, ED: TROPONIN I, POC: 0 ng/mL (ref 0.00–0.08)

## 2016-07-22 LAB — TROPONIN I: Troponin I: <0.03 ng/mL (ref <0.03)

## 2016-07-22 MED ORDER — MORPHINE SULFATE (PF) 4 MG/ML IV SOLN
4.0000 mg | Freq: Once | INTRAVENOUS | Status: AC
  Administered 2016-07-22: 4 mg via INTRAVENOUS
  Filled 2016-07-22: qty 1

## 2016-07-22 MED ORDER — IBUPROFEN 800 MG PO TABS: 800.0000 mg | ORAL_TABLET | Freq: Four times a day (QID) | ORAL | 0 refills | Status: DC | PRN

## 2016-07-22 MED ORDER — ROSUVASTATIN CALCIUM 10 MG PO TABS
5.0000 mg | ORAL_TABLET | Freq: Every day | ORAL | Status: DC
  Administered 2016-07-22: 5 mg via ORAL
  Filled 2016-07-22: qty 1

## 2016-07-22 MED ORDER — ASPIRIN 81 MG PO TBEC: 81.0000 mg | DELAYED_RELEASE_TABLET | Freq: Every day | ORAL | 0 refills | Status: DC

## 2016-07-22 MED ORDER — ASPIRIN EC 81 MG PO TBEC
81.0000 mg | DELAYED_RELEASE_TABLET | Freq: Every day | ORAL | Status: DC
  Administered 2016-07-22: 81 mg via ORAL
  Filled 2016-07-22: qty 1

## 2016-07-22 MED ORDER — ONDANSETRON HCL 4 MG/2ML IJ SOLN: 4.0000 mg | Freq: Four times a day (QID) | INTRAMUSCULAR | Status: DC | PRN

## 2016-07-22 MED ORDER — ASPIRIN 81 MG PO CHEW
324.0000 mg | CHEWABLE_TABLET | Freq: Once | ORAL | Status: AC
  Administered 2016-07-22: 324 mg via ORAL
  Filled 2016-07-22: qty 4

## 2016-07-22 MED ORDER — TRAMADOL HCL 50 MG PO TABS: 50.0000 mg | ORAL_TABLET | Freq: Four times a day (QID) | ORAL | Status: DC | PRN

## 2016-07-22 MED ORDER — HYDROCODONE-ACETAMINOPHEN 10-325 MG PO TABS
1.0000 | ORAL_TABLET | Freq: Four times a day (QID) | ORAL | Status: DC
  Administered 2016-07-22 (×2): 1 via ORAL
  Filled 2016-07-22 (×2): qty 1

## 2016-07-22 MED ORDER — ALPRAZOLAM 0.25 MG PO TABS: 0.2500 mg | ORAL_TABLET | Freq: Two times a day (BID) | ORAL | Status: DC | PRN

## 2016-07-22 MED ORDER — NICOTINE 14 MG/24HR TD PT24
14.0000 mg | MEDICATED_PATCH | Freq: Every day | TRANSDERMAL | Status: DC
  Administered 2016-07-22 (×2): 14 mg via TRANSDERMAL
  Filled 2016-07-22 (×2): qty 1

## 2016-07-22 MED ORDER — MORPHINE SULFATE (PF) 2 MG/ML IV SOLN
2.0000 mg | INTRAVENOUS | Status: DC | PRN
  Administered 2016-07-22: 2 mg via INTRAVENOUS
  Filled 2016-07-22: qty 1

## 2016-07-22 MED ORDER — ENOXAPARIN SODIUM 40 MG/0.4ML ~~LOC~~ SOLN: 40.0000 mg | SUBCUTANEOUS | Status: DC

## 2016-07-22 MED ORDER — ACETAMINOPHEN 325 MG PO TABS: 650.0000 mg | ORAL_TABLET | ORAL | Status: DC | PRN

## 2016-07-22 MED ORDER — CLOPIDOGREL BISULFATE 75 MG PO TABS: 75.0000 mg | ORAL_TABLET | Freq: Every day | ORAL | 0 refills | Status: DC

## 2016-07-22 MED ORDER — GI COCKTAIL ~~LOC~~: 30.0000 mL | Freq: Four times a day (QID) | ORAL | Status: DC | PRN

## 2016-07-22 NOTE — ED Provider Notes (Signed)
[facility] DEPT Provider Note   CSN: [id number] Arrival date & time: 07/21/16  1903  By signing my name below, I, Reola Mosher, attest that this documentation has been prepared under the direction and in the presence of Delora Fuel, MD. Electronically Signed: Reola Mosher, ED Scribe. 07/22/16. 12:58 AM.  History   Chief Complaint Chief Complaint  Patient presents with  . Chest Pain   The history is provided by the patient. No language interpreter was used.   HPI Comments: Aaron Dodson is a 50 y.o. male with a PMHx of endocarditis, MI x 3, anxiety, and CAD s/p stent placement, who presents to the Emergency Department complaining pressure-like, constant centralized chest pain onset ~1 day ago. Pt reports that his pain feels sharp, radiates into his middle, upper back and rates his pain as 5/10. He notes that his pain is exacerbated with movement. Pt reports that he is short of breath at baseline, but denies any acute changes recently. Pt took 2 NTG ~1 hour prior to coming into the ED with minimal relief of his symptoms. Pt also takes 81mg  Asprin every morning. He also states that for the past two days he has intermittently felt symptoms similar to his hx of AFIB. Denies sensation of feeling like he is in AFIB currently while in the ED. He additionally notes that his pain is currently similar to his pain in the past with his previous MIs. Denies nausea, diaphoresis, or any other associated symptoms.   Cardiologist: none PCP: Dr Sandi Mariscal  Past Medical History:  Diagnosis Date  . ADD (attention deficit disorder)   . Anxiety   . Chronic lower back pain   . Club foot   . Coronary artery disease   . Endocarditis    At [facility] 2016  . GERD (gastroesophageal reflux disease)   . Hyperlipidemia   . Hypertension   . Myocardial infarction [facility]) 2000's - ~ 04/2015   "I've had a total of 3" (11/18/2015)  . PTSD (post-traumatic stress disorder)   . 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 Stroke [facility]) ~ 2006   denies residual on 11/18/2015   Patient Active Problem List   Diagnosis Date Noted  . CAD S/P PCI 11/19/2015  . Anxiety state   . Chest pain 11/18/2015  . HLD (hyperlipidemia) 11/18/2015  . Essential hypertension 11/18/2015  . Tobacco abuse 11/18/2015   Past Surgical History:  Procedure Laterality Date  . ABOVE KNEE LEG AMPUTATION Left   . CARDIAC CATHETERIZATION  "several"  . CARDIAC CATHETERIZATION N/A 11/26/2015   Procedure: Left Heart Cath and Coronary Angiography;  Surgeon: Burnell Blanks, MD;  Location: [facility];  Service: Cardiovascular;  Laterality: N/A;  . CLUB FOOT RELEASE Left Aug 11, 1967  . CORONARY ANGIOPLASTY  "several"  . CORONARY ANGIOPLASTY WITH STENT PLACEMENT  "several"   "total of 10 stents placed" (11/18/2015)  . LAPAROSCOPIC CHOLECYSTECTOMY    . ORTHOPEDIC SURGERY  11-29-66-~ 2007   "total of 52 on my left leg; started w/club foot released"  . TONSILLECTOMY      Home Medications    Prior to Admission medications   Medication Sig Start Date End Date Taking? Authorizing Provider  ALPRAZolam Duanne Moron) 1 MG tablet Take 1 mg by mouth 3 (three) times daily.    Historical Provider, MD  metoprolol succinate (TOPROL-XL) 25 MG 24 hr tablet Take 25 mg by mouth daily.    Historical Provider, MD  oxycodone (ROXICODONE)  30 MG immediate release tablet Take 30 mg by mouth 3 (three) times daily.    Historical Provider, MD   Family History Family History  Problem Relation Age of Onset  . Hypertension Father   . Lung cancer Father    Social History Social History  Substance Use Topics  . Smoking status: Current Every Day Smoker    Packs/day: 1.50    Years: 35.00    Types: Cigarettes  . Smokeless tobacco: Never Used  . Alcohol use No   Allergies   Adipex-p [phentermine]; Benadryl [diphenhydramine hcl]; Promethazine hcl; Augmentin [amoxicillin-pot clavulanate]; and Toradol [ketorolac  tromethamine]  Review of Systems Review of Systems  Constitutional: Negative for diaphoresis.  Respiratory: Positive for shortness of breath.   Cardiovascular: Positive for chest pain.  Gastrointestinal: Negative for nausea.  All other systems reviewed and are negative.  Physical Exam Updated Vital Signs BP 115/75   Pulse 80   Temp 98.3 F (36.8 C) (Oral)   Resp 16   Ht 6' (1.829 m)   Wt 177 lb 2 oz (80.3 kg)   SpO2 98%   BMI 24.02 kg/m   Physical Exam  Constitutional: He is oriented to person, place, and time. He appears well-developed and well-nourished.  HENT:  Head: Normocephalic and atraumatic.  Eyes: EOM are normal. Pupils are equal, round, and reactive to light.  Neck: Normal range of motion. Neck supple. No JVD present.  Cardiovascular: Normal rate, regular rhythm and normal heart sounds.   No murmur heard. Pulmonary/Chest: Effort normal and breath sounds normal. He has no wheezes. He has no rales. He exhibits no tenderness.  Abdominal: Soft. Bowel sounds are normal. He exhibits no distension and no mass. There is no tenderness.  Musculoskeletal: Normal range of motion. He exhibits no edema.  Lymphadenopathy:    He has no cervical adenopathy.  Neurological: He is alert and oriented to person, place, and time. No cranial nerve deficit. He exhibits normal muscle tone. Coordination normal.  Skin: Skin is warm and dry. No rash noted.  Psychiatric: He has a normal mood and affect. His behavior is normal. Judgment and thought content normal.  Nursing note and vitals reviewed.  ED Treatments / Results  DIAGNOSTIC STUDIES: Oxygen Saturation is 98% on RA, normal by my interpretation.   COORDINATION OF CARE: 12:58 AM-Discussed next steps with pt. Pt verbalized understanding and is agreeable with the plan.   Labs (all labs ordered are listed, but only abnormal results are displayed) Labs Reviewed  BASIC METABOLIC PANEL - Abnormal; Notable for the following:        Result Value   Glucose, Bld 101 (*)    All other components within normal limits  CBC  I-STAT TROPOININ, ED  I-STAT TROPOININ, ED  Randolm Idol, ED   EKG  EKG Interpretation  Date/Time:  Wednesday July 21 2016 19:06:32 EDT Ventricular Rate:  101 PR Interval:  140 QRS Duration: 84 QT Interval:  330 QTC Calculation: 427 R Axis:   61 Text Interpretation:  Sinus tachycardia Otherwise normal ECG When compared with ECG of 06/13/2016, No significant change was found Confirmed by [facility]  MD, Demarquis Osley ([id number]) on 07/22/2016 12:42:39 AM      Radiology Dg Chest 2 View  Result Date: 07/21/2016 CLINICAL DATA:  Chest pain and shortness of breath for 2 days. Atrial fibrillation. Coronary artery disease. Previous myocardial infarction. EXAM: CHEST  2 VIEW COMPARISON:  06/13/2016 FINDINGS: The heart size and mediastinal contours are within normal limits. Coronary artery stents noted.  Mild scarring noted within the lingula. No evidence of pulmonary infiltrate or edema. Pulmonary hyperinflation noted. No evidence of pneumothorax or pleural effusion. IMPRESSION: Pulmonary hyperinflation and mild lingular scarring. No active cardiopulmonary disease. Electronically Signed   By: Earle Gell M.D.   On: 07/21/2016 19:42   Procedures Procedures (including critical care time)  Medications Ordered in ED Medications  morphine 4 MG/ML injection 4 mg (not administered)  morphine 4 MG/ML injection 4 mg (4 mg Intravenous Given 07/22/16 0115)  aspirin chewable tablet 324 mg (324 mg Oral Given 07/22/16 0115)    Initial Impression / Assessment and Plan / ED Course  I have reviewed the triage vital signs and the nursing notes.  Pertinent labs & imaging results that were available during my care of the patient were reviewed by me and considered in my medical decision making (see chart for details).  Clinical Course   Chest pain of uncertain cause. Patient does have history of coronary artery disease. Review of  old records shows chronic catheterization in January which showed stents in all 3 major vessels and a 50-70% lesion of the first obtuse marginal off of the circumflex. He failed nitroglycerin at home with development of headache indicating that the range of motion was adequately potent. He is given aspirin and morphine here but still is having ongoing pain. ECG shows no acute changes and initial troponin is negative. Given his history, I do feel he needs to be admitted for serial troponins. Of note, he had been admitted one month ago and signed out [place]. Have discussed this with him and he is willing to stay. Case is discussed with Dr. Loleta Books of [facility] who agrees to admit the patient. He'll be admitted under observation status.  Final Clinical Impressions(s) / ED Diagnoses   Final diagnoses:  Chest pain, unspecified chest pain type   New Prescriptions New Prescriptions   No medications on file   I personally performed the services described in this documentation, which was scribed in my presence. The recorded information has been reviewed and is accurate.      Delora Fuel, MD [id number] [phone]

## 2016-07-22 NOTE — Progress Notes (Signed)
Progress note  S: See today's H&P for details.   On my examination this morning, patient complains of sharp left chest pain that radiates to his back. He states that he gets this chest pain about 2 times a week for "a while." He was apparently on Ranexa in the past but has since stopped taking this. He took 2 nitro SL at home without CP relief. He states that when he hunches his shoulders forward, his CP improves. CP is reproducible with palpation of his anterior rib. He has been admitted in the past for CP as well. Cardiology was consulted by admitting team. He takes opioids outpatient for his back pain but states that it does not really help his chest pain nor his back pain.   O: BP 117/76 (BP Location: Right Arm)   Pulse 90   Temp 97.9 F (36.6 C) (Oral)   Resp 15   Ht 6' (1.829 m)   Wt 80.3 kg (177 lb 2 oz)   SpO2 97%   BMI 24.02 kg/m   Generalappearance:Well-developed, adult male, alert and in no acutedistress. Eyes:Anicteric, conjunctiva pink, lids and lashes normal. ENT:No nasal deformity, discharge, or epistaxis. Skin:Warm and dry. No rashes or lesions. Cardiac:RRR, nl S1-S2, no murmursappreciated.Chest pain on left is reproducible with palpation Respiratory:Normal respiratory rate and rhythm. CTAB [id number] soft without rigidity.NoTTP. MSK:No deformities or effusions.  Neuro:Speech is fluent. Moves all extremities spontaneously.  Psych:Behavior appropriate. Affect normal.   EKG was completed on admission which was unchanged from previous. Troponin has been negative.   A/P: Chest pain -Serial troponins negative  -Telemetry -Aspirin 325 given, continue 81 mg daily -NPO for now  -Consult to cardiology, appreciate recommendations  HLD -Continue Crestor 5 mg daily  HTN -Restart Bystolic after stress test  Anxiety -Alprazolam PRN  Chronic back pain -Continue hydrocodone-acetaminophen 10-325 four times daily PRN, as prescribed  by PCP   Tobacco abuse -Cessation counseling -Nicoderm patch   Dessa Phi, DO [facility] Pager [phone]  If 7PM-7AM, please contact night-coverage [email or web address] Password [facility] 07/22/2016, 8:37 AM

## 2016-07-22 NOTE — Discharge Summary (Addendum)
Physician Discharge Summary  Aaron Dodson [id number] DOB: Mar 17, 1966 DOA: 07/22/2016  PCP: Sandi Mariscal, MD  Admit date: 07/22/2016 Discharge date: 07/22/2016  Admitted From:  Home  Disposition:  Home   Recommendations for Outpatient Follow-up:  1. Follow up with PCP in 1-2 weeks 2. Follow up with Cardiology Dr Marlou Porch (cardiology)   Home Health: No  Equipment/Devices: No  Discharge Condition: Stable CODE STATUS: Full Diet recommendation: Heart Healthy   Brief/Interim Summary: Aaron Dodson is a 50 y.o. male with a past medical history significant for CAD s/p PCI numerous times, HTN, previous IVDU and endocarditis in 2016 and anxiety who presents with chest pain. The patient was admitted overnight a month ago shortly after getting out of jail for cardiac rule out.  He had three negative troponins but then left AMA.  At the time, he had been off his Plavix for 6 months while in jail (Jan to July 2017), despite getting a DES in June 2016.   He has had numerous stents, most recently in June 2016 (DES).  These are challenging to sort through in [facility], but he appears to have had >10 LHCs, with following stents: LCx in 2013 [facility]1 and LCx in 2014 [facility] RCA x2 in 2015 [facility] in 2016 [facility]  Patient complained of left sided chest pain which was reproducible to palpation of his anterior rib . Cardiology was consulted who evaluated patient. They did feel that his symptoms were consistent with musculoskeletal etiology. Workup including troponin and EKG were normal and negative. Patient was discharged in stable condition without further need for cardiac workup. We discussed importance of smoking cessation. He should continue his home medications including aspirin, plavix, statin, bystolic. He should follow-up with his cardiologist upon discharge.  Discharge Diagnoses:  Principal Problem:   Pain in the chest Active Problems:   HLD (hyperlipidemia)   Essential hypertension   Tobacco  abuse   CAD s/p PCI   Anxiety state  Discharge Instructions  Discharge Instructions    Call MD for:    Complete by:  As directed    Chest pain, shortness of breath   Diet - low sodium heart healthy    Complete by:  As directed    Increase activity slowly    Complete by:  As directed        Medication List    TAKE these medications   ALPRAZolam 1 MG tablet Commonly known as:  XANAX Take 1 mg by mouth 3 (three) times daily.   aspirin 81 MG EC tablet Take 1 tablet (81 mg total) by mouth daily. Start taking on:  07/23/2016   clopidogrel 75 MG tablet Commonly known as:  PLAVIX Take 1 tablet (75 mg total) by mouth daily.   HYDROcodone-acetaminophen 10-325 MG tablet Commonly known as:  NORCO Take 1 tablet by mouth 4 (four) times daily.   ibuprofen 800 MG tablet Commonly known as:  MOTRIN IB Take 1 tablet (800 mg total) by mouth every 6 (six) hours as needed for mild pain or moderate pain.   morphine 15 MG tablet Commonly known as:  MSIR Take 15 mg by mouth 2 (two) times daily.   nebivolol 10 MG tablet Commonly known as:  BYSTOLIC Take 5 mg by mouth daily.   rosuvastatin 5 MG tablet Commonly known as:  CRESTOR Take 5 mg by mouth daily.      Follow-up Information    Sandi Mariscal, MD. Schedule an appointment as soon as possible for a visit in  1 week(s).   Specialty:  Internal Medicine Contact information: [place] [address] [phone]        Candee Furbish, MD. Schedule an appointment as soon as possible for a visit in 1 week(s).   Specialty:  Cardiology Contact information: [id number] [place] [address] [phone]          Allergies  Allergen Reactions  . Adipex-P [Phentermine]     Aggressive behavior  . Benadryl [Diphenhydramine Hcl]     Aggressive behavior  . Promethazine Hcl     Unknown - given for surgical procedure and advised to never take again  . Augmentin [Amoxicillin-Pot Clavulanate]  Diarrhea and Nausea Only  . Toradol [Ketorolac Tromethamine] Hives    Redness    Consultations:  Cardiology, Dr. Quay Burow   Procedures/Studies: Dg Chest 2 View  Result Date: 07/21/2016 CLINICAL DATA:  Chest pain and shortness of breath for 2 days. Atrial fibrillation. Coronary artery disease. Previous myocardial infarction. EXAM: CHEST  2 VIEW COMPARISON:  06/13/2016 FINDINGS: The heart size and mediastinal contours are within normal limits. Coronary artery stents noted. Mild scarring noted within the lingula. No evidence of pulmonary infiltrate or edema. Pulmonary hyperinflation noted. No evidence of pneumothorax or pleural effusion. IMPRESSION: Pulmonary hyperinflation and mild lingular scarring. No active cardiopulmonary disease. Electronically Signed   By: Earle Gell M.D.   On: 07/21/2016 19:42     Subjective: On my examination this morning, patient complains of sharp left chest pain that radiates to his back. He states that he gets this chest pain about 2 times a week for "a while." He was apparently on Ranexa in the past but has since stopped taking this. He took 2 nitro SL at home without CP relief. He states that when he hunches his shoulders forward, his CP improves. CP is reproducible with palpation of his anterior rib. He has been admitted in the past for CP as well. Cardiology was consulted by admitting team. He takes opioids outpatient for his back pain but states that it does not really help his chest pain nor his back pain.   Discharge Exam: Vitals:   07/22/16 1137 07/22/16 1528  BP: 107/75 120/79  Pulse: 79 88  Resp: 14 19  Temp: 98.1 F (36.7 C) 97.3 F (36.3 C)   Vitals:   07/22/16 0318 07/22/16 0746 07/22/16 1137 07/22/16 1528  BP:  117/76 107/75 120/79  Pulse:  90 79 88  Resp:  15 14 19   Temp: 97.6 F (36.4 C) 97.9 F (36.6 C) 98.1 F (36.7 C) 97.3 F (36.3 C)  TempSrc: Oral Oral Oral Oral  SpO2: 98% 97% 100% 96%  Weight:      Height:        Generalappearance:Well-developed, adult male, alert and in no acutedistress. Eyes:Anicteric, conjunctiva pink, lids and lashes normal. ENT:No nasal deformity, discharge, or epistaxis. Skin:Warm and dry. No rashes or lesions. Cardiac:RRR, nl S1-S2, no murmursappreciated.Chest pain on left is reproducible with palpation Respiratory:Normal respiratory rate and rhythm. CTAB [id number] soft without rigidity.NoTTP. MSK:No deformities or effusions.  Neuro:Speech is fluent. Moves all extremities spontaneously.  Psych:Behavior appropriate. Affect normal.    The results of significant diagnostics from this hospitalization (including imaging, microbiology, ancillary and laboratory) are listed below for reference.     Microbiology: Recent Results (from the past 240 hour(s))  MRSA PCR Screening     Status: Abnormal   Collection Time: 07/22/16  6:09 AM  Result Value Ref Range Status  MRSA by PCR POSITIVE (A) NEGATIVE Final    Comment:        The GeneXpert MRSA Assay (FDA approved for NASAL specimens only), is one component of a comprehensive MRSA colonization surveillance program. It is not intended to diagnose MRSA infection nor to guide or monitor treatment for MRSA infections. RESULT CALLED TO, READ BACK BY AND VERIFIED WITH: B GROGAN,RN AT 1008 07/22/16 BY K BARR      Labs: BNP (last 3 results)  Recent Labs  11/21/15 1854  BNP 0000000   Basic Metabolic Panel:  Recent Labs Lab 07/21/16 1915  NA 138  K 3.8  CL 103  CO2 27  GLUCOSE 101*  BUN 9  CREATININE 1.12  CALCIUM 10.0   Liver Function Tests: No results for input(s): AST, ALT, ALKPHOS, BILITOT, PROT, ALBUMIN in the last 168 hours. No results for input(s): LIPASE, AMYLASE in the last 168 hours. No results for input(s): AMMONIA in the last 168 hours. CBC:  Recent Labs Lab 07/21/16 1915  WBC 9.4  HGB 14.7  HCT 42.9  MCV 94.3  PLT 317   Cardiac Enzymes:  Recent Labs Lab  07/22/16 0533  TROPONINI <0.03   BNP: Invalid input(s): POCBNP CBG: No results for input(s): GLUCAP in the last 168 hours. D-Dimer No results for input(s): DDIMER in the last 72 hours. Hgb A1c No results for input(s): HGBA1C in the last 72 hours. Lipid Profile No results for input(s): CHOL, HDL, LDLCALC, TRIG, CHOLHDL, LDLDIRECT in the last 72 hours. Thyroid function studies No results for input(s): TSH, T4TOTAL, T3FREE, THYROIDAB in the last 72 hours.  Invalid input(s): FREET3 Anemia work up No results for input(s): VITAMINB12, FOLATE, FERRITIN, TIBC, IRON, RETICCTPCT in the last 72 hours. Urinalysis No results found for: COLORURINE, APPEARANCEUR, [place], [place], [place], [place], [place], [place], PROTEINUR, UROBILINOGEN, NITRITE, LEUKOCYTESUR Sepsis Labs Invalid input(s): PROCALCITONIN,  WBC,  LACTICIDVEN Microbiology Recent Results (from the past 240 hour(s))  MRSA PCR Screening     Status: Abnormal   Collection Time: 07/22/16  6:09 AM  Result Value Ref Range Status   MRSA by PCR POSITIVE (A) NEGATIVE Final    Comment:        The GeneXpert MRSA Assay (FDA approved for NASAL specimens only), is one component of a comprehensive MRSA colonization surveillance program. It is not intended to diagnose MRSA infection nor to guide or monitor treatment for MRSA infections. RESULT CALLED TO, READ BACK BY AND VERIFIED WITH: B GROGAN,RN AT 1008 07/22/16 BY K BARR     Time coordinating discharge: Over 30 minutes  SIGNED:  Dessa Phi, DO [facility] Pager [phone]  If 7PM-7AM, please contact night-coverage [email or web address] Password [facility] 07/22/2016, 3:45 PM

## 2016-07-22 NOTE — Progress Notes (Signed)
Discharge instructions given to patient, all questions answered at this time.  Pt. VSS with no s/s of distress noted.  Pt. Stable at discharge.

## 2016-07-22 NOTE — H&P (Signed)
History and Physical  Patient Name: Aaron Dodson     [id number]    DOB: 06-29-1966    DOA: 07/22/2016 PCP: Sandi Mariscal, MD   Patient coming from: Home     Chief Complaint: Chest pain  HPI: Aaron Dodson is a 50 y.o. male with a past medical history significant for CAD s/p PCI numerous times, HTN, previous IVDU and endocarditis in 2016 and anxiety who presents with chest pain.  The patient was admitted overnight a month ago shortly after getting out of jail for cardiac rule out.  He had three negative troponins but then left AMA.  At the time, he had been off his Plavix for 6 months while in jail (Jan to July 2017), despite getting a DES in June 2016.  Since then, he has found a new PCP at [facility].  He is back on daily opioids, he says.  He has had no problems until this weekend, when he says he had "a few episodes of atrial fibrillation again", all of which resolved on their own, and for which he sought no care.  Then today, he had new intermittent left sided chest pain, pressure like in character, radiating to the left shoulder, maybe similar to his previous angina, maybe not.  This was not particularly associated with exertion, rest, food, or position.  It was worse during his morning meeting with his parole officer, but persisted all afternoon until this evening he was doing dishes, complained about it to his wife, who made him go to the ER.  ED course: -Afebrile, heart rate and respirations normal, BP mildly hypertensive, SpO2 normal -Initial ECG showed normal sinus rhythm and troponin was negative twice at 0 and 6 hours. -Na 138, K 3.8, Cr 1.1 (baseline), WBC 9.4, Hgb 14.7 -CXR negative -TRH was asked to admit for observation, serial troponins and risk stratification.   He has had numerous stents, most recently in June 2016 (DES).  These are challenging to sort through in [facility], but he appears to have had >10 LHCs, with following stents: LCx in 2013 [place] OM1 and  LCx in 2014 [facility] RCA x2 in 2015 [place] LAD in 2016 [facility]  There is report of him having atrial fibrillation during a hospitalization for chest pain at [facility] in 2016, but this was not present there, he is not on an anticoagulant (CHADS2Vasc 2).  Per his report it happened in [place].        Review of Systems:  Pt complains of chest pain. Pt denies any shortness of breath, leg swelling, fever, chills, night sweats.  All other systems negative except as just noted or noted in the history of present illness.  Past Medical History:  Diagnosis Date  . ADD (attention deficit disorder)   . Anxiety   . Chronic lower back pain   . Club foot   . Coronary artery disease   . Endocarditis    At [facility] 2016  . GERD (gastroesophageal reflux disease)   . Hyperlipidemia   . Hypertension   . Myocardial infarction [facility]) 2000's - ~ 04/2015   "I've had a total of 3" (11/18/2015)  . PTSD (post-traumatic stress disorder)   . 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 Stroke [facility]) ~ 2006   denies residual on 11/18/2015    Past Surgical History:  Procedure Laterality Date  . ABOVE KNEE LEG AMPUTATION Left   . CARDIAC CATHETERIZATION  "several"  . CARDIAC CATHETERIZATION  N/A 11/26/2015   Procedure: Left Heart Cath and Coronary Angiography;  Surgeon: Burnell Blanks, MD;  Location: [place] [facility];  Service: Cardiovascular;  Laterality: N/A;  . CLUB FOOT RELEASE Left 1967/05/08  . CORONARY ANGIOPLASTY  "several"  . CORONARY ANGIOPLASTY WITH STENT PLACEMENT  "several"   "total of 10 stents placed" (11/18/2015)  . LAPAROSCOPIC CHOLECYSTECTOMY    . ORTHOPEDIC SURGERY  1967-07-25-~ 2007   "total of 52 on my left leg; started w/club foot released"  . TONSILLECTOMY      Social History: Patient lives with his wife.  Patient walks unassisted. Recently out of jail.  Smoking daily.  Not currently using drugs.    Allergies  Allergen Reactions  . Adipex-P [Phentermine]       Aggressive behavior  . Benadryl [Diphenhydramine Hcl]     Aggressive behavior  . Promethazine Hcl     Unknown - given for surgical procedure and advised to never take again  . Augmentin [Amoxicillin-Pot Clavulanate] Diarrhea and Nausea Only  . Toradol [Ketorolac Tromethamine] Hives    Redness    Family history: family history includes Hypertension in his father; Lung cancer in his father.  Prior to Admission medications   Medication Sig Start Date End Date Taking? Authorizing Provider  ALPRAZolam Duanne Moron) 1 MG tablet Take 1 mg by mouth 3 (three) times daily.    Historical Provider, MD  metoprolol succinate (TOPROL-XL) 25 MG 24 hr tablet Take 25 mg by mouth daily.    Historical Provider, MD  oxycodone (ROXICODONE) 30 MG immediate release tablet Take 30 mg by mouth 3 (three) times daily.    Historical Provider, MD       Physical Exam: BP 144/93   Pulse 71   Temp 98.3 F (36.8 C) (Oral)   Resp 15   Ht 6' (1.829 m)   Wt 80.3 kg (177 lb 2 oz)   SpO2 98%   BMI 24.02 kg/m  General appearance: Well-developed, adult male, alert and in no acute distress.  Eyes: Anicteric, conjunctiva pink, lids and lashes normal.    ENT: No nasal deformity, discharge, or epistaxis.  OP moist without lesions. hearing normal. Skin: Warm and dry.   No suspicious rashes or lesions. Cardiac: RRR, nl S1-S2, no murmurs appreciated. Capillary refill is brisk.  JVP normal.  No LE edema.  Radial pulses 2+ and symmetric.  RIGHT DP pulse normal. Respiratory: Normal respiratory rate and rhythm.  CTAB without rales or wheezes. GI: Abdomen soft without rigidity.  No TTP.No ascites, distension.   MSK: No deformities or effusions.   Pain not reproduced with palpation of precordium.  No pain with arm movement.  Left leg amputated above knee. Neuro: Sensorium intact and responding to questions, attention normal. Speech is fluent.  Moves all extremities equally and with normal coordination.    Psych: Behavior  appropriate.  Affect normal.  No evidence of aural or visual hallucinations or delusions.       Labs on Admission:  The metabolic panel shows normal electrolytes and renal function. The complete blood count shows no anemia, leukocytosis or thrombocytopenia. The initial troponin is negative.     Radiological Exams on Admission: Personally reviewed: Dg Chest 2 View  Result Date: 07/21/2016 CLINICAL DATA:  Chest pain and shortness of breath for 2 days. Atrial fibrillation. Coronary artery disease. Previous myocardial infarction. EXAM: CHEST  2 VIEW COMPARISON:  06/13/2016 FINDINGS: The heart size and mediastinal contours are within normal limits. Coronary artery stents noted. Mild scarring noted  within the lingula. No evidence of pulmonary infiltrate or edema. Pulmonary hyperinflation noted. No evidence of pneumothorax or pleural effusion. IMPRESSION: Pulmonary hyperinflation and mild lingular scarring. No active cardiopulmonary disease. Electronically Signed   By: Earle Gell M.D.   On: 07/21/2016 19:42    EKG: Independently reviewed. Rate 101, QTc 427.  No ischemic changes.    Assessment/Plan 1. Chest pain: This is new.  The patient has HEART score of 4. The pain is atypical.  Negative PERC rule for PE.  Other potential causes of chest pain (dissection, pancreatitis, pneumonia/effusion, pericarditis) are doubted.  We have been asked to admit the patient for observation and etiology consultation with Cardiology tomorrow. Is now past 12 months from DES. -Serial troponins are ordered -Telemetry -Consult to cardiology, appreciate recommendations -Smoking cessation was recommended -Continue Crestor 5 mg daily -Aspirin 325 given, continue 81 mg daily    2. HTN:  -Restart Bystolic after stress test  3. Anxiety:  -Discussed SSRI therapy, strongly encouraged -Alprazolam PRN  4. Chronic pain: -Continue hydrocodone-acetaminophen 10-325 four times daily PRN, as prescribed by PCP [place]  and verified personally in the [place] Controlled substances registry      DVT prophylaxis: Lovenox Diet: NPO after 4am for anticipated stress testing Code Status: Full  Family Communication: None present  Disposition Plan: Anticipate overnight observation for arrhythmia on telemetry, serial troponins and subsequent risk stratification by Cardiology.  If testing negative, home after. Consults called: Cardiology Admission status: Stepdown, OBS   Medical decision making: Patient seen at 2:13 AM on 07/22/2016.  The patient was discussed with Dr. Roxanne Mins. What exists of the patient's chart and outside records in [facility] were reviewed in depth and summarized above.  Clinical condition: stable.      Edwin Dada [facility] Pager [phone]

## 2016-07-22 NOTE — Consult Note (Signed)
Patient ID: Aaron Dodson MRN: [id number], DOB/AGE: September 13, 1966   Admit date: 07/22/2016   Reason for Consult: Chest Pain  Requesting MD: Dr. Shon Millet, DO, Internal Medicine    Primary Physician: Sandi Mariscal, MD Primary Cardiologist: Marlou Porch (seen in consultation 11/2015)  Pt. Profile:  50 y/o male with a h/o triple vessel CAD s/p remote coronary stenting in [place] in the past (LAD, OM1 and RCA stents). He had a recent cath here at [facility] in 11/2015 after multiple presentations to the ED for chest pain, revealing nonobstructive CAD. Continued medical therapy recommended. Since then, he was incarcerated for several months and has returned 2 separate occasions with recurrent CP (06/2016- left AMA and + current admission). Cardiology consulted for CP evaluation. Also with a h/o HTN, HLD, prior IVDU and endocarditis in 2016, chronic LBP for which he takes opioids + h/o anxiety.    Problem List  Past Medical History:  Diagnosis Date  . ADD (attention deficit disorder)   . Anxiety   . Chronic lower back pain   . Club foot   . Coronary artery disease   . Endocarditis    At [facility] 2016  . GERD (gastroesophageal reflux disease)   . Hyperlipidemia   . Hypertension   . Myocardial infarction [facility]) 2000's - ~ 04/2015   "I've had a total of 3" (11/18/2015)  . PTSD (post-traumatic stress disorder)   . 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 Stroke [facility]) ~ 2006   denies residual on 11/18/2015    Past Surgical History:  Procedure Laterality Date  . ABOVE KNEE LEG AMPUTATION Left   . CARDIAC CATHETERIZATION  "several"  . CARDIAC CATHETERIZATION N/A 11/26/2015   Procedure: Left Heart Cath and Coronary Angiography;  Surgeon: Burnell Blanks, MD;  Location: [place] [facility];  Service: Cardiovascular;  Laterality: N/A;  . CLUB FOOT RELEASE Left 11/05/1967  . CORONARY ANGIOPLASTY  "several"  . CORONARY ANGIOPLASTY WITH STENT PLACEMENT  "several"     "total of 10 stents placed" (11/18/2015)  . LAPAROSCOPIC CHOLECYSTECTOMY    . ORTHOPEDIC SURGERY  1967-10-24-~ 2007   "total of 52 on my left leg; started w/club foot released"  . TONSILLECTOMY       Allergies  Allergies  Allergen Reactions  . Adipex-P [Phentermine]     Aggressive behavior  . Benadryl [Diphenhydramine Hcl]     Aggressive behavior  . Promethazine Hcl     Unknown - given for surgical procedure and advised to never take again  . Augmentin [Amoxicillin-Pot Clavulanate] Diarrhea and Nausea Only  . Toradol [Ketorolac Tromethamine] Hives    Redness    HPI  50 y/o male with a h/o triple vessel CAD s/p remote coronary stenting in [place] in the past (LAD, OM1 and RCA stents). He had a recent cath here at [facility] in 11/2015 and multiple presentations to the ED for chest pain. LHC showed patent stents with 40% ostial narrowing within the OM1 stent. The continuation of the AV groove Circumflex beyond OM1 was jailed by the stent with 50-70% stenosis. The LAD had mild plaque in the proximal segment but patient stent in the distal/apical segment w/o stenosis and the RCA also had patent stents extending from the ostium of the vessel down into the PDA. There is diffuse mild stent restenosis. The left main had a distal 30% stenosis. LVF was normal. EF estimated at 55-65%. Continued medical therapy was recommended.  Following his last admission in Jan, he was incarcerated for several months. After release, he presented back to the ED in August with complaint of recurrent CP. He was admitted by IM. Troponins were negative and he ultimately left AMA before any further w/u could be completed.   In addition to CAD, he also has a PMH significant for HTN, HLD, CVA, IVDU, endocarditis treated at [facility] in 2016, left AKA, chronic LBP and anxiety. Per H&P, "He takes opioids as an outpatient for his back pain".   He presented back to [facility] last night with complaint of recurrent CP. He  has been admitted by IM. EKG shows sinus tach at a rate of 101 bpm but otherwise normal w/o ischemic abnormalities. ED POC troponin negative x 2. Actual lab troponin negative x 1. CXR normal. CBC and BMP both unremarkable.   The patient describes atypical symptoms. Dull left sided chest discomfort that is reproducible with palpation and improves if he "hunches his shoulders". No pleuritic. Also with sharp back pain. Sometimes worse with exertion, but not always. No exertional dyspnea. No relief with SL NTG. He admits that he has been out of his home meds including Crestor and Bystolic. He notes both were discontinued when he went to prison. He lost his insurance for a couple of months but it has resumed. He also has been off of plavix. He was told at his previous hospitalization in 06/2016 that he could stop as his stent placement was > 50yr. He continues to smoke ~1 ppd.     Home Medications  Prior to Admission medications   Medication Sig Start Date End Date Taking? Authorizing Provider  ALPRAZolam Duanne Moron) 1 MG tablet Take 1 mg by mouth 3 (three) times daily.   Yes Historical Provider, MD  HYDROcodone-acetaminophen (NORCO) 10-325 MG tablet Take 1 tablet by mouth 4 (four) times daily.   Yes Historical Provider, MD  morphine (MSIR) 15 MG tablet Take 15 mg by mouth 2 (two) times daily.   Yes Historical Provider, MD  nebivolol (BYSTOLIC) 10 MG tablet Take 5 mg by mouth daily.    Yes Historical Provider, MD  rosuvastatin (CRESTOR) 5 MG tablet Take 5 mg by mouth daily.   Yes Historical Provider, MD    Hospital Meds  . aspirin EC  81 mg Oral Daily  . enoxaparin (LOVENOX) injection  40 mg Subcutaneous Q24H  . HYDROcodone-acetaminophen  1 tablet Oral QID  . nicotine  14 mg Transdermal Daily  . rosuvastatin  5 mg Oral Daily   Family History  Family History  Problem Relation Age of Onset  . Hypertension Father   . Lung cancer Father     Social History  Social History   Social History  .  Marital status: Divorced    Spouse name: N/A  . Number of children: N/A  . Years of education: N/A   Occupational History  . Not on file.   Social History Main Topics  . Smoking status: Current Every Day Smoker    Packs/day: 1.50    Years: 35.00    Types: Cigarettes  . Smokeless tobacco: Never Used  . Alcohol use No  . Drug use:      Comment: 8/7, sober/clear >1 year since endocarditis  . Sexual activity: Not Currently   Other Topics Concern  . Not on file   Social History Narrative   Lives with ex wife by his report.  On disability.       Review of Systems General:  No chills, fever, night sweats or weight changes.  Cardiovascular:  No chest pain, dyspnea on exertion, edema, orthopnea, palpitations, paroxysmal nocturnal dyspnea. Dermatological: No rash, lesions/masses Respiratory: No cough, dyspnea Urologic: No hematuria, dysuria Abdominal:   No nausea, vomiting, diarrhea, bright red blood per rectum, melena, or hematemesis Neurologic:  No visual changes, wkns, changes in mental status. All other systems reviewed and are otherwise negative except as noted above.  Physical Exam  Blood pressure 117/76, pulse 90, temperature 97.9 F (36.6 C), temperature source Oral, resp. rate 15, height 6' (1.829 m), weight 177 lb 2 oz (80.3 kg), SpO2 97 %.  General: Pleasant, NAD, slightly anxious  Psych: Normal affect. Neuro: Alert and oriented X 3. Moves all extremities spontaneously. HEENT: Normal  Neck: Supple without bruits or JVD. Lungs:  Resp regular and unlabored, CTA. Heart: RRR no s3, s4, or murmurs. Abdomen: Soft, non-tender, non-distended, BS + x 4.  Extremities: No clubbing, cyanosis or edema. DP/PT/Radials 2+ and equal bilaterally. S/p left AKA.   Labs  Troponin [facility] Test)  Recent Labs  07/22/16 0057  TROPIPOC 0.00    Recent Labs  07/22/16 0533  TROPONINI <0.03   Lab Results  Component Value Date   WBC 9.4 07/21/2016   HGB 14.7 07/21/2016    HCT 42.9 07/21/2016   MCV 94.3 07/21/2016   PLT 317 07/21/2016    Recent Labs Lab 07/21/16 1915  NA 138  K 3.8  CL 103  CO2 27  BUN 9  CREATININE 1.12  CALCIUM 10.0  GLUCOSE 101*   No results found for: CHOL, HDL, LDLCALC, TRIG Lab Results  Component Value Date   DDIMER 1.02 (H) 11/21/2015     Radiology/Studies  Dg Chest 2 View  Result Date: 07/21/2016 CLINICAL DATA:  Chest pain and shortness of breath for 2 days. Atrial fibrillation. Coronary artery disease. Previous myocardial infarction. EXAM: CHEST  2 VIEW COMPARISON:  06/13/2016 FINDINGS: The heart size and mediastinal contours are within normal limits. Coronary artery stents noted. Mild scarring noted within the lingula. No evidence of pulmonary infiltrate or edema. Pulmonary hyperinflation noted. No evidence of pneumothorax or pleural effusion. IMPRESSION: Pulmonary hyperinflation and mild lingular scarring. No active cardiopulmonary disease. Electronically Signed   By: Earle Dodson M.D.   On: 07/21/2016 19:42    ECG  Sinus tach 101 bpm. No ischemia.   ASSESSMENT AND PLAN  Principal Problem:   Pain in the chest Active Problems:   HLD (hyperlipidemia)   Essential hypertension   Tobacco abuse   CAD s/p PCI   Anxiety state  1. Atypical Chest Pain: symptoms consistent with musculoskeletal etiology -reproducible with palpation, improved with certain positional changes. W/u including negative troponin's and normal EKG argue against cardiac etiology. He had a LHC 11/2015 that showed patent stents, nonobstructive CAD and normal LVEF. Can try giving a dose of IV Toradol for chest wall pain vs other PO NSAIDs PRN.  Avoid opioid Rx given history. ? Drug seeking behavior. H/o polysubstance abuse and multiple ED presentations/ admissions for pain.   2. CAD: patient does have h/o CAD with prior stenting of his LAD, OM1 and RCA. Recent LHC 11/2015 showed patent stents and nonobstructive CAD. Recent CP, negative enzymes and normal  EKG not c/w cardiac etiology. No indication for additional ischemic w/u at this time. Continue medical management of CAD and risk factor modification for secondary prevention. We discussed importance of smoking cessation. He should continue ASA and may consider restarting Plavix. He should also  resume statin therapy and perhaps resumption of his BB, if BP allows.   3. HLD: restart statin for secondary prevention of CAD.   4. HTN: BP is currently well controlled.  5. Tobacco Use: smoking cessation advised.   6. S/p left AKA: patient notes he was born with a club foot, had nerve issues and ultimately required amputation. He has a prosthesis.    Signed, Lyda Jester, PA-C 07/22/2016, 11:37 AM   Agree with note written by Ellen Henri  PAC  Pt with H/O CAD s/p mult prior stents, last cath by Dr Jearld Lesch 11/26/15 in setting of CP showed widely patent stents without obstructive CAD. H/O HTN, HLD, Tob abuse, IVDA with SBE s/p LAKA. Admitted now with atyp CP. Sounds musculoskeletal. ? H/O PAF but none her or in our records. Exam benign. Labs OK. Enz neg. EKG w/o acute changes. He left AMA during recent CP admission and has been off all of his meds since his recent incarceration 2 months ago. Would restart home meds including plavix. No further cardiac w/u from our point of view. Will arrange OP F/U with Dr Marlou Porch.   Quay Burow 07/22/2016 2:58 PM

## 2016-07-22 NOTE — Progress Notes (Signed)
Patient positive for MRSA in the nares, initiated contact precautions.  Dr. Maylene Roes notified.  Will continue to monitor.

## 2016-07-22 NOTE — Care Management Note (Signed)
Case Management Note  Patient Details  Name: Aaron Dodson MRN: [id number] Date of Birth: 1966-05-10  Subjective/Objective:   Patient lives at home with wife and daughter, pta indep, he has a prosthesis for his aka.   He states he will not need any HH services.  He states he has pcp and he has medication coverage and he has transportation at discharge.  NCM will cont to follow for dc needs.                  Action/Plan:   Expected Discharge Date:                  Expected Discharge Plan:  Home/Self Care  In-House Referral:     Discharge planning Services  CM Consult  Post Acute Care Choice:    Choice offered to:     DME Arranged:    DME Agency:     HH Arranged:    HH Agency:     Status of Service:  In process, will continue to follow  If discussed at Long Length of Stay Meetings, dates discussed:    Additional Comments:  Zenon Mayo, RN 07/22/2016, 3:23 PM

## 2016-07-22 NOTE — ED Notes (Signed)
Attempted Report 

## 2016-07-22 NOTE — Care Management Obs Status (Signed)
[facility] NOTIFICATION   Patient Details  Name: Aaron Dodson MRN: [id number] Date of Birth: [birth year]   Medicare Observation Status Notification Given:  Yes    Zenon Mayo, RN 07/22/2016, 2:07 PM

## 2016-07-22 NOTE — Progress Notes (Signed)
Patient complaining of chest pain 5/10 sharp radiating to his back.  Pt. VSS.  Dr. Maylene Roes paged currently at bedside with patient.  Will continue to monitor.

## 2016-09-21 ENCOUNTER — Emergency Department (HOSPITAL_COMMUNITY)
Admission: EM | Admit: 2016-09-21 | Discharge: 2016-09-21 | Disposition: A | Discharge status: Home / Self Care | Payer: Medicare Other | Attending: Emergency Medicine | Admitting: Emergency Medicine

## 2016-09-21 ENCOUNTER — Encounter (HOSPITAL_COMMUNITY): Payer: Self-pay

## 2016-09-21 ENCOUNTER — Emergency Department (HOSPITAL_COMMUNITY): Payer: Medicare Other

## 2016-09-21 DIAGNOSIS — Z955 Presence of coronary angioplasty implant and graft: Secondary | ICD-10-CM | POA: Diagnosis not present

## 2016-09-21 DIAGNOSIS — I2510 Atherosclerotic heart disease of native coronary artery without angina pectoris: Secondary | ICD-10-CM | POA: Insufficient documentation

## 2016-09-21 DIAGNOSIS — I252 Old myocardial infarction: Secondary | ICD-10-CM | POA: Insufficient documentation

## 2016-09-21 DIAGNOSIS — Z7982 Long term (current) use of aspirin: Secondary | ICD-10-CM | POA: Diagnosis not present

## 2016-09-21 DIAGNOSIS — Z79899 Other long term (current) drug therapy: Secondary | ICD-10-CM | POA: Diagnosis not present

## 2016-09-21 DIAGNOSIS — I10 Essential (primary) hypertension: Secondary | ICD-10-CM | POA: Insufficient documentation

## 2016-09-21 DIAGNOSIS — Z8673 Personal history of transient ischemic attack (TIA), and cerebral infarction without residual deficits: Secondary | ICD-10-CM | POA: Diagnosis not present

## 2016-09-21 DIAGNOSIS — R05 Cough: Secondary | ICD-10-CM | POA: Insufficient documentation

## 2016-09-21 DIAGNOSIS — R0789 Other chest pain: Secondary | ICD-10-CM | POA: Insufficient documentation

## 2016-09-21 DIAGNOSIS — F17210 Nicotine dependence, cigarettes, uncomplicated: Secondary | ICD-10-CM | POA: Diagnosis not present

## 2016-09-21 DIAGNOSIS — R079 Chest pain, unspecified: Secondary | ICD-10-CM | POA: Diagnosis present

## 2016-09-21 LAB — CBC
HCT: 42.8 % (ref 39.0–52.0)
Hemoglobin: 14.6 g/dL (ref 13.0–17.0)
MCH: 32.2 pg (ref 26.0–34.0)
MCHC: 34.1 g/dL (ref 30.0–36.0)
MCV: 94.5 fL (ref 78.0–100.0)
PLATELETS: 286 10*3/uL (ref 150–400)
RBC: 4.53 MIL/uL (ref 4.22–5.81)
RDW: 14.3 % (ref 11.5–15.5)
WBC: 9.3 10*3/uL (ref 4.0–10.5)

## 2016-09-21 LAB — BASIC METABOLIC PANEL
ANION GAP: 9 (ref 5–15)
BUN: 11 mg/dL (ref 6–20)
CALCIUM: 9.8 mg/dL (ref 8.9–10.3)
CO2: 24 mmol/L (ref 22–32)
CREATININE: 0.99 mg/dL (ref 0.61–1.24)
Chloride: 106 mmol/L (ref 101–111)
GFR calc Af Amer: >60 mL/min (ref >60)
GLUCOSE: 100 mg/dL — AB (ref 65–99)
Potassium: 3.9 mmol/L (ref 3.5–5.1)
Sodium: 139 mmol/L (ref 135–145)

## 2016-09-21 LAB — I-STAT TROPONIN, ED
TROPONIN I, POC: 0 ng/mL (ref 0.00–0.08)
Troponin i, poc: 0 ng/mL (ref 0.00–0.08)

## 2016-09-21 LAB — D-DIMER, QUANTITATIVE: D-Dimer, Quant: 0.3 ug/mL-FEU (ref 0.00–0.50)

## 2016-09-21 MED ORDER — MORPHINE SULFATE (PF) 4 MG/ML IV SOLN
4.0000 mg | Freq: Once | INTRAVENOUS | Status: AC
  Administered 2016-09-21: 4 mg via INTRAMUSCULAR

## 2016-09-21 MED ORDER — MORPHINE SULFATE (PF) 4 MG/ML IV SOLN
4.0000 mg | Freq: Once | INTRAVENOUS | Status: DC
  Filled 2016-09-21: qty 1

## 2016-09-21 MED ORDER — TRAMADOL HCL 50 MG PO TABS: 50.0000 mg | ORAL_TABLET | Freq: Four times a day (QID) | ORAL | 0 refills | Status: DC | PRN

## 2016-09-21 NOTE — ED Triage Notes (Signed)
Onset 8am chest pain mid and left sided.  Pt has had recent cough/cold symptoms with worsening in past week.  No respiratory or swallowing difficulties.  Pt talking in complete sentences.

## 2016-09-21 NOTE — Discharge Instructions (Signed)
Return here as needed. Follow up with Dr. Etter Sjogren

## 2016-09-22 ENCOUNTER — Telehealth: Payer: Self-pay | Admitting: Cardiology

## 2016-09-22 NOTE — ED Provider Notes (Signed)
[facility] DEPT Provider Note   CSN: [id number] Arrival date & time: 09/21/16  1524     History   Chief Complaint Chief Complaint  Patient presents with  . Chest Pain    HPI Aaron Dodson is a 50 y.o. male.  HPI Patient presents to the emergency department with sharp chest pain that started at 8 AM the patient states the pain has been constant all day.  He states that it does not radiate.  He states that he has had some upper respiratory symptoms over the last 3 or 4 days.  The patient states that he has had some mild cough and nasal congestion. The patient denies shortness of breath, headache,blurred vision, neck pain, fever, cough, weakness, numbness, dizziness, anorexia, edema, abdominal pain, nausea, vomiting, diarrhea, rash, back pain, dysuria, hematemesis, bloody stool, near syncope, or syncope.  He states he did not take any dictations prior to arrival Past Medical History:  Diagnosis Date  . ADD (attention deficit disorder)   . Anxiety   . Chronic lower back pain   . Club foot   . Coronary artery disease   . Endocarditis    At [facility] 2016  . GERD (gastroesophageal reflux disease)   . Hyperlipidemia   . Hypertension   . Myocardial infarction 2000's - ~ 04/2015   "I've had a total of 3" (11/18/2015)  . PTSD (post-traumatic stress disorder)   . 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 Stroke [facility]) ~ 2006   denies residual on 11/18/2015    Patient Active Problem List   Diagnosis Date Noted  . Pain in the chest 07/22/2016  . CAD s/p PCI 11/19/2015  . Anxiety state   . Chest pain 11/18/2015  . HLD (hyperlipidemia) 11/18/2015  . Essential hypertension 11/18/2015  . Tobacco abuse 11/18/2015    Past Surgical History:  Procedure Laterality Date  . ABOVE KNEE LEG AMPUTATION Left   . CARDIAC CATHETERIZATION  "several"  . CARDIAC CATHETERIZATION N/A 11/26/2015   Procedure: Left Heart Cath and Coronary Angiography;  Surgeon: Burnell Blanks, MD;  Location: [facility];  Service: Cardiovascular;  Laterality: N/A;  . CLUB FOOT RELEASE Left 08/02/1967  . CORONARY ANGIOPLASTY  "several"  . CORONARY ANGIOPLASTY WITH STENT PLACEMENT  "several"   "total of 10 stents placed" (11/18/2015)  . LAPAROSCOPIC CHOLECYSTECTOMY    . ORTHOPEDIC SURGERY  07/12/67-~ 2007   "total of 52 on my left leg; started w/club foot released"  . TONSILLECTOMY         Home Medications    Prior to Admission medications   Medication Sig Start Date End Date Taking? Authorizing Provider  ALPRAZolam Duanne Moron) 1 MG tablet Take 1 mg by mouth 3 (three) times daily as needed for anxiety or sleep.    Yes Historical Provider, MD  aspirin EC 81 MG EC tablet Take 1 tablet (81 mg total) by mouth daily. 07/23/16  Yes Jennifer Chahn-Yang Choi, DO  clopidogrel (PLAVIX) 75 MG tablet Take 1 tablet (75 mg total) by mouth daily. 07/22/16  Yes Jennifer Chahn-Yang Choi, DO  HYDROcodone-acetaminophen (NORCO) 10-325 MG tablet Take 1 tablet by mouth 4 (four) times daily.   Yes Historical Provider, MD  morphine (MSIR) 15 MG tablet Take 15 mg by mouth 2 (two) times daily.   Yes Historical Provider, MD  nebivolol (BYSTOLIC) 5 MG tablet Take 5 mg by mouth daily.   Yes Historical Provider, MD  rosuvastatin (CRESTOR) 5 MG tablet Take  5 mg by mouth daily.   Yes Historical Provider, MD  ibuprofen (ADVIL,MOTRIN) 800 MG tablet Take 1 tablet (800 mg total) by mouth every 6 (six) hours as needed for mild pain or moderate pain. Patient not taking: Reported on 09/21/2016 07/22/16   Shon Millet, DO  traMADol (ULTRAM) 50 MG tablet Take 1 tablet (50 mg total) by mouth every 6 (six) hours as needed for severe pain. 09/21/16   Dalia Heading, PA-C    Family History Family History  Problem Relation Age of Onset  . Hypertension Father   . Lung cancer Father     Social History Social History  Substance Use Topics  . Smoking status: Current Every Day Smoker     Packs/day: 1.00    Years: 35.00    Types: Cigarettes  . Smokeless tobacco: Never Used  . Alcohol use No     Allergies   Benadryl [diphenhydramine hcl]; Promethazine hcl; Augmentin [amoxicillin-pot clavulanate]; and Toradol [ketorolac tromethamine]   Review of Systems Review of Systems All other systems negative except as documented in the HPI. All pertinent positives and negatives as reviewed in the HPI.  Physical Exam Updated Vital Signs BP 158/96   Pulse 89   Temp 98.4 F (36.9 C) (Oral)   Resp 18   Ht 6' (1.829 m)   Wt 88.9 kg   SpO2 100%   BMI 26.58 kg/m   Physical Exam  Constitutional: He is oriented to person, place, and time. He appears well-developed and well-nourished. No distress.  HENT:  Head: Normocephalic and atraumatic.  Nose: Mucosal edema and rhinorrhea present.  Mouth/Throat: Oropharynx is clear and moist.  Eyes: Pupils are equal, round, and reactive to light.  Neck: Normal range of motion. Neck supple.  Cardiovascular: Normal rate, regular rhythm and normal heart sounds.  Exam reveals no gallop and no friction rub.   No murmur heard. Pulmonary/Chest: Effort normal and breath sounds normal. No respiratory distress. He has no wheezes.  Abdominal: Soft. Bowel sounds are normal. He exhibits no distension. There is no tenderness.  Musculoskeletal: He exhibits no edema.  Neurological: He is alert and oriented to person, place, and time. He exhibits normal muscle tone. Coordination normal.  Skin: Skin is warm and dry. No rash noted. No erythema.  Psychiatric: He has a normal mood and affect. His behavior is normal.  Nursing note and vitals reviewed.    ED Treatments / Results  Labs (all labs ordered are listed, but only abnormal results are displayed) Labs Reviewed  BASIC METABOLIC PANEL - Abnormal; Notable for the following:       Result Value   Glucose, Bld 100 (*)    All other components within normal limits  CBC  D-DIMER, QUANTITATIVE (NOT AT  [facility])  I-STAT TROPOININ, ED  I-STAT TROPOININ, ED    EKG  EKG Interpretation  Date/Time:  Tuesday September 21 2016 15:37:11 EST Ventricular Rate:  88 PR Interval:  136 QRS Duration: 88 QT Interval:  350 QTC Calculation: 423 R Axis:   53 Text Interpretation:  Normal sinus rhythm Anterior infarct , age undetermined Abnormal ECG no change from previous Confirmed by Johnney Killian, MD, Jeannie Done [phone]) on 09/21/2016 9:49:05 PM       Radiology Dg Chest 2 View  Result Date: 09/21/2016 CLINICAL DATA:  Hervey Ard left chest pain radiating to back. EXAM: CHEST  2 VIEW COMPARISON:  07/21/2016 FINDINGS: Heart and mediastinal contours are within normal limits. No focal opacities or effusions. No acute bony abnormality. IMPRESSION: No  active cardiopulmonary disease. Electronically Signed   By: Rolm Baptise M.D.   On: 09/21/2016 16:37    Procedures Procedures (including critical care time)  Medications Ordered in ED Medications  morphine 4 MG/ML injection 4 mg (4 mg Intramuscular Given 09/21/16 2155)     Initial Impression / Assessment and Plan / ED Course  I have reviewed the triage vital signs and the nursing notes.  Pertinent labs & imaging results that were available during my care of the patient were reviewed by me and considered in my medical decision making (see chart for details).  Clinical Course     The patient's chest pain is atypical, based on the fact that he had upper respiratory symptoms with cough and that the pain has been constant over the entire day.  The patient has had a cardiac history, this does not seem similar to his previous symptoms.  I advised the patient that he will need to follow-up with Dr. Gypsy Balsam cardiologist told to return here as needed.  Patient agrees the plan and all questions answered.  The patient is PERC negative and low risk based on well's criteria with the negative d-dimer  Final Clinical Impressions(s) / ED Diagnoses   Final diagnoses:  Chest pain,  unspecified type    New Prescriptions Discharge Medication List as of 09/21/2016 10:10 PM    START taking these medications   Details  traMADol (ULTRAM) 50 MG tablet Take 1 tablet (50 mg total) by mouth every 6 (six) hours as needed for severe pain., Starting Tue 09/21/2016, Print         [employer], PA-C 09/22/16 0105    Charlesetta Shanks, MD 10/01/16 [phone]

## 2016-09-22 NOTE — Telephone Encounter (Signed)
Received a call from the [facility] ER stating pt had called about a follow up appt. I called the patient, he stated he was seen in the [facility] ER on 11/14 for cp and was told to follow up in our office.  I scheduled the pt an appt with Truitt Merle, NP on 11/17 @ 11 am.

## 2016-09-24 ENCOUNTER — Ambulatory Visit: Payer: Medicare Other | Admitting: Nurse Practitioner

## 2016-09-24 NOTE — Progress Notes (Deleted)
CARDIOLOGY OFFICE NOTE  Date:  09/24/2016    Aaron Dodson Date of Birth: 1966/08/28 Medical Record [id number]  PCP:  Sandi Mariscal, MD  Cardiologist:  Marlou Porch  No chief complaint on file.   History of Present Illness: Aaron Dodson is a 50 y.o. male who presents today for a work in visit. Seen for Dr. Marlou Porch.   He has never been seen formally in the office.   He has a history of triple vessel CAD s/p remote coronary stenting in [place] and [place] in the past (LAD, OM1 and RCA stents). He had a recent cath here at [facility] in 11/2015 after multiple presentations to the ED for chest pain, revealing nonobstructive CAD. Continued medical therapy recommended. Since then, he was incarcerated for several months and has returned 2 separate occasions with recurrent CP (06/2016- left AMA and again back in September). Other issues include a h/o HTN, HLD, prior IVDU and endocarditis in 2016, chronic LBP for which he takes opioids + h/o anxiety.   In the ER earlier this week - BP was elevated. Chest pain felt to be atypical. Had concurrent URI symptoms.     Comes in today. Here with   Past Medical History:  Diagnosis Date  . ADD (attention deficit disorder)   . Anxiety   . Chronic lower back pain   . Club foot   . Coronary artery disease   . Endocarditis    At [facility] 2016  . GERD (gastroesophageal reflux disease)   . Hyperlipidemia   . Hypertension   . Myocardial infarction 2000's - ~ 04/2015   "I've had a total of 3" (11/18/2015)  . PTSD (post-traumatic stress disorder)   . 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 Stroke [facility]) ~ 2006   denies residual on 11/18/2015    Past Surgical History:  Procedure Laterality Date  . ABOVE KNEE LEG AMPUTATION Left   . CARDIAC CATHETERIZATION  "several"  . CARDIAC CATHETERIZATION N/A 11/26/2015   Procedure: Left Heart Cath and Coronary Angiography;  Surgeon: Burnell Blanks, MD;  Location: [facility];  Service:  Cardiovascular;  Laterality: N/A;  . CLUB FOOT RELEASE Left Aug 24, 1967  . CORONARY ANGIOPLASTY  "several"  . CORONARY ANGIOPLASTY WITH STENT PLACEMENT  "several"   "total of 10 stents placed" (11/18/2015)  . LAPAROSCOPIC CHOLECYSTECTOMY    . ORTHOPEDIC SURGERY  03-Mar-1967-~ 2007   "total of 52 on my left leg; started w/club foot released"  . TONSILLECTOMY       Medications: Current Outpatient Prescriptions  Medication Sig Dispense Refill  . ALPRAZolam (XANAX) 1 MG tablet Take 1 mg by mouth 3 (three) times daily as needed for anxiety or sleep.     Marland Kitchen aspirin EC 81 MG EC tablet Take 1 tablet (81 mg total) by mouth daily. 30 tablet 0  . clopidogrel (PLAVIX) 75 MG tablet Take 1 tablet (75 mg total) by mouth daily. 30 tablet 0  . HYDROcodone-acetaminophen (NORCO) 10-325 MG tablet Take 1 tablet by mouth 4 (four) times daily.    Marland Kitchen ibuprofen (ADVIL,MOTRIN) 800 MG tablet Take 1 tablet (800 mg total) by mouth every 6 (six) hours as needed for mild pain or moderate pain. (Patient not taking: Reported on 09/21/2016) 90 tablet 0  . morphine (MSIR) 15 MG tablet Take 15 mg by mouth 2 (two) times daily.    . nebivolol (BYSTOLIC) 5 MG tablet Take 5 mg by mouth daily.    Marland Kitchen  rosuvastatin (CRESTOR) 5 MG tablet Take 5 mg by mouth daily.    . traMADol (ULTRAM) 50 MG tablet Take 1 tablet (50 mg total) by mouth every 6 (six) hours as needed for severe pain. 15 tablet 0   No current facility-administered medications for this visit.     Allergies: Allergies  Allergen Reactions  . Benadryl [Diphenhydramine Hcl] Other (See Comments)    Aggressive behavior  . Promethazine Hcl Other (See Comments)    Unknown - given for surgical procedure and advised to never take again  . Augmentin [Amoxicillin-Pot Clavulanate] Diarrhea and Nausea Only  . Toradol [Ketorolac Tromethamine] Hives and Other (See Comments)    Redness. Patient states he tolerated ibuprofen fine.     Social History: The patient  reports that he has  been smoking Cigarettes.  He has a 35.00 pack-year smoking history. He has never used smokeless tobacco. He reports that he does not drink alcohol or use drugs.   Family History: The patient's ***family history includes Hypertension in his father; Lung cancer in his father.   Review of Systems: Please see the history of present illness.   Otherwise, the review of systems is positive for {NONE DEFAULTED:18576::"none"}.   All other systems are reviewed and negative.   Physical Exam: VS:  There were no vitals taken for this visit. Marland Kitchen  BMI There is no height or weight on file to calculate BMI.  Wt Readings from Last 3 Encounters:  09/21/16 196 lb (88.9 kg)  07/21/16 177 lb 2 oz (80.3 kg)  06/14/16 168 lb 3.2 oz (76.3 kg)    General: Pleasant. Well developed, well nourished and in no acute distress.   HEENT: Normal.  Neck: Supple, no JVD, carotid bruits, or masses noted.  Cardiac: ***Regular rate and rhythm. No murmurs, rubs, or gallops. No edema.  Respiratory:  Lungs are clear to auscultation bilaterally with normal work of breathing.  GI: Soft and nontender.  MS: No deformity or atrophy. Gait and ROM intact.  Skin: Warm and dry. Color is normal.  Neuro:  Strength and sensation are intact and no gross focal deficits noted.  Psych: Alert, appropriate and with normal affect.   LABORATORY DATA:  EKG:  EKG {ACTION; IS/IS [id number] ordered today. This demonstrates ***.  Lab Results  Component Value Date   WBC 9.3 09/21/2016   HGB 14.6 09/21/2016   HCT 42.8 09/21/2016   PLT 286 09/21/2016   GLUCOSE 100 (H) 09/21/2016   ALT 18 06/13/2016   AST 19 06/13/2016   NA 139 09/21/2016   K 3.9 09/21/2016   CL 106 09/21/2016   CREATININE 0.99 09/21/2016   BUN 11 09/21/2016   CO2 24 09/21/2016   INR 1.02 11/26/2015    BNP (last 3 results)  Recent Labs  11/21/15 1854  BNP 40.4    ProBNP (last 3 results) No results for input(s): PROBNP in the last 8760 hours.   Other Studies  Reviewed Today: Procedures   Left Heart Cath and Coronary Angiography from 11/2015  Conclusion   1. Triple vessel CAD 2. The LAD has mild plaque in the proximal segment and a patent stent in the distal/apical segment without restenosis.  3. The Circumflex has an ostial stent that extends into OM1. There is 40% ostial narrowing within the stent. The continuation of the AV groove Circumflex beyond OM1 is jailed by the stent with 50-70% stenosis. This continuation branch of the AV groove Circumflex is small in caliber.  4. The RCA has patent  stents extending from the ostium of the vessel down into the PDA. There is diffuse mild stent restenosis.  5. The left main has a distal 30% stenosis.  6. Normal LV systolic function  Recommendations: Continue medical management of CAD. He can be discharged tonight after bedrest.    Echo Study Conclusions from 11/2015  - Left ventricle: The cavity size was normal. Wall thickness was   normal. Systolic function was normal. The estimated ejection   fraction was in the range of 50% to 55%. Wall motion was normal;   there were no regional wall motion abnormalities. Doppler   parameters are consistent with abnormal left ventricular   relaxation (grade 1 diastolic dysfunction). - Aortic valve: Valve mobility was restricted. There was mild   regurgitation.  Impressions:  - Technically difficult; low normal to mild global reduction in LV   function; EF 50; grade 1 diastolic dysfunction; aortic valve not   well visualized but appears calcified with reduced cusp   excursion; no AS by doppler; mild AI.   Assessment/Plan:   Current medicines are reviewed with the patient today.  The patient does not have concerns regarding medicines other than what has been noted above.  The following changes have been made:  See above.  Labs/ tests ordered today include:   No orders of the defined types were placed in this encounter.    Disposition:   FU with  *** in {gen number [id number] {Days to years:10300}.   Patient is agreeable to this plan and will call if any problems develop in the interim.   Signed: Burtis Junes, RN, ANP-C 09/24/2016 7:44 AM  [facility] [address] [place], [place]  [address] Phone: [phone] Fax: [phone]

## 2016-09-28 ENCOUNTER — Encounter: Payer: Self-pay | Admitting: Nurse Practitioner

## 2016-09-29 ENCOUNTER — Ambulatory Visit: Payer: Medicare Other | Admitting: Nurse Practitioner

## 2016-10-13 ENCOUNTER — Ambulatory Visit (INDEPENDENT_AMBULATORY_CARE_PROVIDER_SITE_OTHER): Payer: Medicare Other | Admitting: Cardiology

## 2016-10-13 VITALS — BP 130/72 | HR 92 | Ht 72.0 in | Wt 197.0 lb

## 2016-10-13 DIAGNOSIS — I10 Essential (primary) hypertension: Secondary | ICD-10-CM | POA: Diagnosis not present

## 2016-10-13 DIAGNOSIS — Z9861 Coronary angioplasty status: Secondary | ICD-10-CM

## 2016-10-13 DIAGNOSIS — E7800 Pure hypercholesterolemia, unspecified: Secondary | ICD-10-CM | POA: Diagnosis not present

## 2016-10-13 DIAGNOSIS — R0789 Other chest pain: Secondary | ICD-10-CM

## 2016-10-13 DIAGNOSIS — I2510 Atherosclerotic heart disease of native coronary artery without angina pectoris: Secondary | ICD-10-CM

## 2016-10-13 MED ORDER — METOPROLOL SUCCINATE ER 25 MG PO TB24: 25.0000 mg | ORAL_TABLET | Freq: Every day | ORAL | 6 refills | Status: DC

## 2016-10-13 NOTE — Progress Notes (Signed)
Cardiology Office Note    Date:  10/13/2016   ID:  Aaron Dodson, DOB 1966/02/10, MRN [id number]  PCP:  Sandi Mariscal, MD  Cardiologist:   Candee Furbish, MD     History of Present Illness:  Aaron Dodson is a 50 y.o. male with a h/o triple vessel CAD s/p remote coronary stenting in [place] and [place] in the past (LAD, OM1 and RCA stents). He had a recent cath here at [facility] in 11/2015 after multiple presentations to the ED for chest pain, revealing nonobstructive CAD. Continued medical therapy recommended. Since then, he was incarcerated for several months and has returned 2 separate occasions with recurrent CP (06/2016- left AMA and + current admission).  Also with a h/o HTN, HLD, prior IVDU and endocarditis in 2016, chronic LBP for which he takes opioids + h/o anxiety.   Occasional "goes into AFIB" I do not see any previous EKGs demonstrating this. Dyspnea moderate walking across parking lot, continues to smoke. 54 ortho surgeries.   Prior CVA.   Past Medical History:  Diagnosis Date  . ADD (attention deficit disorder)   . Anxiety   . Chronic lower back pain   . Club foot   . Coronary artery disease   . Endocarditis    At [facility] 2016  . GERD (gastroesophageal reflux disease)   . Hyperlipidemia   . Hypertension   . Myocardial infarction 2000's - ~ 04/2015   "I've had a total of 3" (11/18/2015)  . PTSD (post-traumatic stress disorder)   . 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 Stroke [facility]) ~ 2006   denies residual on 11/18/2015    Past Surgical History:  Procedure Laterality Date  . ABOVE KNEE LEG AMPUTATION Left   . CARDIAC CATHETERIZATION  "several"  . CARDIAC CATHETERIZATION N/A 11/26/2015   Procedure: Left Heart Cath and Coronary Angiography;  Surgeon: Burnell Blanks, MD;  Location: [facility];  Service: Cardiovascular;  Laterality: N/A;  . CLUB FOOT RELEASE Left Aug 18, 1967  . CORONARY ANGIOPLASTY  "several"  . CORONARY ANGIOPLASTY WITH STENT  PLACEMENT  "several"   "total of 10 stents placed" (11/18/2015)  . LAPAROSCOPIC CHOLECYSTECTOMY    . ORTHOPEDIC SURGERY  07-12-67-~ 2007   "total of 52 on my left leg; started w/club foot released"  . TONSILLECTOMY      Current Medications: Outpatient Medications Prior to Visit  Medication Sig Dispense Refill  . ALPRAZolam (XANAX) 1 MG tablet Take 1 mg by mouth 3 (three) times daily as needed for anxiety or sleep.     Marland Kitchen aspirin EC 81 MG EC tablet Take 1 tablet (81 mg total) by mouth daily. 30 tablet 0  . clopidogrel (PLAVIX) 75 MG tablet Take 1 tablet (75 mg total) by mouth daily. 30 tablet 0  . HYDROcodone-acetaminophen (NORCO) 10-325 MG tablet Take 1 tablet by mouth 4 (four) times daily.    Marland Kitchen ibuprofen (ADVIL,MOTRIN) 800 MG tablet Take 1 tablet (800 mg total) by mouth every 6 (six) hours as needed for mild pain or moderate pain. 90 tablet 0  . morphine (MSIR) 15 MG tablet Take 15 mg by mouth 2 (two) times daily.    . rosuvastatin (CRESTOR) 5 MG tablet Take 5 mg by mouth daily.    . traMADol (ULTRAM) 50 MG tablet Take 1 tablet (50 mg total) by mouth every 6 (six) hours as needed for severe pain. 15 tablet 0  . nebivolol (BYSTOLIC) 5 MG tablet Take  5 mg by mouth daily.     No facility-administered medications prior to visit.      Allergies:   Benadryl [diphenhydramine hcl]; Promethazine hcl; Augmentin [amoxicillin-pot clavulanate]; and Toradol [ketorolac tromethamine]   Social History   Social History  . Marital status: Divorced    Spouse name: N/A  . Number of children: N/A  . Years of education: N/A   Social History Main Topics  . Smoking status: Current Every Day Smoker    Packs/day: 1.00    Years: 35.00    Types: Cigarettes  . Smokeless tobacco: Never Used  . Alcohol use No  . Drug use: No     Comment: 8/7, sober/clear >1 year since endocarditis  . Sexual activity: Not Currently   Other Topics Concern  . Not on file   Social History Narrative   Lives with ex wife  by his report.  On disability.       Family History:  The patient's family history includes Hypertension in his father; Lung cancer in his father.   ROS:   Please see the history of present illness.    ROS All other systems reviewed and are negative.   PHYSICAL EXAM:   VS:  BP 130/72   Pulse 92   Ht 6' (1.829 m)   Wt 197 lb (89.4 kg)   BMI 26.72 kg/m    GEN: Well nourished, well developed, in no acute distress  HEENT: normal  Neck: no JVD, carotid bruits, or masses Cardiac: RRR; no murmurs, rubs, or gallops,no edema  Respiratory:  clear to auscultation bilaterally, normal work of breathing GI: soft, nontender, nondistended, + BS MS: no deformity or atrophy  Skin: warm and dry, no rash Neuro:  Alert and Oriented x 3, Strength and sensation are intact Psych: euthymic mood, full affect  Wt Readings from Last 3 Encounters:  10/13/16 197 lb (89.4 kg)  09/21/16 196 lb (88.9 kg)  07/21/16 177 lb 2 oz (80.3 kg)      Studies/Labs Reviewed:   EKG:  EKG is not ordered today.    Recent Labs: 11/21/2015: B Natriuretic Peptide 40.4 06/13/2016: ALT 18 09/21/2016: BUN 11; Creatinine, Ser 0.99; Hemoglobin 14.6; Platelets 286; Potassium 3.9; Sodium 139   Lipid Panel No results found for: CHOL, TRIG, HDL, CHOLHDL, VLDL, LDLCALC, LDLDIRECT  Additional studies/ records that were reviewed today include:  Prior office notes reviewed, lab work reviewed.  Cardiac cath 11/26/15: Conclusion   1. Triple vessel CAD 2. The LAD has mild plaque in the proximal segment and a patent stent in the distal/apical segment without restenosis.  3. The Circumflex has an ostial stent that extends into OM1. There is 40% ostial narrowing within the stent. The continuation of the AV groove Circumflex beyond OM1 is jailed by the stent with 50-70% stenosis. This continuation branch of the AV groove Circumflex is small in caliber.  4. The RCA has patent stents extending from the ostium of the vessel down into the  PDA. There is diffuse mild stent restenosis.  5. The left main has a distal 30% stenosis.  6. Normal LV systolic function  Recommendations: Continue medical management of CAD. He can be discharged tonight after bedrest.    ECHO 11/26/15: - Left ventricle: The cavity size was normal. Wall thickness was   normal. Systolic function was normal. The estimated ejection   fraction was in the range of 50% to 55%. Wall motion was normal;   there were no regional wall motion abnormalities. Doppler  parameters are consistent with abnormal left ventricular   relaxation (grade 1 diastolic dysfunction). - Aortic valve: Valve mobility was restricted. There was mild   regurgitation.  Impressions:  - Technically difficult; low normal to mild global reduction in LV   function; EF 50; grade 1 diastolic dysfunction; aortic valve not   well visualized but appears calcified with reduced cusp   excursion; no AS by doppler; mild AI.    ASSESSMENT:    1. Atypical chest pain   2. CAD S/P - s/p RCA/ CFX   3. Pure hypercholesterolemia   4. Essential hypertension      PLAN:  In order of problems listed above:  Atypical chest pain  - Musculoskeletal type discomfort in the past. Left heart catheterization in January 2017 showed patent stents, nonobstructive coronary artery disease, normal EF.  Coronary artery disease  - Continue with aggressive secondary prevention.  - Prior stenting of LAD, obtuse marginal 1 and RCA.  - Continue with aspirin and Plavix. Encourage beta blocker, used to be on Bystolic (Thought it was mainly for his blood pressure which has been fairly well controlled.)  - Will switch to metoprolol ER 25mg .  - He previously had been seen by [facility] in [place].  Hyperlipidemia  - Encourage Crestor use.  Essential hypertension  - Blood pressure under good control.  Left above-knee amputation  - Prosthesis, born with a clubfoot, nerve damage  Tobacco use  -  Encourage cessation. 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   Medication Adjustments/Labs and Tests Ordered: Current medicines are reviewed at length with the patient today.  Concerns regarding medicines are outlined above.  Medication changes, Labs and Tests ordered today are listed in the Patient Instructions below. Patient Instructions  Medication Instructions:  Please stop your Bystolic and start Metoprolol ER 25 mg a day. Continue all other medications as listed.  Follow-Up: Follow up in 6 months with Bonney Leitz, PA.  You will receive a letter in the mail 2 months before you are due.  Please call us when you receive this letter to schedule your follow up appointment.  If you need a refill on your cardiac medications before your next appointment, please call your pharmacy.  Thank you for choosing [facility]!!        Signed, Candee Furbish, MD  10/13/2016 10:28 AM    [facility] [place], [place], [place]  [address] Phone: [phone]; Fax: [phone]

## 2016-10-13 NOTE — Patient Instructions (Signed)
Medication Instructions:  Please stop your Bystolic and start Metoprolol ER 25 mg a day. Continue all other medications as listed.  Follow-Up: Follow up in 6 months with Bonney Leitz, PA.  You will receive a letter in the mail 2 months before you are due.  Please call us when you receive this letter to schedule your follow up appointment.  If you need a refill on your cardiac medications before your next appointment, please call your pharmacy.  Thank you for choosing [facility]!!

## 2016-10-15 ENCOUNTER — Encounter (HOSPITAL_COMMUNITY): Payer: Self-pay | Admitting: Emergency Medicine

## 2016-10-15 ENCOUNTER — Emergency Department (HOSPITAL_COMMUNITY)
Admission: EM | Admit: 2016-10-15 | Discharge: 2016-10-15 | Disposition: A | Discharge status: Home / Self Care | Payer: Medicare Other | Attending: Emergency Medicine | Admitting: Emergency Medicine

## 2016-10-15 ENCOUNTER — Emergency Department (HOSPITAL_COMMUNITY): Payer: Medicare Other

## 2016-10-15 DIAGNOSIS — F17210 Nicotine dependence, cigarettes, uncomplicated: Secondary | ICD-10-CM | POA: Insufficient documentation

## 2016-10-15 DIAGNOSIS — I2583 Coronary atherosclerosis due to lipid rich plaque: Secondary | ICD-10-CM

## 2016-10-15 DIAGNOSIS — Z7982 Long term (current) use of aspirin: Secondary | ICD-10-CM | POA: Insufficient documentation

## 2016-10-15 DIAGNOSIS — I252 Old myocardial infarction: Secondary | ICD-10-CM | POA: Insufficient documentation

## 2016-10-15 DIAGNOSIS — R072 Precordial pain: Secondary | ICD-10-CM

## 2016-10-15 DIAGNOSIS — E785 Hyperlipidemia, unspecified: Secondary | ICD-10-CM | POA: Diagnosis present

## 2016-10-15 DIAGNOSIS — I2510 Atherosclerotic heart disease of native coronary artery without angina pectoris: Secondary | ICD-10-CM | POA: Insufficient documentation

## 2016-10-15 DIAGNOSIS — I10 Essential (primary) hypertension: Secondary | ICD-10-CM | POA: Diagnosis not present

## 2016-10-15 DIAGNOSIS — F909 Attention-deficit hyperactivity disorder, unspecified type: Secondary | ICD-10-CM | POA: Insufficient documentation

## 2016-10-15 DIAGNOSIS — F17200 Nicotine dependence, unspecified, uncomplicated: Secondary | ICD-10-CM | POA: Diagnosis present

## 2016-10-15 DIAGNOSIS — R079 Chest pain, unspecified: Secondary | ICD-10-CM

## 2016-10-15 LAB — CBC WITH DIFFERENTIAL/PLATELET
BASOS PCT: 0 %
Basophils Absolute: 0 10*3/uL (ref 0.0–0.1)
EOS ABS: 0.2 10*3/uL (ref 0.0–0.7)
Eosinophils Relative: 2 %
HCT: 40.4 % (ref 39.0–52.0)
Hemoglobin: 14.1 g/dL (ref 13.0–17.0)
LYMPHS ABS: 3.1 10*3/uL (ref 0.7–4.0)
Lymphocytes Relative: 33 %
MCH: 32.8 pg (ref 26.0–34.0)
MCHC: 34.9 g/dL (ref 30.0–36.0)
MCV: 94 fL (ref 78.0–100.0)
Monocytes Absolute: 1 10*3/uL (ref 0.1–1.0)
Monocytes Relative: 10 %
NEUTROS PCT: 55 %
Neutro Abs: 5.3 10*3/uL (ref 1.7–7.7)
PLATELETS: 318 10*3/uL (ref 150–400)
RBC: 4.3 MIL/uL (ref 4.22–5.81)
RDW: 14.1 % (ref 11.5–15.5)
WBC: 9.6 10*3/uL (ref 4.0–10.5)

## 2016-10-15 LAB — BASIC METABOLIC PANEL
Anion gap: 9 (ref 5–15)
BUN: 10 mg/dL (ref 6–20)
CO2: 24 mmol/L (ref 22–32)
CREATININE: 1 mg/dL (ref 0.61–1.24)
Calcium: 9.7 mg/dL (ref 8.9–10.3)
Chloride: 105 mmol/L (ref 101–111)
Glucose, Bld: 74 mg/dL (ref 65–99)
POTASSIUM: 5 mmol/L (ref 3.5–5.1)
SODIUM: 138 mmol/L (ref 135–145)

## 2016-10-15 LAB — I-STAT TROPONIN, ED: TROPONIN I, POC: 0 ng/mL (ref 0.00–0.08)

## 2016-10-15 MED ORDER — HYDROMORPHONE HCL 2 MG/ML IJ SOLN
0.5000 mg | Freq: Once | INTRAMUSCULAR | Status: AC
  Administered 2016-10-15: 0.5 mg via INTRAVENOUS
  Filled 2016-10-15: qty 1

## 2016-10-15 MED ORDER — HYDROMORPHONE HCL 2 MG/ML IJ SOLN
1.0000 mg | Freq: Once | INTRAMUSCULAR | Status: AC
  Administered 2016-10-15: 1 mg via INTRAVENOUS
  Filled 2016-10-15: qty 1

## 2016-10-15 MED ORDER — NITROGLYCERIN 2 % TD OINT
1.0000 [in_us] | TOPICAL_OINTMENT | Freq: Once | TRANSDERMAL | Status: AC
  Administered 2016-10-15: 1 [in_us] via TOPICAL
  Filled 2016-10-15: qty 1

## 2016-10-15 NOTE — Discharge Instructions (Signed)
You have chosen to leave the emergency department without getting a study that would tell us if you're having an aortic dissection. You can change her mind at any time and return to the emergency department.  Please follow with your primary care doctor or cardiologist for recheck in the next 2 days.

## 2016-10-15 NOTE — Consult Note (Signed)
CARDIOLOGY CONSULT NOTE   Patient ID: Dacion Faulk MRN: [id number] DOB/AGE: 12/23/1965 50 y.o.  Admit date: 10/15/2016  Requesting Physician: Dr. Winfred Leeds  Primary Physician:   Sandi Mariscal, MD Primary Cardiologist:   Dr. Marlou Porch Reason for Consultation:   Chest pain  HPI: Soliman Puleio is a 50 y.o. male with a history of CAD s/p remote coronary stenting in [place] and [place] in the past (LAD, OM1 and RCA stents), CVA, HTN, HLD, prior IVDA with endocarditis in 2016, chronic back pain on chronic pain meds, club foot s/p L AKA, ongoing tobacco abuse and anxiety who presented to [facility] on 10/15/16 with chest pain.    He had a recent cath here at [facility] in 11/2015 after multiple presentations to the ED for chest pain, revealing nonobstructive CAD. Continued medical therapy recommended. Since then, he was incarcerated for several months and has returned 2 separate occasions with recurrent CP (06/2016- left AMA and 07/2016 CP felt to be MSK).  He was seen in the office earlier this week with Dr. Marlou Porch and doing well. Bystolic switched to Toprol XL. He has had chest pressure since last night. The chest pain woke him up from sleep and he felt like his dog was sitting on his chest. Currently having left sided, 7/10 chest pain. It was coming and going and now its more constant. He thinks it may have started fter an argument with his daughter last night. He has taken some SL NTG. One dose helped but other did not. Now has nitro paste on and not really helping. + SOB and diaphoresis. No radiation. He has chronic dyspnea on exertion. No LE edema, orthopnea or PND. No dizziness or syncope. No blood in stool or urine.   Still smokes a pack per day. No  Illicit drugs.     Past Medical History:  Diagnosis Date  . ADD (attention deficit disorder)   . Anxiety   . Chronic lower back pain   . Club foot   . Coronary artery disease   . Endocarditis    At [facility] 2016  . GERD (gastroesophageal reflux  disease)   . Hyperlipidemia   . Hypertension   . Myocardial infarction 2000's - ~ 04/2015   "I've had a total of 3" (11/18/2015)  . PTSD (post-traumatic stress disorder)   . 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 Stroke [facility]) ~ 2006   denies residual on 11/18/2015     Past Surgical History:  Procedure Laterality Date  . ABOVE KNEE LEG AMPUTATION Left   . CARDIAC CATHETERIZATION  "several"  . CARDIAC CATHETERIZATION N/A 11/26/2015   Procedure: Left Heart Cath and Coronary Angiography;  Surgeon: Burnell Blanks, MD;  Location: [facility];  Service: Cardiovascular;  Laterality: N/A;  . CLUB FOOT RELEASE Left 1967/07/04  . CORONARY ANGIOPLASTY  "several"  . CORONARY ANGIOPLASTY WITH STENT PLACEMENT  "several"   "total of 10 stents placed" (11/18/2015)  . LAPAROSCOPIC CHOLECYSTECTOMY    . ORTHOPEDIC SURGERY  03/08/1967-~ 2007   "total of 52 on my left leg; started w/club foot released"  . TONSILLECTOMY      Allergies  Allergen Reactions  . Benadryl [Diphenhydramine Hcl] Other (See Comments)    Aggressive behavior  . Promethazine Hcl Other (See Comments)    Unknown - given for surgical procedure and advised to never take again  . Augmentin [Amoxicillin-Pot Clavulanate] Diarrhea and Nausea Only  . Toradol [Ketorolac Tromethamine]  Hives and Other (See Comments)    Redness. Patient states he tolerated ibuprofen fine.     I have reviewed the patient's current medications     Prior to Admission medications   Medication Sig Start Date End Date Taking? Authorizing Provider  ALPRAZolam Duanne Moron) 1 MG tablet Take 1 mg by mouth 3 (three) times daily as needed for anxiety or sleep.     Historical Provider, MD  aspirin EC 81 MG EC tablet Take 1 tablet (81 mg total) by mouth daily. 07/23/16   Shon Millet, DO  clopidogrel (PLAVIX) 75 MG tablet Take 1 tablet (75 mg total) by mouth daily. 07/22/16   Jennifer Chahn-Yang Choi, DO  HYDROcodone-acetaminophen  (NORCO) 10-325 MG tablet Take 1 tablet by mouth 4 (four) times daily.    Historical Provider, MD  ibuprofen (ADVIL,MOTRIN) 800 MG tablet Take 1 tablet (800 mg total) by mouth every 6 (six) hours as needed for mild pain or moderate pain. 07/22/16   Rich Fuchs Choi, DO  metoprolol succinate (TOPROL-XL) 25 MG 24 hr tablet Take 1 tablet (25 mg total) by mouth daily. 10/13/16   Jerline Pain, MD  morphine (MSIR) 15 MG tablet Take 15 mg by mouth 2 (two) times daily.    Historical Provider, MD  rosuvastatin (CRESTOR) 5 MG tablet Take 5 mg by mouth daily.    Historical Provider, MD  traMADol (ULTRAM) 50 MG tablet Take 1 tablet (50 mg total) by mouth every 6 (six) hours as needed for severe pain. 09/21/16   Dalia Heading, PA-C     Social History   Social History  . Marital status: Divorced    Spouse name: N/A  . Number of children: N/A  . Years of education: N/A   Occupational History  . Not on file.   Social History Main Topics  . Smoking status: Current Every Day Smoker    Packs/day: 1.00    Years: 35.00    Types: Cigarettes  . Smokeless tobacco: Never Used  . Alcohol use No  . Drug use: No     Comment: 8/7, sober/clear >1 year since endocarditis  . Sexual activity: Not Currently   Other Topics Concern  . Not on file   Social History Narrative   Lives with ex wife by his report.  On disability.      Family Status  Relation Status  . Father    Family History  Problem Relation Age of Onset  . Hypertension Father   . Lung cancer Father     ROS:  Full 14 point review of systems complete and found to be negative unless listed above.  Physical Exam: Blood pressure 121/92, pulse 85, temperature 98.6 F (37 C), temperature source Oral, resp. rate 11, SpO2 97 %.  General: Well developed, well nourished, male in no acute distress Head: Eyes PERRLA, No xanthomas.   Normocephalic and atraumatic, oropharynx without edema or exudate.  Lungs: CTAB Heart: HRRR S1 S2, no  rub/gallop, Heart regular rate and rhythm with S1, S2 no murmur. pulses are 2+ extrem.   Neck: No carotid bruits. No lymphadenopathy.  No JVD. Abdomen: Bowel sounds present, abdomen soft and non-tender without masses or hernias noted. Msk:  No spine or cva tenderness. No weakness, no joint deformities or effusions. Extremities: No clubbing or cyanosis.  No LE  edema.  Neuro: Alert and oriented X 3. No focal deficits noted. Psych:  Good affect, responds appropriately Skin: No rashes or lesions noted.  Labs:   Lab  Results  Component Value Date   WBC 9.6 10/15/2016   HGB 14.1 10/15/2016   HCT 40.4 10/15/2016   MCV 94.0 10/15/2016   PLT 318 10/15/2016   No results for input(s): INR in the last 72 hours.   Recent Labs Lab 10/15/16 1235  NA 138  K 5.0  CL 105  CO2 24  BUN 10  CREATININE 1.00  CALCIUM 9.7  GLUCOSE 74   No results found for: MG No results for input(s): CKTOTAL, CKMB, TROPONINI in the last 72 hours.  Recent Labs  10/15/16 1241  TROPIPOC 0.00   No results found for: PROBNP No results found for: CHOL, HDL, LDLCALC, TRIG Lab Results  Component Value Date   DDIMER 0.30 09/21/2016   No results found for: LIPASE, AMYLASE No results found for: TSH, T4TOTAL, T3FREE, THYROIDAB No results found for: VITAMINB12, FOLATE, FERRITIN, TIBC, IRON, RETICCTPCT  Echo: 11/26/2015 LV EF: 50% -   55% Study Conclusions - Left ventricle: The cavity size was normal. Wall thickness was   normal. Systolic function was normal. The estimated ejection   fraction was in the range of 50% to 55%. Wall motion was normal;   there were no regional wall motion abnormalities. Doppler   parameters are consistent with abnormal left ventricular   relaxation (grade 1 diastolic dysfunction). - Aortic valve: Valve mobility was restricted. There was mild   regurgitation. Impressions: - Technically difficult; low normal to mild global reduction in LV   function; EF 50; grade 1 diastolic  dysfunction; aortic valve not   well visualized but appears calcified with reduced cusp   excursion; no AS by doppler; mild AI.   Cardiac cath 11/26/15: Conclusion   1. Triple vessel CAD 2. The LAD has mild plaque in the proximal segment and a patent stent in the distal/apical segment without restenosis.  3. The Circumflex has an ostial stent that extends into OM1. There is 40% ostial narrowing within the stent. The continuation of the AV groove Circumflex beyond OM1 is jailed by the stent with 50-70% stenosis. This continuation branch of the AV groove Circumflex is small in caliber.  4. The RCA has patent stents extending from the ostium of the vessel down into the PDA. There is diffuse mild stent restenosis.  5. The left main has a distal 30% stenosis.  6. Normal LV systolic function  Recommendations: Continue medical management of CAD. He can be discharged tonight after bedrest.      ECG:  Sinus tach HR 102  Radiology:  Dg Chest 2 View  Result Date: 10/15/2016 CLINICAL DATA:  Chest pressure EXAM: CHEST  2 VIEW COMPARISON:  September 21, 2016 FINDINGS: The heart size and mediastinal contours are within normal limits. Both lungs are clear. The visualized skeletal structures are unremarkable. IMPRESSION: No active cardiopulmonary disease. Electronically Signed   By: Dorise Bullion III M.D   On: 10/15/2016 13:05    ASSESSMENT AND PLAN:    Principal Problem:   Chest pain Active Problems:   HLD (hyperlipidemia)   Essential hypertension   Tobacco abuse   CAD s/p PCI  Wilmar Bruney is a 50 y.o. male with a history of CAD s/p remote coronary stenting in [place] in the past (LAD, OM1 and RCA stents), CVA, HTN, HLD, prior IVDA with endocarditis in 2016, chronic back pain on chronic pain meds, club foot s/p L AKA, ongoing tobacco abuse and anxiety who presented to [facility] on 10/15/16 with chest pain.   Chest pain:  difficult since he has known CAD with multiple PCIs. He also  has chronic chest pain and a recent cath with no targets for intervention. Plan to treat medically. He has no objective signs of ischemia at this point and has been in constant pain for at least 11 hours. Troponin negative x 3 and ECG normal. Would recommend CTA to rule out PE and if negative discharge home.   HTN: BP with moderate control. Continue current regimen  HLD: continue statin   Tobacco abuse: counseled on cessation.   Signed: Angelena Form, PA-C 10/15/2016 2:03 PM  Pager [phone]  Co-Sign MD  I have seen and examined the patient along with Angelena Form, PA-C.  I have reviewed the chart, notes and new data.  I agree with PA's note.  Key new complaints: chest pain persists. To me he describes it as different from previous angina. Key examination changes: no CHF, no arrhythmia. Normal CV exam. Clear luncgs Key new findings / data: ECG, CXR and cardiac enzymes are all low risk. Troponin undetectable > 8 h after symptom onset.  PLAN: Strongly doubt coronary etiology for symptoms, even taking into account his rich coronary history. Risk of major acute coronary event is low. Consider pulmonary embolism or aortic pathology, although the CXR does not support either one. If workup is negative, will schedule for an outpatient Lexiscan Myoview next week.  Sanda Klein, MD, [place] [phone] 10/15/2016, 2:55 PM

## 2016-10-15 NOTE — ED Notes (Signed)
IV attempt x 1 unsuccessful. ?

## 2016-10-15 NOTE — ED Notes (Signed)
Wasted 1.5 mg dilaudid and 1 mg dilaudid with RN Alecia.

## 2016-10-15 NOTE — ED Provider Notes (Signed)
[facility] DEPT Provider Note   CSN: [id number] Arrival date & time: 10/15/16  1024     History   Chief Complaint Chief Complaint  Patient presents with  . Chest Pain    HPI   Blood pressure 144/97, pulse 86, temperature 98.6 F (37 C), temperature source Oral, resp. rate 16, SpO2 99 %.  Aaron Dodson is a 50 y.o. male past medical history significant for ACS, status post 10 stents, last cath 1 year ago. Complaining of intermittent chest pain over the course of the last month, he followed with his cardiologist 2 days ago and was advised to return to the ED if symptoms worsened. He had a verbal altercation with his 61 year old daughter last night which upset him. He had some pressure-like retrosternal chest pain associated with feeling lightheaded onset at 3 AM, woke him from sleep. He is compliant with his Plavix which she took this morning. He took 3 sublingual nitroglycerin which initially alleviated the pain caused a severe headache, pain at this point is 8 out of 10, he denies cough above his baseline, fever, chills, diaphoresis, syncope. No IVDU in 1.5 years.  Cardiology: Dr. Marlou Porch  Past Medical History:  Diagnosis Date  . ADD (attention deficit disorder)   . Anxiety   . Chronic lower back pain   . Club foot   . Coronary artery disease   . Endocarditis    At [facility]  . GERD (gastroesophageal reflux disease)   . Hyperlipidemia   . Hypertension   . Myocardial infarction 2000's - ~ 04/2015   "I've had a total of 3" (11/18/2015)  . PTSD (post-traumatic stress disorder)   . 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 Stroke [facility]) ~ 2006   denies residual on 11/18/2015    Patient Active Problem List   Diagnosis Date Noted  . CAD s/p PCI 11/19/2015  . Anxiety state   . Chest pain 11/18/2015  . HLD (hyperlipidemia) 11/18/2015  . Essential hypertension 11/18/2015  . Tobacco abuse 11/18/2015    Past Surgical History:  Procedure Laterality Date    . ABOVE KNEE LEG AMPUTATION Left   . CARDIAC CATHETERIZATION  "several"  . CARDIAC CATHETERIZATION N/A 11/26/2015   Procedure: Left Heart Cath and Coronary Angiography;  Surgeon: Burnell Blanks, MD;  Location: [facility];  Service: Cardiovascular;  Laterality: N/A;  . CLUB FOOT RELEASE Left Aug 15, 1967  . CORONARY ANGIOPLASTY  "several"  . CORONARY ANGIOPLASTY WITH STENT PLACEMENT  "several"   "total of 10 stents placed" (11/18/2015)  . LAPAROSCOPIC CHOLECYSTECTOMY    . ORTHOPEDIC SURGERY  01-Jul-1967-~ 2007   "total of 52 on my left leg; started w/club foot released"  . TONSILLECTOMY         Home Medications    Prior to Admission medications   Medication Sig Start Date End Date Taking? Authorizing Provider  ALPRAZolam Duanne Moron) 1 MG tablet Take 1 mg by mouth 3 (three) times daily as needed for anxiety or sleep.     Historical Provider, MD  aspirin EC 81 MG EC tablet Take 1 tablet (81 mg total) by mouth daily. 07/23/16   Shon Millet, DO  clopidogrel (PLAVIX) 75 MG tablet Take 1 tablet (75 mg total) by mouth daily. 07/22/16   Jennifer Chahn-Yang Choi, DO  HYDROcodone-acetaminophen (NORCO) 10-325 MG tablet Take 1 tablet by mouth 4 (four) times daily.    Historical Provider, MD  ibuprofen (ADVIL,MOTRIN) 800 MG tablet Take 1 tablet (  800 mg total) by mouth every 6 (six) hours as needed for mild pain or moderate pain. 07/22/16   Rich Fuchs Choi, DO  metoprolol succinate (TOPROL-XL) 25 MG 24 hr tablet Take 1 tablet (25 mg total) by mouth daily. 10/13/16   Jerline Pain, MD  morphine (MSIR) 15 MG tablet Take 15 mg by mouth 2 (two) times daily.    Historical Provider, MD  rosuvastatin (CRESTOR) 5 MG tablet Take 5 mg by mouth daily.    Historical Provider, MD  traMADol (ULTRAM) 50 MG tablet Take 1 tablet (50 mg total) by mouth every 6 (six) hours as needed for severe pain. 09/21/16   Dalia Heading, PA-C    Family History Family History  Problem Relation Age of  Onset  . Hypertension Father   . Lung cancer Father     Social History Social History  Substance Use Topics  . Smoking status: Current Every Day Smoker    Packs/day: 1.00    Years: 35.00    Types: Cigarettes  . Smokeless tobacco: Never Used  . Alcohol use No     Allergies   Benadryl [diphenhydramine hcl]; Promethazine hcl; Augmentin [amoxicillin-pot clavulanate]; and Toradol [ketorolac tromethamine]   Review of Systems Review of Systems  10 systems reviewed and found to be negative, except as noted in the HPI.  Physical Exam Updated Vital Signs BP (!) 126/104   Pulse 87   Temp 98.6 F (37 C) (Oral)   Resp 12   SpO2 96%   Physical Exam  Constitutional: He is oriented to person, place, and time. He appears well-developed and well-nourished. No distress.  HENT:  Head: Normocephalic and atraumatic.  Mouth/Throat: Oropharynx is clear and moist.  Eyes: Conjunctivae and EOM are normal. Pupils are equal, round, and reactive to light.  Neck: Normal range of motion. No JVD present. No tracheal deviation present.  Cardiovascular: Normal rate, regular rhythm and intact distal pulses.   Radial pulse equal bilaterally  Pulmonary/Chest: Effort normal and breath sounds normal. No stridor. No respiratory distress. He has no wheezes. He has no rales. He exhibits no tenderness.  Abdominal: Soft. He exhibits no distension and no mass. There is no tenderness. There is no rebound and no guarding.  Musculoskeletal: Normal range of motion. He exhibits no edema or tenderness.  No calf asymmetry, superficial collaterals, palpable cords, edema, Homans sign negative bilaterally.    Neurological: He is alert and oriented to person, place, and time.  Skin: Skin is warm. He is not diaphoretic.  Psychiatric: He has a normal mood and affect.  Nursing note and vitals reviewed.    ED Treatments / Results  Labs (all labs ordered are listed, but only abnormal results are displayed) Labs  Reviewed  CBC WITH DIFFERENTIAL/PLATELET  BASIC METABOLIC PANEL  I-STAT TROPOININ, ED    EKG  EKG Interpretation  Date/Time:  Friday October 15 2016 10:33:00 EST Ventricular Rate:  102 PR Interval:  182 QRS Duration: 82 QT Interval:  334 QTC Calculation: 435 R Axis:   58 Text Interpretation:  Sinus tachycardia Otherwise normal ECG No significant change since last tracing Confirmed by Winfred Leeds  MD, SAM [phone]) on 10/15/2016 11:59:48 AM       Radiology Dg Chest 2 View  Result Date: 10/15/2016 CLINICAL DATA:  Chest pressure EXAM: CHEST  2 VIEW COMPARISON:  September 21, 2016 FINDINGS: The heart size and mediastinal contours are within normal limits. Both lungs are clear. The visualized skeletal structures are unremarkable. IMPRESSION: No active cardiopulmonary  disease. Electronically Signed   By: Dorise Bullion III M.D   On: 10/15/2016 13:05    Procedures Procedures (including critical care time)  Medications Ordered in ED Medications  HYDROmorphone (DILAUDID) injection 0.5 mg (0.5 mg Intravenous Given 10/15/16 1248)  nitroGLYCERIN (NITROGLYN) 2 % ointment 1 inch (1 inch Topical Given 10/15/16 1401)  HYDROmorphone (DILAUDID) injection 1 mg (1 mg Intravenous Given 10/15/16 1357)     Initial Impression / Assessment and Plan / ED Course  I have reviewed the triage vital signs and the nursing notes.  Pertinent labs & imaging results that were available during my care of the patient were reviewed by me and considered in my medical decision making (see chart for details).  Clinical Course as of Oct 15 1552  Fri Oct 15, 2016  1321 Discussed with Katie from cardiology who will evaluate the patient and make recommendations with respect to admission and administering heparin.  [NP]    Clinical Course User Index [NP] Monico Blitz, PA-C    Vitals:   10/15/16 1330 10/15/16 1345 10/15/16 1400 10/15/16 1415  BP: 128/85 118/77 (!) 126/104   Pulse: 78 82 88 87  Resp:    12    Temp:      TempSrc:      SpO2: 95% 96% 99% 96%    Medications  HYDROmorphone (DILAUDID) injection 0.5 mg (0.5 mg Intravenous Given 10/15/16 1248)  nitroGLYCERIN (NITROGLYN) 2 % ointment 1 inch (1 inch Topical Given 10/15/16 1401)  HYDROmorphone (DILAUDID) injection 1 mg (1 mg Intravenous Given 10/15/16 1357)    Aaron Dodson is 50 y.o. male presenting with Chest pain onset at 3 AM, woke him from sleep at night has been constant since that time. Has a history of cardiovascular disease with multiple stents she had a nonobstructive cath in the last year.  EKG unchanged, troponin negative. Chest x-rays without abnormality.  Cardiology consult appreciated: The recommended obtaining dissection setting of this is negative patient can be discharged home.    This is a shared visit with the attending physician who personally evaluated the patient and agrees with the care plan.   There is been some difficulty in obtaining an IV that can accommodate a contrasted angiogram. Patient has been stuck multiple times to obtain IV access and due to his IV drug use and sclerosis of his veins we have been unable to obtain an IV, he would like to leave the department without obtaining the dissection study. I've advised him that if he leaves the department included risk of death or disability, patient can meaningfully discussed the pros and cons. I've advised him that I would be happy to have the emergency medicine resident obtain ultrasound-guided IV and I've ensured him that we will get the IV on the first try and patient states that he does not want to stay. He is alert, oriented 3, not intoxicated and has capacity for medical decision making. I've advised him he can return to the emergency department at any time. Patient verbalized understanding.   Final Clinical Impressions(s) / ED Diagnoses   Final diagnoses:  Chest pain, unspecified type    New Prescriptions New Prescriptions   No medications on file      Monico Blitz, PA-C 10/15/16 [place], MD 10/15/16 1649

## 2016-10-15 NOTE — ED Provider Notes (Signed)
Complains of anterior chest pressure awaken from sleep 3 AM today. Patient reports that he was aggravated 12 midnight after having an argument with his daughter however he had no chest pressure then. He had similar chest pressure 6 months ago. Presently discomfort is moderate. Nothing makes symptoms better or worse. Symptoms are nonexertional. No shortness of breath nausea or sweatiness. On exam no distress lungs clear auscultation heart regular rate and rhythm abdomen nondistended nontender extremities without edema.   Cardiology service Dr Sallyanne Kuster evaluated pt in the ed and does not feel pain to be of cardiac etiology. Pt refused further diagnostic testing   Orlie Dakin, MD 10/15/16 [phone]

## 2016-10-15 NOTE — ED Triage Notes (Signed)
Had a fight with his 20 last night and he started to have cp , has sob but he smokes,saw his  Cards dr 2 days ago and he cha nged his meds

## 2016-10-15 NOTE — ED Notes (Signed)
Wasted 1.5 mg and 1 mg of dilaudid for both doses with Darliss Ridgel.

## 2016-10-16 ENCOUNTER — Telehealth: Payer: Self-pay | Admitting: Cardiology

## 2016-10-16 ENCOUNTER — Emergency Department (HOSPITAL_COMMUNITY)
Admission: EM | Admit: 2016-10-16 | Discharge: 2016-10-17 | Disposition: A | Discharge status: Home / Self Care | Payer: Medicare Other | Attending: Emergency Medicine | Admitting: Emergency Medicine

## 2016-10-16 ENCOUNTER — Encounter (HOSPITAL_COMMUNITY): Payer: Self-pay

## 2016-10-16 ENCOUNTER — Emergency Department (HOSPITAL_COMMUNITY): Payer: Medicare Other

## 2016-10-16 DIAGNOSIS — Z955 Presence of coronary angioplasty implant and graft: Secondary | ICD-10-CM | POA: Insufficient documentation

## 2016-10-16 DIAGNOSIS — R0789 Other chest pain: Secondary | ICD-10-CM | POA: Diagnosis not present

## 2016-10-16 DIAGNOSIS — I252 Old myocardial infarction: Secondary | ICD-10-CM | POA: Insufficient documentation

## 2016-10-16 DIAGNOSIS — Z8673 Personal history of transient ischemic attack (TIA), and cerebral infarction without residual deficits: Secondary | ICD-10-CM | POA: Insufficient documentation

## 2016-10-16 DIAGNOSIS — I10 Essential (primary) hypertension: Secondary | ICD-10-CM | POA: Insufficient documentation

## 2016-10-16 DIAGNOSIS — Z7982 Long term (current) use of aspirin: Secondary | ICD-10-CM | POA: Diagnosis not present

## 2016-10-16 DIAGNOSIS — F17210 Nicotine dependence, cigarettes, uncomplicated: Secondary | ICD-10-CM | POA: Diagnosis not present

## 2016-10-16 DIAGNOSIS — I2510 Atherosclerotic heart disease of native coronary artery without angina pectoris: Secondary | ICD-10-CM | POA: Insufficient documentation

## 2016-10-16 DIAGNOSIS — R079 Chest pain, unspecified: Secondary | ICD-10-CM | POA: Diagnosis present

## 2016-10-16 LAB — CBC
HCT: 41.7 % (ref 39.0–52.0)
Hemoglobin: 14.7 g/dL (ref 13.0–17.0)
MCH: 33 pg (ref 26.0–34.0)
MCHC: 35.3 g/dL (ref 30.0–36.0)
MCV: 93.7 fL (ref 78.0–100.0)
PLATELETS: 343 10*3/uL (ref 150–400)
RBC: 4.45 MIL/uL (ref 4.22–5.81)
RDW: 13.9 % (ref 11.5–15.5)
WBC: 10.8 10*3/uL — ABNORMAL HIGH (ref 4.0–10.5)

## 2016-10-16 LAB — BASIC METABOLIC PANEL
Anion gap: 10 (ref 5–15)
BUN: 7 mg/dL (ref 6–20)
CALCIUM: 9.5 mg/dL (ref 8.9–10.3)
CO2: 23 mmol/L (ref 22–32)
CREATININE: 1.06 mg/dL (ref 0.61–1.24)
Chloride: 101 mmol/L (ref 101–111)
GFR calc Af Amer: >60 mL/min (ref >60)
GFR calc non Af Amer: >60 mL/min (ref >60)
GLUCOSE: 93 mg/dL (ref 65–99)
Potassium: 3.7 mmol/L (ref 3.5–5.1)
Sodium: 134 mmol/L — ABNORMAL LOW (ref 135–145)

## 2016-10-16 LAB — D-DIMER, QUANTITATIVE: D-Dimer, Quant: 0.36 ug/mL-FEU (ref 0.00–0.50)

## 2016-10-16 LAB — I-STAT TROPONIN, ED: TROPONIN I, POC: 0 ng/mL (ref 0.00–0.08)

## 2016-10-16 MED ORDER — MORPHINE SULFATE (PF) 4 MG/ML IV SOLN
4.0000 mg | Freq: Once | INTRAVENOUS | Status: AC
  Administered 2016-10-16: 4 mg via INTRAVENOUS
  Filled 2016-10-16: qty 1

## 2016-10-16 NOTE — ED Notes (Signed)
Patient refused blood draw at triage. Request blood draw with IV start.

## 2016-10-16 NOTE — ED Provider Notes (Signed)
[facility] DEPT Provider Note   CSN: [id number] Arrival date & time: 10/16/16  2207     History   Chief Complaint Chief Complaint  Patient presents with  . Chest Pain    HPI Aaron Dodson is a 50 y.o. male.  HPI   Patient is a 50 year old male with history of CAD x10 stents (last cath 11/2015, currently on plavix), CVA, hypertension, hyperlipidemia, prior IVDA with endocarditis in 2016, chronic back pain on chronic pain meds, club foot s/p L AKA, ongoing tobacco abuse and anxiety who presents to the ED with complaint of chest pain, onset 2 days. Patient reports 2 nights ago he got into an argument with his daughter at home. He reports waking up at 3 AM the following morning due to having severe mid sternal chest pressure. Patient reports the pain has been constant since onset. He reports taking 3 doses of nitroglycerin at home yesterday without relief. Endorses associated shortness of breath. Denies any aggravating factors. Reports his chest pain is mildly improved when laying on his right side. He reports his chest pain does not feel similar to episodes he has had in the past related to his heart attacks or PE. Patient states he was evaluated in the emergency department yesterday but notes he left AMA prior to receiving his CT scan due to being stuck multiple times to try to obtain IV access. Patient reports he took 2 more doses of nitroglycerin today without relief of his symptoms resulting in him returning to the ED. He notes he called his cardiologist's office and spoke to the on-call physician who advised him to return to the ED to have his CT scan performed. Denies fever, chills, headache, lightheadedness, dizziness, diaphoresis, nasal congestion, rhinorrhea, sore throat, cough, wheezing, palpitations, abdominal pain, nausea, vomiting, leg swelling. Patient reports currently smoking approximately quarter pack per day. He endorses taking all of his home medications as prescribed including  his Plavix.  Past Medical History:  Diagnosis Date  . ADD (attention deficit disorder)   . Anxiety   . Chronic lower back pain   . Club foot   . Coronary artery disease   . Endocarditis    At [facility] 2016  . GERD (gastroesophageal reflux disease)   . Hyperlipidemia   . Hypertension   . Myocardial infarction 2000's - ~ 04/2015   "I've had a total of 3" (11/18/2015)  . PTSD (post-traumatic stress disorder)   . 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 Stroke [facility]) ~ 2006   denies residual on 11/18/2015    Patient Active Problem List   Diagnosis Date Noted  . CAD s/p PCI 11/19/2015  . Anxiety state   . Chest pain 11/18/2015  . HLD (hyperlipidemia) 11/18/2015  . Essential hypertension 11/18/2015  . Tobacco abuse 11/18/2015    Past Surgical History:  Procedure Laterality Date  . ABOVE KNEE LEG AMPUTATION Left   . CARDIAC CATHETERIZATION  "several"  . CARDIAC CATHETERIZATION N/A 11/26/2015   Procedure: Left Heart Cath and Coronary Angiography;  Surgeon: Burnell Blanks, MD;  Location: [facility];  Service: Cardiovascular;  Laterality: N/A;  . CLUB FOOT RELEASE Left 02/27/1967  . CORONARY ANGIOPLASTY  "several"  . CORONARY ANGIOPLASTY WITH STENT PLACEMENT  "several"   "total of 10 stents placed" (11/18/2015)  . LAPAROSCOPIC CHOLECYSTECTOMY    . ORTHOPEDIC SURGERY  1967/06/08-~ 2007   "total of 52 on my left leg; started w/club foot released"  . TONSILLECTOMY  Home Medications    Prior to Admission medications   Medication Sig Start Date End Date Taking? Authorizing Provider  ALPRAZolam Duanne Moron) 1 MG tablet Take 1 mg by mouth 3 (three) times daily as needed for anxiety or sleep.    Yes Historical Provider, MD  aspirin EC 81 MG EC tablet Take 1 tablet (81 mg total) by mouth daily. 07/23/16  Yes Jennifer Chahn-Yang Choi, DO  clopidogrel (PLAVIX) 75 MG tablet Take 1 tablet (75 mg total) by mouth daily. 07/22/16  Yes Jennifer Chahn-Yang Choi,  DO  HYDROcodone-acetaminophen (NORCO) 10-325 MG tablet Take 1 tablet by mouth 4 (four) times daily. scheduled   Yes Historical Provider, MD  ibuprofen (ADVIL,MOTRIN) 200 MG tablet Take 800 mg by mouth every 6 (six) hours as needed (pain).   Yes Historical Provider, MD  metoprolol succinate (TOPROL-XL) 25 MG 24 hr tablet Take 1 tablet (25 mg total) by mouth daily. 10/13/16  Yes Jerline Pain, MD  morphine (MSIR) 15 MG tablet Take 15 mg by mouth 2 (two) times daily. scheduled   Yes Historical Provider, MD  rosuvastatin (CRESTOR) 5 MG tablet Take 5 mg by mouth at bedtime.    Yes Historical Provider, MD  ibuprofen (ADVIL,MOTRIN) 800 MG tablet Take 1 tablet (800 mg total) by mouth every 6 (six) hours as needed for mild pain or moderate pain. Patient not taking: Reported on 10/16/2016 07/22/16   Shon Millet, DO  oxyCODONE-acetaminophen (PERCOCET/ROXICET) 5-325 MG tablet Take 1 tablet by mouth every 4 (four) hours as needed for severe pain. 10/17/16   Nona Dell, PA-C  traMADol (ULTRAM) 50 MG tablet Take 1 tablet (50 mg total) by mouth every 6 (six) hours as needed for severe pain. Patient not taking: Reported on 10/16/2016 09/21/16   Dalia Heading, PA-C    Family History Family History  Problem Relation Age of Onset  . Hypertension Father   . Lung cancer Father     Social History Social History  Substance Use Topics  . Smoking status: Current Every Day Smoker    Packs/day: 1.00    Years: 35.00    Types: Cigarettes  . Smokeless tobacco: Never Used  . Alcohol use No     Allergies   Benadryl [diphenhydramine hcl]; Promethazine hcl; Augmentin [amoxicillin-pot clavulanate]; and Toradol [ketorolac tromethamine]   Review of Systems Review of Systems  Respiratory: Positive for shortness of breath.   Cardiovascular: Positive for chest pain.  All other systems reviewed and are negative.    Physical Exam Updated Vital Signs BP 135/88   Pulse 97   Temp 97.6 F  (36.4 C) (Oral)   Resp 16   SpO2 98%   Physical Exam  Constitutional: He is oriented to person, place, and time. He appears well-developed and well-nourished. No distress.  HENT:  Head: Normocephalic and atraumatic.  Eyes: Conjunctivae and EOM are normal. Right eye exhibits no discharge. Left eye exhibits no discharge. No scleral icterus.  Neck: Normal range of motion.  Cardiovascular: Regular rhythm, normal heart sounds and intact distal pulses.   Tachycardic, HR 111  Pulmonary/Chest: Effort normal and breath sounds normal. No respiratory distress. He has no wheezes. He has no rales. He exhibits tenderness (mild TTP over right anterior chest wall).  Abdominal: Soft. Bowel sounds are normal. He exhibits no distension and no mass. There is no tenderness. There is no rebound and no guarding.  Musculoskeletal: Normal range of motion. He exhibits no edema.  Left AKA  Neurological: He is alert and  oriented to person, place, and time.  Skin: Skin is warm and dry. He is not diaphoretic.  Nursing note and vitals reviewed.    ED Treatments / Results  Labs (all labs ordered are listed, but only abnormal results are displayed) Labs Reviewed  BASIC METABOLIC PANEL - Abnormal; Notable for the following:       Result Value   Sodium 134 (*)    All other components within normal limits  CBC - Abnormal; Notable for the following:    WBC 10.8 (*)    All other components within normal limits  D-DIMER, QUANTITATIVE (NOT AT [facility])  I-STAT TROPOININ, ED    EKG  EKG Interpretation  Date/Time:  Saturday October 16 2016 22:15:51 EST Ventricular Rate:  114 PR Interval:  170 QRS Duration: 84 QT Interval:  318 QTC Calculation: 438 R Axis:   69 Text Interpretation:  Sinus tachycardia Otherwise normal ECG No significant change since last tracing Confirmed by WARD,  DO, KRISTEN [id number]) on 10/17/2016 12:00:58 AM       Radiology Dg Chest 2 View  Result Date: 10/16/2016 CLINICAL DATA:  Chest  pain and shortness of breath. Chest pressure. Symptoms for 2 days. EXAM: CHEST  2 VIEW COMPARISON:  Radiographs yesterday. FINDINGS: No change from prior exam. Stable heart size and mediastinal contours. Coronary stents are seen. Mild hyperinflation is unchanged. No focal airspace disease, pulmonary edema, pleural fluid or pneumothorax. Unchanged osseous structures. IMPRESSION: No acute abnormality or change from prior exam. Electronically Signed   By: Jeb Levering M.D.   On: 10/16/2016 23:47   Dg Chest 2 View  Result Date: 10/15/2016 CLINICAL DATA:  Chest pressure EXAM: CHEST  2 VIEW COMPARISON:  September 21, 2016 FINDINGS: The heart size and mediastinal contours are within normal limits. Both lungs are clear. The visualized skeletal structures are unremarkable. IMPRESSION: No active cardiopulmonary disease. Electronically Signed   By: Dorise Bullion III M.D   On: 10/15/2016 13:05   Ct Angio Chest/abd/pel For Dissection W And/or Wo Contrast  Result Date: 10/17/2016 CLINICAL DATA:  Chest pain. Concern for pulmonary embolus or dissection. EXAM: CT ANGIOGRAPHY CHEST, ABDOMEN AND PELVIS TECHNIQUE: Multidetector CT imaging through the chest, abdomen and pelvis was performed using the standard protocol during bolus administration of intravenous contrast. Multiplanar reconstructed images and MIPs were obtained and reviewed to evaluate the vascular anatomy. CONTRAST:  100 cc Isovue 370 IV COMPARISON:  Multiple prior radiographs most recently yesterday. Chest CT 11/22/2015 FINDINGS: CTA CHEST FINDINGS Cardiovascular: No aortic dissection, aneurysm, acute aortic abnormality or hematoma. Trace atherosclerosis. No filling defects within the pulmonary arteries to the segmental level. Coronary artery stents are seen. Normal heart size. No pericardial effusion. Mediastinum/Nodes: Small mediastinal lymph nodes not enlarged by size criteria. No hilar adenopathy. The esophagus is decompressed. Visualized thyroid gland  is normal. Lungs/Pleura: Apical predominant emphysema. Central bronchial thickening which appears chronic. Small focal nodular opacity in the right lower lobe series 11, image 88 measures 10 x 8 mm. This abuts the paravertebral pleural surface in has irregular margins. There is focal atelectasis in this region on prior exam. Minimal dependent atelectasis in both lower lobes. No pleural fluid. Musculoskeletal: There are no acute or suspicious osseous abnormalities. Review of the MIP images confirms the above findings. CTA ABDOMEN AND PELVIS FINDINGS VASCULAR Aorta: Normal in caliber. Moderate atherosclerosis. No aneurysm, dissection, or inflammatory change. Celiac: Patent without evidence of aneurysm, dissection, vasculitis or significant stenosis. SMA: Patent without evidence of aneurysm, dissection, vasculitis or significant stenosis.  There is a replaced right hepatic artery, incidentally noted. Renals: Both renal arteries are patent without evidence of aneurysm, dissection, vasculitis, fibromuscular dysplasia or significant stenosis. IMA: Patent without evidence of aneurysm, dissection, vasculitis or significant stenosis. Inflow: Moderate atherosclerosis with less than 50% luminal narrowing bilaterally, left greater than right. No dissection or acute inflammation. Veins: No obvious venous abnormality within the limitations of this arterial phase study. Review of the MIP images confirms the above findings. NON-VASCULAR Hepatobiliary: No focal hepatic lesion on arterial phase imaging. Postcholecystectomy with biliary prominence, common bile duct measures 11 mm at the porta hepatis, a common finding post cholecystectomy. No calcified choledocholithiasis. Pancreas: Unremarkable. No pancreatic ductal dilatation or surrounding inflammatory changes. Spleen: Normal arterial phase enhancement.  Normal in size. Adrenals/Urinary Tract: No adrenal nodule. Kidneys are symmetric in size with homogeneous enhancement.  Nonobstructing 4 mm stone in the lower left kidney. No hydronephrosis. Physiologic bladder distention. Stomach/Bowel: Small hiatal hernia. No bowel dilatation or inflammation. Normal appendix. Lymphatic: Small porta hepatis node. No retroperitoneal or pelvic adenopathy. Reproductive: Prostate is unremarkable. Other: No free air, free fluid, or intra-abdominal fluid collection. Musculoskeletal: No acute or significant osseous findings. Review of the MIP images confirms the above findings. IMPRESSION: 1. No aortic dissection. No acute aortic abnormality. No pulmonary embolus to the segmental level. 2. No acute abnormality in the chest abdomen or pelvis. 3. Small focal nodular opacity in the right lower lobe, average size 9 mm. Unclear whether this represents atelectasis or a pulmonary nodule. Consider one of the following in 3 months for both low-risk and high-risk individuals: (a) repeat chest CT, (b) follow-up PET-CT, or (c) tissue sampling. This recommendation follows the consensus statement: Guidelines for Management of Incidental Pulmonary Nodules Detected on CT Images: From the Fleischner Society 2017; Radiology 2017; 284:228-243. 4. Emphysema with chronic bronchial thickening. Nonobstructing left renal stone. Electronically Signed   By: Jeb Levering M.D.   On: 10/17/2016 01:32    Procedures Procedures (including critical care time)  Medications Ordered in ED Medications  morphine 4 MG/ML injection 4 mg (4 mg Intravenous Given 10/16/16 2355)  iopamidol (ISOVUE-370) 76 % injection (100 mLs  Contrast Given 10/17/16 0037)  HYDROmorphone (DILAUDID) injection 0.5 mg (0.5 mg Intravenous Given 10/17/16 0158)     Initial Impression / Assessment and Plan / ED Course  I have reviewed the triage vital signs and the nursing notes.  Pertinent labs & imaging results that were available during my care of the patient were reviewed by me and considered in my medical decision making (see chart for  details).  Clinical Course     Patient presents with constant chest pain with associated shortness of breath for the past 2 days. He reports being evaluated in the ED yesterday for his symptoms but reports leaving due to being tired of being stuck multiple times to establish IV access. Patient reports having continued symptoms at home. Patient with extensive cardiac history including placement of 10 stents. Patient reports being compliant with his home medications including Plavix. Patient reports his chest pain does not feel consistent with his prior MI. VSS. Exam revealed tenderness to palpation over right anterior chest wall. Remaining exam unremarkable. Patient given pain meds.  Chart review shows patient was seen by his cardiologist on 12/6. Echo on 11/26/15 showed normal systolic function, EF [id number], mild AV regurgitation. Pt with hx of atypical CP, thought to be musculoskeletal in etiology. Last cath performed on 11/2015 showed patent stents, nonobstructive CAD, nml EF. Plan to continue ASA  and plavix, switched from bystolic to metoprolol and follow up in 6 months. Patient was then seen in the ED on 12/8 for chest pain. EKG unchanged, negative troponin, chest x-ray negative. Cardiology was consulted and recommended obtaining dissection study. If workup is negative, will schedule for an outpatient Lexiscan Myoview next week. Patient left AMA due to getting frustrated after being stuck multiple times to obtain IV access for CT.  EKG showed sinus tachycardia, heart rate 114. Troponin negative. D-dimer negative. Chest x-ray negative. CT dissection study ordered for further evaluation. CT negative for dissection, PE or acute changes. Discussed patient with Dr. Leonides Schanz who also evaluated patient in the ED. I have a low suspicion for ACS, PE, dissection, or other acute cardiac event at this time. Suspect patient's pain may likely be due to musculoskeletal chest wall pain. On reevaluation patient reports mild  improvement of pain. Discussed results and plan for discharge with patient. Advised patient to call his cardiologist Monday morning to schedule a follow-up appointment this week. Discussed return precautions.    Final Clinical Impressions(s) / ED Diagnoses   Final diagnoses:  Chest pain, unspecified type    New Prescriptions Discharge Medication List as of 10/17/2016  2:19 AM    START taking these medications   Details  oxyCODONE-acetaminophen (PERCOCET/ROXICET) 5-325 MG tablet Take 1 tablet by mouth every 4 (four) hours as needed for severe pain., Starting Sun 10/17/2016, Print         Nona Dell, PA-C 10/17/16 [phone]

## 2016-10-16 NOTE — Telephone Encounter (Signed)
Patient called back to phone where I initially called from.  He reported that he continues to have the CP that he presented to ER with yesterday, and wanted to call before coming to the hospital note.   Per Aaron Dodson consult note from Er visit yesterday:  "Strongly doubt coronary etiology for symptoms, even taking into account his rich coronary history. Risk of major acute coronary event is low. Consider pulmonary embolism or aortic pathology, although the CXR does not support either one. If workup is negative, will schedule for an outpatient Lexiscan Myoview next week."  Aaron Dodson had become irritated by failed attempts to get a PIV for CT scan and left ER after being seen by Dr. Sallyanne Kuster.  Based on Aaron Dodson assessment (similar to primary cardiologist Dr. Kingsley Plan assessment), risk for cardiac etiology is low.  This was discussed with the patient. The patient feels that a PE is unlikely because he is not short of breath at all, and he had SOB when he had a PE before. I offered that even if a PE or aortic dissection is low probability, they are life threatening diagnoses worth ruling out if chest pain persists.

## 2016-10-16 NOTE — Telephone Encounter (Signed)
Paged regarding continued chest pain.  I called the patient x 2 without any response.  Per chart review, he has been evaluated several times recently for chest pain which is believed to be non-cardiac.

## 2016-10-17 ENCOUNTER — Emergency Department (HOSPITAL_COMMUNITY): Payer: Medicare Other

## 2016-10-17 ENCOUNTER — Encounter (HOSPITAL_COMMUNITY): Payer: Self-pay | Admitting: Radiology

## 2016-10-17 DIAGNOSIS — R0789 Other chest pain: Secondary | ICD-10-CM | POA: Diagnosis not present

## 2016-10-17 MED ORDER — OXYCODONE-ACETAMINOPHEN 5-325 MG PO TABS: 1.0000 | ORAL_TABLET | ORAL | 0 refills | Status: DC | PRN

## 2016-10-17 MED ORDER — HYDROMORPHONE HCL 2 MG/ML IJ SOLN
0.5000 mg | Freq: Once | INTRAMUSCULAR | Status: AC
  Administered 2016-10-17: 0.5 mg via INTRAVENOUS
  Filled 2016-10-17: qty 1

## 2016-10-17 MED ORDER — IOPAMIDOL (ISOVUE-370) INJECTION 76%
INTRAVENOUS | Status: AC
  Administered 2016-10-17: 100 mL
  Filled 2016-10-17: qty 100

## 2016-10-17 NOTE — ED Provider Notes (Signed)
Medical screening examination/treatment/procedure(s) were conducted as a shared visit with non-physician practitioner(s) and myself.  I personally evaluated the patient during the encounter.   EKG Interpretation  Date/Time:  Saturday October 16 2016 22:15:51 EST Ventricular Rate:  114 PR Interval:  170 QRS Duration: 84 QT Interval:  318 QTC Calculation: 438 R Axis:   69 Text Interpretation:  Sinus tachycardia Otherwise normal ECG No significant change since last tracing Confirmed by Tyjai Matuszak,  DO, Jhalil Silvera [phone]) on 10/17/2016 12:00:58 AM      Pt is a 50 y.o. male with significant coronary artery disease history status post multiple stents who presents emergency department with central to left-sided chest pressure that is worse with movement and palpation. Seen by cardiology yesterday while in the emergency department and they advise CT scan to rule out dissection, PE. Troponin at that time negative. Plan was to discharge home if CT scan negative with outpatient stress test. They thought low suspicion for coronary artery disease. Patient left AGAINST MEDICAL ADVICE because they were unable to obtain appropriate sized IV for the CT scan. Call his cardiologist today who recommended he return the emergency department. Here his troponin is negative. D-dimer also negative. Chest x-ray clear. Pain seems atypical, possible early musculoskeletal. Will obtain a CT angiogram rule out dissection. Will treat pain with narcotics. Plan is to discharge home with close outpatient cardio to follow-up if workup negative. He is comfortable with this plan.   [place], DO 10/17/16 0010

## 2016-10-17 NOTE — ED Notes (Signed)
Pt in CT at this time.

## 2016-10-17 NOTE — Discharge Instructions (Signed)
Take your medication as prescribed as needed for pain relief. I recommend continuing to take your home medications as prescribed. Call your cardiologist's office on Monday to schedule a follow-up appointment for next week for follow-up evaluation and continued management of your chest pain. Please return to the Emergency Department if symptoms worsen or new onset of fever, difficulty breathing, new/worsening chest pain, heart palpitations, coughing up blood, abdominal pain, vomiting, numbness, tingling, weakness.

## 2016-11-08 ENCOUNTER — Observation Stay (HOSPITAL_COMMUNITY)
Admission: EM | Admit: 2016-11-08 | Discharge: 2016-11-09 | Disposition: A | Discharge status: Home / Self Care | Payer: Medicare HMO | Attending: Internal Medicine | Admitting: Internal Medicine

## 2016-11-08 ENCOUNTER — Emergency Department (HOSPITAL_COMMUNITY): Payer: Medicare HMO

## 2016-11-08 ENCOUNTER — Encounter (HOSPITAL_COMMUNITY): Payer: Self-pay | Admitting: Emergency Medicine

## 2016-11-08 DIAGNOSIS — I10 Essential (primary) hypertension: Secondary | ICD-10-CM | POA: Diagnosis not present

## 2016-11-08 DIAGNOSIS — Z79899 Other long term (current) drug therapy: Secondary | ICD-10-CM | POA: Insufficient documentation

## 2016-11-08 DIAGNOSIS — I4891 Unspecified atrial fibrillation: Secondary | ICD-10-CM | POA: Diagnosis not present

## 2016-11-08 DIAGNOSIS — I2510 Atherosclerotic heart disease of native coronary artery without angina pectoris: Secondary | ICD-10-CM | POA: Diagnosis not present

## 2016-11-08 DIAGNOSIS — R079 Chest pain, unspecified: Secondary | ICD-10-CM | POA: Diagnosis present

## 2016-11-08 DIAGNOSIS — I252 Old myocardial infarction: Secondary | ICD-10-CM | POA: Insufficient documentation

## 2016-11-08 DIAGNOSIS — Z8673 Personal history of transient ischemic attack (TIA), and cerebral infarction without residual deficits: Secondary | ICD-10-CM | POA: Diagnosis not present

## 2016-11-08 DIAGNOSIS — F17210 Nicotine dependence, cigarettes, uncomplicated: Secondary | ICD-10-CM | POA: Diagnosis not present

## 2016-11-08 DIAGNOSIS — R000 Tachycardia, unspecified: Secondary | ICD-10-CM

## 2016-11-08 DIAGNOSIS — R0789 Other chest pain: Secondary | ICD-10-CM | POA: Diagnosis present

## 2016-11-08 DIAGNOSIS — Z955 Presence of coronary angioplasty implant and graft: Secondary | ICD-10-CM | POA: Insufficient documentation

## 2016-11-08 DIAGNOSIS — Z7982 Long term (current) use of aspirin: Secondary | ICD-10-CM | POA: Diagnosis not present

## 2016-11-08 DIAGNOSIS — R072 Precordial pain: Secondary | ICD-10-CM

## 2016-11-08 HISTORY — DX: Chronic obstructive pulmonary disease, unspecified: J44.9

## 2016-11-08 HISTORY — DX: Other pulmonary embolism without acute cor pulmonale: I26.99

## 2016-11-08 HISTORY — DX: Paroxysmal atrial fibrillation: I48.0

## 2016-11-08 HISTORY — DX: Squamous cell carcinoma of skin of scalp and neck: C44.42

## 2016-11-08 LAB — RAPID URINE DRUG SCREEN, HOSP PERFORMED
Amphetamines: NOT DETECTED
BARBITURATES: NOT DETECTED
BENZODIAZEPINES: POSITIVE — AB
COCAINE: NOT DETECTED
Opiates: POSITIVE — AB
TETRAHYDROCANNABINOL: NOT DETECTED

## 2016-11-08 LAB — MRSA PCR SCREENING: MRSA by PCR: POSITIVE — AB

## 2016-11-08 LAB — CBC WITH DIFFERENTIAL/PLATELET
BASOS ABS: 0 10*3/uL (ref 0.0–0.1)
Basophils Relative: 1 %
Eosinophils Absolute: 0.3 10*3/uL (ref 0.0–0.7)
Eosinophils Relative: 3 %
HEMATOCRIT: 40.1 % (ref 39.0–52.0)
Hemoglobin: 14.1 g/dL (ref 13.0–17.0)
Lymphocytes Relative: 37 %
Lymphs Abs: 3.1 10*3/uL (ref 0.7–4.0)
MCH: 32.9 pg (ref 26.0–34.0)
MCHC: 35.2 g/dL (ref 30.0–36.0)
MCV: 93.5 fL (ref 78.0–100.0)
MONO ABS: 0.7 10*3/uL (ref 0.1–1.0)
MONOS PCT: 9 %
NEUTROS ABS: 4.2 10*3/uL (ref 1.7–7.7)
Neutrophils Relative %: 50 %
Platelets: 302 10*3/uL (ref 150–400)
RBC: 4.29 MIL/uL (ref 4.22–5.81)
RDW: 13.9 % (ref 11.5–15.5)
WBC: 8.3 10*3/uL (ref 4.0–10.5)

## 2016-11-08 LAB — BASIC METABOLIC PANEL
Anion gap: 4 — ABNORMAL LOW (ref 5–15)
BUN: 7 mg/dL (ref 6–20)
CHLORIDE: 107 mmol/L (ref 101–111)
CO2: 27 mmol/L (ref 22–32)
CREATININE: 1.15 mg/dL (ref 0.61–1.24)
Calcium: 9.3 mg/dL (ref 8.9–10.3)
GFR calc Af Amer: >60 mL/min (ref >60)
GFR calc non Af Amer: >60 mL/min (ref >60)
GLUCOSE: 108 mg/dL — AB (ref 65–99)
Potassium: 3.7 mmol/L (ref 3.5–5.1)
Sodium: 138 mmol/L (ref 135–145)

## 2016-11-08 LAB — TROPONIN I
Troponin I: <0.03 ng/mL (ref <0.03)
Troponin I: <0.03 ng/mL (ref <0.03)

## 2016-11-08 LAB — I-STAT TROPONIN, ED: Troponin i, poc: 0 ng/mL (ref 0.00–0.08)

## 2016-11-08 MED ORDER — SODIUM CHLORIDE 0.9% FLUSH
3.0000 mL | Freq: Two times a day (BID) | INTRAVENOUS | Status: DC
  Administered 2016-11-08 – 2016-11-09 (×3): 3 mL via INTRAVENOUS

## 2016-11-08 MED ORDER — METOPROLOL SUCCINATE ER 50 MG PO TB24
50.0000 mg | ORAL_TABLET | Freq: Every day | ORAL | Status: DC
  Administered 2016-11-08: 50 mg via ORAL
  Filled 2016-11-08 (×2): qty 1

## 2016-11-08 MED ORDER — ASPIRIN EC 81 MG PO TBEC
81.0000 mg | DELAYED_RELEASE_TABLET | Freq: Every day | ORAL | Status: DC
  Administered 2016-11-08: 81 mg via ORAL
  Filled 2016-11-08: qty 1

## 2016-11-08 MED ORDER — MORPHINE SULFATE (PF) 4 MG/ML IV SOLN
4.0000 mg | Freq: Once | INTRAVENOUS | Status: AC
  Administered 2016-11-08: 4 mg via INTRAVENOUS
  Filled 2016-11-08: qty 1

## 2016-11-08 MED ORDER — ALPRAZOLAM 0.5 MG PO TABS: 1.0000 mg | ORAL_TABLET | Freq: Three times a day (TID) | ORAL | Status: DC | PRN

## 2016-11-08 MED ORDER — ONDANSETRON HCL 4 MG/2ML IJ SOLN: 4.0000 mg | Freq: Four times a day (QID) | INTRAMUSCULAR | Status: DC | PRN

## 2016-11-08 MED ORDER — DOCUSATE SODIUM 100 MG PO CAPS
100.0000 mg | ORAL_CAPSULE | Freq: Two times a day (BID) | ORAL | Status: DC
  Administered 2016-11-08 – 2016-11-09 (×2): 100 mg via ORAL
  Filled 2016-11-08 (×3): qty 1

## 2016-11-08 MED ORDER — ALPRAZOLAM 0.5 MG PO TABS
0.5000 mg | ORAL_TABLET | Freq: Two times a day (BID) | ORAL | Status: DC | PRN
  Administered 2016-11-08 – 2016-11-09 (×3): 0.5 mg via ORAL
  Filled 2016-11-08 (×3): qty 1

## 2016-11-08 MED ORDER — MUPIROCIN 2 % EX OINT
1.0000 "application " | TOPICAL_OINTMENT | Freq: Two times a day (BID) | CUTANEOUS | Status: DC
  Administered 2016-11-08 – 2016-11-09 (×2): 1 via NASAL
  Filled 2016-11-08: qty 22

## 2016-11-08 MED ORDER — CHLORHEXIDINE GLUCONATE CLOTH 2 % EX PADS
6.0000 | MEDICATED_PAD | Freq: Every day | CUTANEOUS | Status: DC
  Administered 2016-11-09: 6 via TOPICAL

## 2016-11-08 MED ORDER — APIXABAN 5 MG PO TABS
5.0000 mg | ORAL_TABLET | Freq: Two times a day (BID) | ORAL | Status: DC
  Administered 2016-11-08 (×2): 5 mg via ORAL
  Filled 2016-11-08 (×2): qty 1

## 2016-11-08 MED ORDER — CLOPIDOGREL BISULFATE 75 MG PO TABS
75.0000 mg | ORAL_TABLET | Freq: Every day | ORAL | Status: DC
  Administered 2016-11-08 – 2016-11-09 (×2): 75 mg via ORAL
  Filled 2016-11-08 (×2): qty 1

## 2016-11-08 MED ORDER — DICLOFENAC SODIUM 1 % TD GEL
4.0000 g | Freq: Four times a day (QID) | TRANSDERMAL | Status: DC
  Administered 2016-11-08 – 2016-11-09 (×5): 4 g via TOPICAL
  Filled 2016-11-08: qty 100

## 2016-11-08 MED ORDER — ACETAMINOPHEN 650 MG RE SUPP: 650.0000 mg | Freq: Four times a day (QID) | RECTAL | Status: DC | PRN

## 2016-11-08 MED ORDER — HYDROCODONE-ACETAMINOPHEN 10-325 MG PO TABS
1.0000 | ORAL_TABLET | Freq: Four times a day (QID) | ORAL | Status: DC
  Administered 2016-11-08 – 2016-11-09 (×5): 1 via ORAL
  Filled 2016-11-08 (×5): qty 1

## 2016-11-08 MED ORDER — SENNA 8.6 MG PO TABS
1.0000 | ORAL_TABLET | Freq: Two times a day (BID) | ORAL | Status: DC
  Administered 2016-11-09: 8.6 mg via ORAL
  Filled 2016-11-08 (×2): qty 1

## 2016-11-08 MED ORDER — ONDANSETRON HCL 4 MG PO TABS: 4.0000 mg | ORAL_TABLET | Freq: Four times a day (QID) | ORAL | Status: DC | PRN

## 2016-11-08 MED ORDER — ROSUVASTATIN CALCIUM 10 MG PO TABS
20.0000 mg | ORAL_TABLET | Freq: Every day | ORAL | Status: DC
  Administered 2016-11-08: 20 mg via ORAL
  Filled 2016-11-08: qty 2

## 2016-11-08 MED ORDER — NITROGLYCERIN 2 % TD OINT
0.5000 [in_us] | TOPICAL_OINTMENT | Freq: Four times a day (QID) | TRANSDERMAL | Status: DC | PRN
  Administered 2016-11-08: 0.5 [in_us] via TOPICAL
  Filled 2016-11-08: qty 30

## 2016-11-08 MED ORDER — MORPHINE SULFATE (PF) 4 MG/ML IV SOLN
4.0000 mg | Freq: Once | INTRAVENOUS | Status: AC
Start: 2016-11-08 — End: 2016-11-08
  Administered 2016-11-08: 4 mg via INTRAVENOUS
  Filled 2016-11-08: qty 1

## 2016-11-08 MED ORDER — NICOTINE 14 MG/24HR TD PT24
14.0000 mg | MEDICATED_PATCH | Freq: Every day | TRANSDERMAL | Status: DC
  Administered 2016-11-08 – 2016-11-09 (×2): 14 mg via TRANSDERMAL
  Filled 2016-11-08 (×2): qty 1

## 2016-11-08 MED ORDER — ACETAMINOPHEN 325 MG PO TABS
650.0000 mg | ORAL_TABLET | Freq: Four times a day (QID) | ORAL | Status: DC | PRN
  Administered 2016-11-08: 650 mg via ORAL
  Filled 2016-11-08: qty 2

## 2016-11-08 NOTE — ED Notes (Signed)
Pt ambulated to and from the restroom with no difficulty.

## 2016-11-08 NOTE — Consult Note (Signed)
Cardiology Consult Note   Patient ID: Aaron Dodson, MRN: [id number], DOB: 05/05/1966   Date of Encounter: 11/08/2016, 11:19 AM  Primary Care Provider: Sandi Mariscal, MD Cardiologist: Dr. Candee Furbish  Electrophysiologist:  N/a  Referring: Dr. Marsa Aris in the ED at [facility]  Chief Complaint:  Chest Pain  History of Present Illness: Aaron Dodson is a 50 y.o. male tobacco smoker with a hx of CAD s/p remote PCI with stenting [place] in the past (LAD, OM1 and RCA stents).  Last cardiac cath was done here at [facility] in 11/2015 after multiple presentations to the ED for chest pain.  LHC revealed nonobstructive CAD and medical therapy was recommended.  He has since been incarcerated for several months and returned to the hospital twice with recurrent chest pain.  In 06/2016 he left AMA.  His other hx includes HTN, HL, prior stroke, prior IV drug abuse and endocarditis in 2016, chronic low back pain (takes opioids), anxiety and reported atrial fibrillation (although this has not been documented).  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     Last seen in clinic by Dr. Candee Furbish 10/13/16.  He then saw Dr. Dani Gobble Croitoru in the ED for chest pain.  CTA was neg for pulmonary embolism.  OP follow up with a stress test was recommended.  He has not had a stress test set up yet.  Today, he presented to the ED with chest pain that started last night around 10 pm.  He states that he has an episode of atrial fibrillation 2-3 times a month.  These only last a few hours.  This episode lasted 2-3 days.  Since then, he has noted chest pain that has been intermittent.  His symptoms became worse last night and have been constant.  He notes assoc shortness of breath, L axillary pain and back pain and +/- diaphoresis.  He denies orthopnea, PND, edema, syncope.  His pain is currently "7/10."  Prior Cardiac Studies:  Echo 11/26/15 EF 50-55, no RWMA, Gr 1 DD, mild  AI  LHC 11/26/15 LM 30 LAD mid 100, dist stent patent LCx ostial 40, proximal stent patent with 50-70 beyond stent (jailed); OM1 stent patent RCA ostial stent patent with 20 ISR EF 55-65 Conclusion: 1. Triple vessel CAD 2. The LAD has mild plaque in the proximal segment and a patent stent in the distal/apical segment without restenosis.  3. The Circumflex has an ostial stent that extends into OM1. There is 40% ostial narrowing within the stent. The continuation of the AV groove Circumflex beyond OM1 is jailed by the stent with 50-70% stenosis. This continuation branch of the AV groove Circumflex is small in caliber.  4. The RCA has patent stents extending from the ostium of the vessel down into the PDA. There is diffuse mild stent restenosis.  5. The left main has a distal 30% stenosis.  6. Normal LV systolic function Recommendations: Continue medical management of CAD. He can be discharged tonight after bedrest.   Past Medical History:  Diagnosis Date  . ADD (attention deficit disorder)   . Anxiety   . Chronic lower back pain   . Club foot   . Coronary artery disease   . Endocarditis    At [facility] 2016  . GERD (gastroesophageal reflux disease)   . Hyperlipidemia   . Hypertension   . Myocardial infarction 2000's - ~ 04/2015   "I've had a total of  3" (11/18/2015)  . PTSD (post-traumatic stress disorder)   . 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 Stroke [facility]) ~ 2006   denies residual on 11/18/2015     Past Surgical History:  Procedure Laterality Date  . ABOVE KNEE LEG AMPUTATION Left   . CARDIAC CATHETERIZATION  "several"  . CARDIAC CATHETERIZATION N/A 11/26/2015   Procedure: Left Heart Cath and Coronary Angiography;  Surgeon: Burnell Blanks, MD;  Location: [facility];  Service: Cardiovascular;  Laterality: N/A;  . CLUB FOOT RELEASE Left 1967-08-25  . CORONARY ANGIOPLASTY  "several"  . CORONARY ANGIOPLASTY WITH STENT PLACEMENT  "several"    "total of 10 stents placed" (11/18/2015)  . LAPAROSCOPIC CHOLECYSTECTOMY    . ORTHOPEDIC SURGERY  21-Dec-1966-~ 2007   "total of 52 on my left leg; started w/club foot released"  . TONSILLECTOMY        Prior to Admission medications   Medication Sig Start Date End Date Taking? Authorizing Provider  ALPRAZolam Duanne Moron) 1 MG tablet Take 1 mg by mouth 3 (three) times daily as needed for anxiety or sleep.    Yes Historical Provider, MD  apixaban (ELIQUIS) 5 MG TABS tablet Take 5 mg by mouth 2 (two) times daily.   Yes Historical Provider, MD  aspirin EC 81 MG EC tablet Take 1 tablet (81 mg total) by mouth daily. 07/23/16  Yes Jennifer Chahn-Yang Choi, DO  clopidogrel (PLAVIX) 75 MG tablet Take 1 tablet (75 mg total) by mouth daily. 07/22/16  Yes Jennifer Chahn-Yang Choi, DO  HYDROcodone-acetaminophen (NORCO) 10-325 MG tablet Take 1 tablet by mouth 4 (four) times daily. scheduled   Yes Historical Provider, MD  ibuprofen (ADVIL,MOTRIN) 200 MG tablet Take 800 mg by mouth every 6 (six) hours as needed (pain).   Yes Historical Provider, MD  metoprolol succinate (TOPROL-XL) 25 MG 24 hr tablet Take 1 tablet (25 mg total) by mouth daily. 10/13/16  Yes Jerline Pain, MD  morphine (MSIR) 15 MG tablet Take 15 mg by mouth 2 (two) times daily. scheduled   Yes Historical Provider, MD  oxyCODONE-acetaminophen (PERCOCET/ROXICET) 5-325 MG tablet Take 1 tablet by mouth every 4 (four) hours as needed for severe pain. 10/17/16  Yes Nona Dell, PA-C  rosuvastatin (CRESTOR) 5 MG tablet Take 5 mg by mouth at bedtime.    Yes Historical Provider, MD  ibuprofen (ADVIL,MOTRIN) 800 MG tablet Take 1 tablet (800 mg total) by mouth every 6 (six) hours as needed for mild pain or moderate pain. Patient not taking: Reported on 11/08/2016 07/22/16   Shon Millet, DO  traMADol (ULTRAM) 50 MG tablet Take 1 tablet (50 mg total) by mouth every 6 (six) hours as needed for severe pain. Patient not taking: Reported on  11/08/2016 09/21/16   Dalia Heading, PA-C      Allergies: Allergies  Allergen Reactions  . Benadryl [Diphenhydramine Hcl] Other (See Comments)    Aggressive behavior  . Promethazine Hcl Other (See Comments)    Unknown - given for surgical procedure and advised to never take again  . Augmentin [Amoxicillin-Pot Clavulanate] Diarrhea and Nausea Only  . Toradol [Ketorolac Tromethamine] Hives and Other (See Comments)    Redness. Patient states he tolerated ibuprofen fine.      Social History:  The patient  reports that he has been smoking Cigarettes.  He has a 35.00 pack-year smoking history. He has never used smokeless tobacco. He reports that he does not drink alcohol or use  drugs.   Family History:  The patient's family history includes Hypertension in his father; Lung cancer in his father.   ROS:  Please see the history of present illness.  He denies fever, cough, melena, hematochezia, hematuria.   All other systems reviewed and negative.   Vital Signs: Blood pressure 130/95, pulse 62, temperature 97.7 F (36.5 C), temperature source Oral, resp. rate 18, height 6' (1.829 m), weight 202 lb (91.6 kg), SpO2 99 %.  PHYSICAL EXAM: General:  Well nourished, well developed, in no acute distress  HEENT: normal Lymph: no adenopathy Neck: no JVD Endocrine:  No thryomegaly Vascular: No carotid bruits  Cardiac:  normal S1, S2; RRR; no murmur  Lungs:  clear to auscultation bilaterally, no wheezing, rhonchi or rales  Abd: soft, nontender  Ext: no edema in R leg Musculoskeletal:  L AKA - prosthesis in place Skin: warm and dry  Neuro:  CNs 2-12 intact, no focal abnormalities noted Psych:  Normal affect   EKG:  NSR, HR 91, normal axis, NSSTTW changes, no significant change since last tracing.   Labs:   Lab Results  Component Value Date   WBC 8.3 11/08/2016   HGB 14.1 11/08/2016   HCT 40.1 11/08/2016   MCV 93.5 11/08/2016   PLT 302 11/08/2016     Recent Labs Lab  11/08/16 0837  NA 138  K 3.7  CL 107  CO2 27  BUN 7  CREATININE 1.15  CALCIUM 9.3  GLUCOSE 108*    No results for input(s): CKTOTAL, CKMB, TROPONINI in the last 72 hours.    Recent Labs  11/08/16 0839  TROPIPOC 0.00   No results found for: CHOL, HDL, LDLCALC, TRIG   Radiology/Studies:   Dg Chest 2 View  Result Date: 11/08/2016 IMPRESSION: No active cardiopulmonary disease. Electronically Signed   By: Lorriane Shire M.D.   On: 11/08/2016 09:07   Ct Angio Chest/abd/pel For Dissection W And/or Wo Contrast  Result Date: 10/17/2016 IMPRESSION: 1. No aortic dissection. No acute aortic abnormality. No pulmonary embolus to the segmental level. 2. No acute abnormality in the chest abdomen or pelvis. 3. Small focal nodular opacity in the right lower lobe, average size 9 mm. Unclear whether this represents atelectasis or a pulmonary nodule. Consider one of the following in 3 months for both low-risk and high-risk individuals: (a) repeat chest CT, (b) follow-up PET-CT, or (c) tissue sampling. This recommendation follows the consensus statement: Guidelines for Management of Incidental Pulmonary Nodules Detected on CT Images: From the Fleischner Society 2017; Radiology 2017; 284:228-243. 4. Emphysema with chronic bronchial thickening. Nonobstructing left renal stone. Electronically Signed   By: Jeb Levering M.D.   On: 10/17/2016 01:32    ASSESSMENT AND PLAN:   Patient profile:  51 yo male with hx of CAD and multiple PCIs in the past, parox AF, prior stroke, chronic pain, HTN, HL, tobacco abuse, prior IV drug abuse and endocarditis.  His last LHC in 1/17 demonstrated patent stents and non-obstructive CAD.  He is on Eliquis, Plavix, ASA.  He presents with chest pain since 10 pm last night that was not responsive to NTG.  Initial POC Troponin is normal.  1. Chest pain - Patient presents with prolonged chest pain and neg enzymes so far.  With hours of chest pain and a neg Troponin, it is  unlikely that his symptoms are related to cardiac ischemia.  However, he has symptoms that remind of his prior angina.  He will need serial cardiac enzymes.  -  Continue ASA, Plavix, statin, beta-blocker   -  He is on Eliquis and does not need Heparin.  -  Increase Toprol-XL to 50 mg QD  -  Check serial Troponin levels  -  Get echocardiogram   -  If Troponin levels neg, plan inpatient pharmacologic nuclear stress test tomorrow.  -  If Troponin levels elevated, he will need cardiac cath  -  Consider DC ASA if workup neg (does not need dual antiplatelet Rx + anticoagulation)  2. CAD - Hx of multivessel PCI.  Last LHC in 1/17 with non-obs CAD and patent stents.  He now presents with > 8 hours of chest pain and neg Troponin x 1.  ECG is unchanged.  Follow enzymes.  Obtain echocardiogram as noted.  Proceed with stress test tomorrow if he r/o; cardiac cath if he rules in for myocardial infarction.    3. HTN - BP elevated.  Adjust beta-blocker as noted.   4. HL - Continue Crestor.  With hx of extensive CAD, he should be on mod to high intensity statin.  His Crestor should be increased to 20 mg QD.  5. Tobacco abuse - He states today is his quit date.  6. Parox AF - Maintaining NSR.  He remains on Eliquis.  As noted, if his workup is negative, consider stopping the ASA and remaining on Plavix + Eliquis.  Signed,  Richardson Dopp, PA-C 11/08/2016 11:19 AM

## 2016-11-08 NOTE — ED Notes (Signed)
Admitting at bedside 

## 2016-11-08 NOTE — ED Triage Notes (Signed)
Pt reports substernal chest pain since yesterday around 10 that radiates to the back. Denies shortness of breath or nausea. PT reports hx of 10 stents and states that he has 3-4 runs of afib a month and when he goes out of it he has chest pain. Reports he has been seen for this 2-3 times this month. Pt reports he is due for a stress test but has not been scheduled.

## 2016-11-08 NOTE — H&P (Signed)
Date: 11/08/2016               Patient Name:  Aaron Dodson MRN: [id number]  DOB: 1966-03-16 Age / Sex: 51 y.o., male   PCP: Sandi Mariscal, MD         Medical Service: Internal Medicine Teaching Service         Attending Physician: Dr. Nat Christen, MD    First Contact: Dr.  Einar Gip Pager: [phone]  Second Contact: Dr. Burgess Estelle Pager: [phone]       After Hours (After 5p/  First Contact Pager: [phone]  weekends / holidays): Second Contact Pager: [phone]   Chief Complaint: Chest Pain   History of Present Illness: Patient is a 51 yo M with a pmhx significant for HTN, HLD, CAD s/p PCI (last cath 11/2015 with non obstructive dz), CVA, prior IV drug use with endocarditis in 2016, chronic opioid dependence for back pain, and club foot s/p left AKA who presents with chest pain. Patient reports a history of episodic a-fib occurring once every few weeks that usually resolve without intervention (although never documented clinically per recent cardiology office note). He reports his last episode of a-fib was a few weeks ago. Since then, he has been experiencing intermittent episodes of chest pressure. He follows with Dr. Marlou Porch (cardiology) and was last seen in clinic on 10/13/2016. He was experiencing chest pain at that time that was felt to be atypical and low risk. He has since been seen in the ED multiple times for the same chest pain (today is his third ED visit this month).  Patient left AMA from the ER on 12/9 after difficulty obtaining IV access for chest CTA contrast load. Plan was to discharge home with plans for outpatient stress test if imaging was negative. The following day, patient continued to have chest pain and returned to the ED as advised by his cardiologist. CTA chest was ultimately negative for dissection, PE, or any acute changes. Pain was felt to be non ACS, likely MSK etiology, and he was instructed to follow up with his cardiology. Since that time he has continued to have non  exertional, intermittent chest pain. His most recent episode of chest paint started last night around 10 pm while he was watching TV and has been constant ever since. He describes the pain as a pressure over the left side of his heart that radiates to his back. Reports it feels like his 60 lb pit bull is sitting on his chest. He denies any recent physical activity or heavy lifting. Pain was a 9/10 at it's worst. He took nitroglycerin x 3 without relief, now with a residual headache. He reports a history of GERD for which he takes Protonix but denies any burping or belching. He says this does not feel like his normal reflux symptoms. ROS was otherwise negative.  In the ED, VSS (T 97.7, HR 91, BP 154/94, RR 22, oxygen 100%). He reported continued chest pain, now 7/10 in intensity. BMP and CBC results were within normal limits. I-stat troponin was 0.00. EKG was normal sinus rhythm, ST depression noted in lead V4 but present on prior tracing. No ischemic changes compared to prior ECG. CXR was normal. UDS pending.   Meds:  Current Meds  Medication Sig  . ALPRAZolam (XANAX) 1 MG tablet Take 1 mg by mouth 3 (three) times daily as needed for anxiety or sleep.   Marland Kitchen apixaban (ELIQUIS) 5 MG TABS tablet Take 5 mg by mouth  2 (two) times daily.  Marland Kitchen aspirin EC 81 MG EC tablet Take 1 tablet (81 mg total) by mouth daily.  . clopidogrel (PLAVIX) 75 MG tablet Take 1 tablet (75 mg total) by mouth daily.  Marland Kitchen HYDROcodone-acetaminophen (NORCO) 10-325 MG tablet Take 1 tablet by mouth 4 (four) times daily. scheduled  . ibuprofen (ADVIL,MOTRIN) 200 MG tablet Take 800 mg by mouth every 6 (six) hours as needed (pain).  . metoprolol succinate (TOPROL-XL) 25 MG 24 hr tablet Take 1 tablet (25 mg total) by mouth daily.  Marland Kitchen morphine (MSIR) 15 MG tablet Take 15 mg by mouth 2 (two) times daily. scheduled  . oxyCODONE-acetaminophen (PERCOCET/ROXICET) 5-325 MG tablet Take 1 tablet by mouth every 4 (four) hours as needed for severe pain.  .  rosuvastatin (CRESTOR) 5 MG tablet Take 5 mg by mouth at bedtime.      Allergies: Allergies as of 11/08/2016 - Review Complete 11/08/2016  Allergen Reaction Noted  . Benadryl [diphenhydramine hcl] Other (See Comments) 11/18/2015  . Promethazine hcl Other (See Comments) 11/18/2015  . Augmentin [amoxicillin-pot clavulanate] Diarrhea and Nausea Only 11/18/2015  . Toradol [ketorolac tromethamine] Hives and Other (See Comments) 11/18/2015   Past Medical History:  Diagnosis Date  . ADD (attention deficit disorder)   . Anxiety   . Chronic lower back pain   . Club foot   . Coronary artery disease   . Endocarditis    At [facility]  . GERD (gastroesophageal reflux disease)   . Hyperlipidemia   . Hypertension   . Myocardial infarction 2000's - ~ 04/2015   "I've had a total of 3" (11/18/2015)  . PTSD (post-traumatic stress disorder)   . 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 Stroke [facility]) ~ 2006   denies residual on 11/18/2015    Family History: Heart disease in dad (MI in his 20s) but passed away from metastatic lung cancer. Mom was murdered.   Social History: Lives in [place] with his wife. He does not work. Prior IV drug user, clean now for a year and a half. Quit smoking yesterday. 1 PPD x 33 years. Denies alcohol and current drug use.   Review of Systems: A complete ROS was negative except as per HPI.   Physical Exam: Blood pressure 113/77, pulse (!) 47, temperature 97.7 F (36.5 C), temperature source Oral, resp. rate 15, height 6' (1.829 m), weight 202 lb (91.6 kg), SpO2 97 %. Constitutional: NAD, appears comfortable HEENT: Atraumatic, normocephalic. PERRL, anicteric sclera.  Neck: Supple, trachea midline.  Cardiovascular: RRR, no murmurs, rubs, or gallops. Chest pain semi reproducible with palpation of chest wall.  Pulmonary/Chest: CTAB, no wheezes, rales, or rhonchi. No chest wall abnormalities.  Abdominal: Soft, non tender, non distended. +BS.    Extremities: Left AKA with prosthetic. Right lower extremity warm and well perfused. Distal pulses intact. No edema.  Neurological: A&Ox3, CN II - XII grossly intact.  Skin: No rashes or erythema  Psychiatric: Normal mood and affect  EKG: Personally reviewed. NSR. No ischemic changes from prior tracing. Overall very normal ECG.   CXR: Personally reviewed. Normal chest xray.   Assessment & Plan by Problem:  Chest Pain: Mixed typical and atypical features (chest pressure vs non exertional and not relieved with nitro).  Patient has multiple risk factors (HTN, HLD, chronic tobacco use) including known triple vessel CAD s/p PCI although recent work up is reassuring. Last heart cath in January of 2017 showed patent stents, non obstructive disease, and  normal EF. Multiple recent ER visits this month for similar chest pain with non ischemic EKGs and negative troponins. Chest CTA 10/17/2016 was negative for acute findings. EKG this admission remains non ischemic and I-stat troponin 0.00. Patient is hemodynamically stable, appears comfortable on exam, and is requesting to eat (although reports continued 7/10 chest pain).  -- Cardiology consult, appreciate recommendations -- NPO midnight; stress test tomorrow -- Trend troponins q6 hours x 3  -- Tele monitoring  -- Repeat EKG in AM -- Echocardiogram -- Nitro paste and Voltaren gel prn  -- Continue home ASA 81 daily  -- Continue Plavix 75 mg daily  -- No heparin (Eliquis)   Atrial Fibrillation: Currently in sinus rhythm with normal rates. -- Continue Eliquis 5 mg BID -- Increase home metoprolol 25 mg -> 50 mg q24 hours per cardiology   HLD: -- Continue Crestor 5 mg QHS  Chronic Opioid Dependence: [place]ed [place] controlled substance [place]. Multiple addresses and providers listed on database. Morphine 15 gm BID, Norco 10-325 QID, and Tramadol 50 q6 all listed on home med list in Epic and patient reports he is currently taking all but the tramadol.  However only Norco has been filled since August. Dr. Sandi Mariscal was previously his PCP prescribing these medication but patient reports a recent "falling out" and no longer wishes to follow with Dr. Mikeal Hawthorne. He will need to establish with a new PCP. Caution with opioid prescribing in the setting of prior IV drug abuse and endocarditis.  -- Continue home Norco 10-325 mg QID -- Colace BID -- Senna BID -- UDS pending   Anxiety: Xanax listed on home med list but not listed on [place] controlled substance database. Patient reported that he was taking it TID. -- Hold for now  FEN: No fluids, replete lytes prn, NPO at midnight VTE ppx: Eliquis   Code Status: FULL   Dispo: Admit patient to Observation with expected length of stay less than 2 midnights.  Signed: Velna Ochs, MD 11/08/2016, 11:33 AM  Pager: [phone]

## 2016-11-08 NOTE — ED Provider Notes (Signed)
[place] DEPT Provider Note   CSN: [id number] Arrival date & time: 11/08/16  0818     History   Chief Complaint Chief Complaint  Patient presents with  . Chest Pain    HPI Aaron Dodson is a 51 y.o. male.  Complex patient with "MI 3, stents 10, stroke 2", status post three-vessel CABG presents with dull pressure left sided per chest pain with radiation to left shoulder with associated dyspnea. No nausea or diaphoresis. He took several nitroglycerin with minimal success. CT angiogram on 10/17/16 showed no acute findings. He has been in and out of A. fib in the past and takes Eliquis and Plavix for this. He has been smoking cigarettes. Severity of pain is moderate.      Past Medical History:  Diagnosis Date  . ADD (attention deficit disorder)   . Anxiety   . Chronic lower back pain   . Club foot   . Coronary artery disease   . Endocarditis    At [facility] 2016  . GERD (gastroesophageal reflux disease)   . Hyperlipidemia   . Hypertension   . Myocardial infarction 2000's - ~ 04/2015   "I've had a total of 3" (11/18/2015)  . PTSD (post-traumatic stress disorder)   . 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 Stroke [facility]) ~ 2006   denies residual on 11/18/2015    Patient Active Problem List   Diagnosis Date Noted  . CAD s/p PCI 11/19/2015  . Anxiety state   . Chest pain 11/18/2015  . HLD (hyperlipidemia) 11/18/2015  . Essential hypertension 11/18/2015  . Tobacco abuse 11/18/2015    Past Surgical History:  Procedure Laterality Date  . ABOVE KNEE LEG AMPUTATION Left   . CARDIAC CATHETERIZATION  "several"  . CARDIAC CATHETERIZATION N/A 11/26/2015   Procedure: Left Heart Cath and Coronary Angiography;  Surgeon: Burnell Blanks, MD;  Location: [facility];  Service: Cardiovascular;  Laterality: N/A;  . CLUB FOOT RELEASE Left 1966-12-03  . CORONARY ANGIOPLASTY  "several"  . CORONARY ANGIOPLASTY WITH STENT PLACEMENT  "several"   "total  of 10 stents placed" (11/18/2015)  . LAPAROSCOPIC CHOLECYSTECTOMY    . ORTHOPEDIC SURGERY  09/09/1967-~ 2007   "total of 52 on my left leg; started w/club foot released"  . TONSILLECTOMY         Home Medications    Prior to Admission medications   Medication Sig Start Date End Date Taking? Authorizing Provider  ALPRAZolam Duanne Moron) 1 MG tablet Take 1 mg by mouth 3 (three) times daily as needed for anxiety or sleep.    Yes Historical Provider, MD  aspirin EC 81 MG EC tablet Take 1 tablet (81 mg total) by mouth daily. 07/23/16  Yes Jennifer Chahn-Yang Choi, DO  clopidogrel (PLAVIX) 75 MG tablet Take 1 tablet (75 mg total) by mouth daily. 07/22/16  Yes Jennifer Chahn-Yang Choi, DO  HYDROcodone-acetaminophen (NORCO) 10-325 MG tablet Take 1 tablet by mouth 4 (four) times daily. scheduled   Yes Historical Provider, MD  ibuprofen (ADVIL,MOTRIN) 200 MG tablet Take 800 mg by mouth every 6 (six) hours as needed (pain).   Yes Historical Provider, MD  metoprolol succinate (TOPROL-XL) 25 MG 24 hr tablet Take 1 tablet (25 mg total) by mouth daily. 10/13/16  Yes Jerline Pain, MD  morphine (MSIR) 15 MG tablet Take 15 mg by mouth 2 (two) times daily. scheduled   Yes Historical Provider, MD  oxyCODONE-acetaminophen (PERCOCET/ROXICET) 5-325 MG tablet Take 1 tablet  by mouth every 4 (four) hours as needed for severe pain. 10/17/16  Yes Nona Dell, PA-C  rosuvastatin (CRESTOR) 5 MG tablet Take 5 mg by mouth at bedtime.    Yes Historical Provider, MD  ibuprofen (ADVIL,MOTRIN) 800 MG tablet Take 1 tablet (800 mg total) by mouth every 6 (six) hours as needed for mild pain or moderate pain. Patient not taking: Reported on 11/08/2016 07/22/16   Shon Millet, DO  traMADol (ULTRAM) 50 MG tablet Take 1 tablet (50 mg total) by mouth every 6 (six) hours as needed for severe pain. Patient not taking: Reported on 11/08/2016 09/21/16   Dalia Heading, PA-C    Family History Family History  Problem  Relation Age of Onset  . Hypertension Father   . Lung cancer Father     Social History Social History  Substance Use Topics  . Smoking status: Current Every Day Smoker    Packs/day: 1.00    Years: 35.00    Types: Cigarettes  . Smokeless tobacco: Never Used  . Alcohol use No     Allergies   Benadryl [diphenhydramine hcl]; Promethazine hcl; Augmentin [amoxicillin-pot clavulanate]; and Toradol [ketorolac tromethamine]   Review of Systems Review of Systems  All other systems reviewed and are negative.    Physical Exam Updated Vital Signs BP 134/90 (BP Location: Left Arm)   Pulse 76   Temp 97.7 F (36.5 C) (Oral)   Resp 22   Ht 6' (1.829 m)   Wt 202 lb (91.6 kg)   SpO2 100%   BMI 27.40 kg/m   Physical Exam  Constitutional: He is oriented to person, place, and time. He appears well-developed and well-nourished.  HENT:  Head: Normocephalic and atraumatic.  Eyes: Conjunctivae are normal.  Neck: Neck supple.  Cardiovascular: Normal rate and regular rhythm.   Pulmonary/Chest: Effort normal and breath sounds normal.  Abdominal: Soft. Bowel sounds are normal.  Musculoskeletal: Normal range of motion.  Neurological: He is alert and oriented to person, place, and time.  Skin: Skin is warm and dry.  Psychiatric: He has a normal mood and affect. His behavior is normal.  Nursing note and vitals reviewed.    ED Treatments / Results  Labs (all labs ordered are listed, but only abnormal results are displayed) Labs Reviewed  BASIC METABOLIC PANEL - Abnormal; Notable for the following:       Result Value   Glucose, Bld 108 (*)    Anion gap 4 (*)    All other components within normal limits  CBC WITH DIFFERENTIAL/PLATELET  I-STAT TROPOININ, ED    EKG  EKG Interpretation  Date/Time:  Monday November 08 2016 08:23:48 EST Ventricular Rate:  91 PR Interval:    QRS Duration: 98 QT Interval:  365 QTC Calculation: 450 R Axis:   69 Text Interpretation:  Sinus rhythm  Minimal ST depression, lateral leads Confirmed by Lacinda Axon  MD, Ashrith Sagan ([id number]) on 11/08/2016 8:56:05 AM       Radiology Dg Chest 2 View  Result Date: 11/08/2016 CLINICAL DATA:  Chest pain. EXAM: CHEST  2 VIEW COMPARISON:  10/16/2016 FINDINGS: The heart size and mediastinal contours are within normal limits. Both lungs are clear. The visualized skeletal structures are unremarkable. IMPRESSION: No active cardiopulmonary disease. Electronically Signed   By: Lorriane Shire M.D.   On: 11/08/2016 09:07    Procedures Procedures (including critical care time)  Medications Ordered in ED Medications  morphine 4 MG/ML injection 4 mg (not administered)     Initial  Impression / Assessment and Plan / ED Course  I have reviewed the triage vital signs and the nursing notes.  Pertinent labs & imaging results that were available during my care of the patient were reviewed by me and considered in my medical decision making (see chart for details).  Clinical Course     Complex patient with chest pain. He is hemodynamically stable. EKG and troponin showed no acute changes. Will consult cardiology and admit to IM  Final Clinical Impressions(s) / ED Diagnoses   Final diagnoses:  Precordial pain    New Prescriptions New Prescriptions   No medications on file     Nat Christen, MD 11/08/16 1206

## 2016-11-08 NOTE — ED Notes (Signed)
Patient transported to x-ray. ?

## 2016-11-09 ENCOUNTER — Observation Stay (HOSPITAL_BASED_OUTPATIENT_CLINIC_OR_DEPARTMENT_OTHER): Payer: Medicare HMO

## 2016-11-09 ENCOUNTER — Observation Stay (HOSPITAL_COMMUNITY): Payer: Medicare HMO

## 2016-11-09 DIAGNOSIS — R0789 Other chest pain: Secondary | ICD-10-CM

## 2016-11-09 DIAGNOSIS — Z888 Allergy status to other drugs, medicaments and biological substances status: Secondary | ICD-10-CM

## 2016-11-09 DIAGNOSIS — R079 Chest pain, unspecified: Secondary | ICD-10-CM

## 2016-11-09 DIAGNOSIS — I10 Essential (primary) hypertension: Secondary | ICD-10-CM | POA: Diagnosis not present

## 2016-11-09 DIAGNOSIS — Z955 Presence of coronary angioplasty implant and graft: Secondary | ICD-10-CM

## 2016-11-09 DIAGNOSIS — R7881 Bacteremia: Secondary | ICD-10-CM

## 2016-11-09 DIAGNOSIS — I2510 Atherosclerotic heart disease of native coronary artery without angina pectoris: Secondary | ICD-10-CM | POA: Diagnosis not present

## 2016-11-09 DIAGNOSIS — R509 Fever, unspecified: Secondary | ICD-10-CM

## 2016-11-09 DIAGNOSIS — I4891 Unspecified atrial fibrillation: Secondary | ICD-10-CM | POA: Diagnosis not present

## 2016-11-09 DIAGNOSIS — I252 Old myocardial infarction: Secondary | ICD-10-CM | POA: Diagnosis not present

## 2016-11-09 DIAGNOSIS — Z881 Allergy status to other antibiotic agents status: Secondary | ICD-10-CM

## 2016-11-09 DIAGNOSIS — Z885 Allergy status to narcotic agent status: Secondary | ICD-10-CM

## 2016-11-09 LAB — COMPREHENSIVE METABOLIC PANEL
ALK PHOS: 101 U/L (ref 38–126)
ALT: 12 U/L — ABNORMAL LOW (ref 17–63)
ANION GAP: 8 (ref 5–15)
AST: 21 U/L (ref 15–41)
Albumin: 3.6 g/dL (ref 3.5–5.0)
BUN: 13 mg/dL (ref 6–20)
CALCIUM: 8.8 mg/dL — AB (ref 8.9–10.3)
CO2: 21 mmol/L — AB (ref 22–32)
Chloride: 102 mmol/L (ref 101–111)
Creatinine, Ser: 1.42 mg/dL — ABNORMAL HIGH (ref 0.61–1.24)
GFR calc non Af Amer: 57 mL/min — ABNORMAL LOW (ref >60)
Glucose, Bld: 134 mg/dL — ABNORMAL HIGH (ref 65–99)
Potassium: 3.6 mmol/L (ref 3.5–5.1)
SODIUM: 131 mmol/L — AB (ref 135–145)
TOTAL PROTEIN: 6.5 g/dL (ref 6.5–8.1)
Total Bilirubin: 0.9 mg/dL (ref 0.3–1.2)

## 2016-11-09 LAB — CBC
HEMATOCRIT: 36 % — AB (ref 39.0–52.0)
Hemoglobin: 12.5 g/dL — ABNORMAL LOW (ref 13.0–17.0)
MCH: 32.5 pg (ref 26.0–34.0)
MCHC: 34.7 g/dL (ref 30.0–36.0)
MCV: 93.5 fL (ref 78.0–100.0)
Platelets: 226 10*3/uL (ref 150–400)
RBC: 3.85 MIL/uL — ABNORMAL LOW (ref 4.22–5.81)
RDW: 14 % (ref 11.5–15.5)
WBC: 12.6 10*3/uL — ABNORMAL HIGH (ref 4.0–10.5)

## 2016-11-09 LAB — ECHOCARDIOGRAM COMPLETE
Height: 72 in
Weight: 3019.2 oz

## 2016-11-09 LAB — APTT: aPTT: 37 seconds — ABNORMAL HIGH (ref 24–36)

## 2016-11-09 LAB — TROPONIN I

## 2016-11-09 LAB — HEPARIN LEVEL (UNFRACTIONATED): HEPARIN UNFRACTIONATED: 2.04 [IU]/mL — AB (ref 0.30–0.70)

## 2016-11-09 LAB — GLUCOSE, CAPILLARY: Glucose-Capillary: 99 mg/dL (ref 65–99)

## 2016-11-09 MED ORDER — NITROGLYCERIN 0.4 MG SL SUBL
SUBLINGUAL_TABLET | SUBLINGUAL | Status: AC
  Administered 2016-11-09: 0.4 mg
  Filled 2016-11-09: qty 1

## 2016-11-09 MED ORDER — SENNA 8.6 MG PO TABS: 2.0000 | ORAL_TABLET | Freq: Every day | ORAL | 0 refills | Status: DC

## 2016-11-09 MED ORDER — METOPROLOL SUCCINATE ER 25 MG PO TB24: 25.0000 mg | ORAL_TABLET | Freq: Every day | ORAL | Status: DC

## 2016-11-09 MED ORDER — LEVALBUTEROL HCL 0.63 MG/3ML IN NEBU
INHALATION_SOLUTION | RESPIRATORY_TRACT | Status: AC
  Administered 2016-11-09: 0.63 mg
  Filled 2016-11-09: qty 3

## 2016-11-09 MED ORDER — LEVALBUTEROL HCL 0.63 MG/3ML IN NEBU: 0.6300 mg | INHALATION_SOLUTION | Freq: Four times a day (QID) | RESPIRATORY_TRACT | Status: DC | PRN

## 2016-11-09 MED ORDER — LEVALBUTEROL HCL 0.63 MG/3ML IN NEBU
0.6300 mg | INHALATION_SOLUTION | Freq: Four times a day (QID) | RESPIRATORY_TRACT | Status: DC
  Filled 2016-11-09: qty 3

## 2016-11-09 MED ORDER — APIXABAN 5 MG PO TABS
5.0000 mg | ORAL_TABLET | Freq: Two times a day (BID) | ORAL | Status: DC
  Administered 2016-11-09: 5 mg via ORAL
  Filled 2016-11-09: qty 1

## 2016-11-09 MED ORDER — ALBUTEROL SULFATE (2.5 MG/3ML) 0.083% IN NEBU
INHALATION_SOLUTION | RESPIRATORY_TRACT | Status: AC
Start: 2016-11-09 — End: 2016-11-09
  Administered 2016-11-09: 01:00:00
  Filled 2016-11-09: qty 3

## 2016-11-09 MED ORDER — MORPHINE SULFATE (PF) 2 MG/ML IV SOLN
INTRAVENOUS | Status: AC
  Administered 2016-11-09: 1 mg via INTRAMUSCULAR
  Filled 2016-11-09: qty 1

## 2016-11-09 MED ORDER — HEPARIN (PORCINE) IN NACL 100-0.45 UNIT/ML-% IJ SOLN
1300.0000 [IU]/h | INTRAMUSCULAR | Status: DC
  Administered 2016-11-09: 1300 [IU]/h via INTRAVENOUS
  Filled 2016-11-09: qty 250

## 2016-11-09 MED ORDER — IOPAMIDOL (ISOVUE-370) INJECTION 76%
INTRAVENOUS | Status: AC
  Administered 2016-11-09: 100 mL via INTRAVENOUS
  Filled 2016-11-09: qty 100

## 2016-11-09 MED ORDER — LORAZEPAM 2 MG/ML IJ SOLN
0.5000 mg | Freq: Once | INTRAMUSCULAR | Status: AC
  Administered 2016-11-09: 0.5 mg via INTRAMUSCULAR
  Filled 2016-11-09: qty 1

## 2016-11-09 MED ORDER — MORPHINE SULFATE (PF) 2 MG/ML IV SOLN
1.0000 mg | INTRAVENOUS | Status: AC
  Administered 2016-11-09: 1 mg via INTRAVENOUS

## 2016-11-09 MED ORDER — LEVALBUTEROL HCL 0.63 MG/3ML IN NEBU: 0.6300 mg | INHALATION_SOLUTION | Freq: Once | RESPIRATORY_TRACT | Status: AC

## 2016-11-09 NOTE — Progress Notes (Signed)
IMTS Cross Cover Progress Note  Mr. Aaron Dodson is a 51yo male with PMH of HTN, CAD s/p PCI, CVA, PAF, anxiety and chronic back pain who was admitted for chest pain r/o. Nursing notified IMTS that patient was in respiratory distress and rapid response had been called.   Patient had been sleeping and woke up with shortness of breath, chest tightness and back pain so he called out to nursing. Per report, patient was audibly wheezing, writhing in pain, and having increased work of breathing. An oxygen saturation was unobtainable due to patient motion. Rapid response arrived and administered albuterol neb, nitro patch and placed patient on non-rebreather mask. Earlier in the evening, patient was found to be febrile to 102F and tachycardic to 110.  When IMTS arrived, patient stated that this is similar to past episodes that are the reason he goes to the ED, but this episode is worse than usual; last one occurred day before admission.  Patient was sitting up in bed, in moderate distress, tachypneic, decreased breath sounds throught, tachycardic to 140's. Patient states that he tries to take his Eliquis regularly. Patient was treated with 1mg  morphine IV and xopenex nebulizer with improvement in symptoms. This allowed staff to obtain an adequate EKG. Patient was transitioned to 2L O2 [place] and was saturating to 98%.  EKG showed sinus tachycardia, with mild ST depressions in lateral leads.   Physical Exam: VS: Temp 99.1, 140/100, HR 144, O2 sat 100% on nonrebreather Constitutional: anxious, in mod distress CV: tachycardic, no murmurs, rubs or gallops, pulses intact Resp: tachypneic, decreased breath sounds throughout, no wheezing or crackles but not optimal exam due to patient's anxiety Ext: warm, no edema, pulses intact; s/p L AKA  Assessment & Plan: Patient with acute onset of fever, tachycardia, shortness of breath, bilateral chest pressure and back pain concerning for PE in setting of PAF with questionable  Eliquis adherence. Patient also likely has underlying COPD in setting of decreased breath sounds and witnessed wheezing, and his symptoms was improved by albuterol and xopenex treatments. Anxiety could have also increased his symptoms. --xopenex scheduled q6hrs --CTA to look for PE --change Eliquis to heparin --continue trending troponins (neg x 2 so far)  Alphonzo Grieve, MD IMTS - PGY1 Pager [phone]

## 2016-11-09 NOTE — Progress Notes (Signed)
Date: 11/09/2016  Patient name: Aaron Dodson  Medical record number: [id number]  Date of birth: 02-Feb-1966   I have seen and evaluated Aaron Dodson and discussed their care with the Residency Team. Patient is a 51 year old male with past medical history of hypertension, hyperlipidemia, CAD status post PCI with stent placement, CVA, prior IV drug use with endocarditis in 2016, chronic opioid dependence for back pain, status post left AKA who presents chest pain  3 weeks.  Patient states that he follows up with cardiology as an outpatient for his intermittent A. fib as well as his CAD was last seen by Dr. Marlou Porch on 10/13/2016. At that time patient was noted to have atypical chest pain which is felt to be low risk at that time. Since that time patient has had multiple ED visits for chest pain, the last one prior to this being on 10/16/2016. At that time patient had a CTA of his chest which was negative for dissection, PE or any other acute changes. Patient was discharged home and asked to follow-up with cardiologist as an outpatient. Since that time patient continues to have intermittent chest pain which is nonexertional. On night prior to admission he developed recurrent chest pain which has been constant since then. Patient described the pain as pressure-like, left-sided, radiating to his back, 9 out of 10 at its worst. Pain is not relieved with nitroglycerin.  Overnight, patient complained of worsening chest pain associated with shortness of breath and audible wheezing. He is also noted to be febrile up to 102 and tachycardic to 110. States this is worse than his usual episodes of chest pain. He had a CTA chest done at that time which showed no evidence of PE. Today, patient feels better but still with some mild persistent chest pain.   PMHx, Fam Hx, and/or Soc Hx : As per resident admit note  Vitals:   11/09/16 0748 11/09/16 1300  BP:    Pulse: 77 95  Resp:    Temp:     Gen.: Awake alert and  oriented 3, NAD CVS: Regular rate and rhythm, normal heart sounds Lungs: Clear to auscultation bilaterally Abdomen: Soft, nontender, normoactive bowel sounds Extremities: Left AKA, no edema noted  Assessment and Plan: I have seen and evaluated the patient as outlined above. I agree with the formulated Assessment and Plan as detailed in the residents' note, with the following changes:   1. Chest pain: - Patient presents with intermittent chest pain over the last 2-3 weeks which is atypical in nature in the setting of a history of CAD with stent placement and multiple risk factors including hypertension, hyperlipidemia, chronic tobacco use. - Cardiology follow-up and recommendations appreciated - Patient refusing stress test at this time - Patient with atypical chest pain and negative troponins and had a recent coronary Angiogram which showed patent stents and nonobstructive disease. - No further workup for now per cardiology as patient refuses stress test - Continue with pain control for now - Patient on aspirin and Plavix and Eliquis at home for his CAD and paroxysmal A. Fib. We'll need to discuss with cardiology as to what medications to discharge him home  2. Fever: - Patient noted have fever overnight up to 102 F in the setting of severe pain and shortness of breath. - CT chest with no evidence of PE or pneumonia - Patient has been afebrile since this episode - We'll follow blood cultures - No clear Infectious etiology noted. We will hold off on antibiotics for  now and continue to monitor - It is possible that the fever was secondary to severe pain and not infectious in nature - Likely discharge home in a.m. if no further fevers noted  Aaron Contes, MD 1/2/20182:14 PM

## 2016-11-09 NOTE — Discharge Summary (Signed)
Name: Aaron Dodson MRN: [id number] DOB: 02-19-1966 51 y.o. PCP: No Pcp Per Patient  Date of Admission: 11/08/2016  8:19 AM Date of Discharge: 11/09/2016 Attending Physician: Aldine Contes, MD  Discharge Diagnosis: 1. Chest Pain Active Problems:   Chest pain   Coronary artery disease involving native coronary artery of native heart  Discharge Medications: Allergies as of 11/09/2016      Reactions   Benadryl [diphenhydramine Hcl] Other (See Comments)   Aggressive behavior   Promethazine Hcl Other (See Comments)   Unknown - given for surgical procedure and advised to never take again   Augmentin [amoxicillin-pot Clavulanate] Diarrhea, Nausea Only   Toradol [ketorolac Tromethamine] Hives, Other (See Comments)   Redness. Patient states he tolerated ibuprofen fine.       Medication List    STOP taking these medications   aspirin 81 MG EC tablet   ibuprofen 200 MG tablet Commonly known as:  ADVIL,MOTRIN   ibuprofen 800 MG tablet Commonly known as:  MOTRIN IB   morphine 15 MG tablet Commonly known as:  MSIR   oxyCODONE-acetaminophen 5-325 MG tablet Commonly known as:  PERCOCET/ROXICET   traMADol 50 MG tablet Commonly known as:  ULTRAM     TAKE these medications   ALPRAZolam 1 MG tablet Commonly known as:  XANAX Take 1 mg by mouth 3 (three) times daily as needed for anxiety or sleep.   clopidogrel 75 MG tablet Commonly known as:  PLAVIX Take 1 tablet (75 mg total) by mouth daily.   ELIQUIS 5 MG Tabs tablet Generic drug:  apixaban Take 5 mg by mouth 2 (two) times daily.   HYDROcodone-acetaminophen 10-325 MG tablet Commonly known as:  NORCO Take 1 tablet by mouth 4 (four) times daily. scheduled   metoprolol succinate 25 MG 24 hr tablet Commonly known as:  TOPROL-XL Take 1 tablet (25 mg total) by mouth daily.   rosuvastatin 5 MG tablet Commonly known as:  CRESTOR Take 5 mg by mouth at bedtime.   senna 8.6 MG Tabs tablet Commonly known as:  SENOKOT Take 2  tablets (17.2 mg total) by mouth at bedtime.       Disposition and follow-up:   Aaron Dodson was discharged from [facility] in stable condition.  At the hospital follow up visit please address:  1.  The patients chest pain. Has he had anymore episodes? Are they associated with stress vs relieved with his Xanax? Has he scheduled f/u appt with cardiology?  2.  Labs / imaging needed at time of follow-up: None, unless pt agreeable to stress test.  3.  Pending labs/ test needing follow-up: None,.  Follow-up Appointments: Follow-up Information    [place], [place] .   Specialty:  Cardiology Contact information: [address] [place] [address] [phone]           Hospital Course by problem list: Active Problems:   Chest pain   Coronary artery disease involving native coronary artery of native heart   1. Atypical Chest Pain Mr. Delmundo presented to ED 11/08/2016 for evaluation of a several week history of chest pain in the setting of known CAD with multiple stents placed, HTN, HLD, chronic tobacco abuse and FHx of MI. This was his 3rd presentation to [facility] and 3rd admission here at [facility]. His chest pain is atypical: non-exertional, intermittent pressure which radiates to his back. Is also associated with SOB and anxiety and his symptoms improve with anxiolytics. Cardiac work-up performed during this admission included  negative troponins x4, EKG x2 without changes consistent with ACS, ECHO showed only grade 1 diastolic dysfunction. He was seen by cardiology who scheduled an in-patient stress test however the patient refused this several times out of feeling apprehensive about his SOB and chest pressure. Several discussions were had with the pt regarding the importance of this test, however he continued to refuse. At this point, cardiology recommended d/c with f/u with his outpatient cardiologist.   Discharge Vitals:   BP 123/62 (BP Location: Left Arm)    Pulse 95   Temp 97.5 F (36.4 C) (Oral)   Resp 18   Ht 6' (1.829 m)   Wt 188 lb 11.2 oz (85.6 kg)   SpO2 91%   BMI 25.59 kg/m   Pertinent Labs, Studies, and Procedures:  Troponins: negative x4 ECHO: LVEF 55-60% with grade 1 diastolic dysfunction EKG x2 : without changes consistent with ACS Stress test: REFUSED.   Discharge Instructions: Discharge Instructions    Increase activity slowly    Complete by:  As directed     Patient will need to follow-up with a PCP as well as his cardiologists office regarding his persistent intermittent chest pain.   SignedEinar Gip, DO 11/09/2016, 4:09 PM   Pager: [phone]

## 2016-11-09 NOTE — Progress Notes (Signed)
ANTICOAGULATION CONSULT NOTE - Initial Consult  Pharmacy Consult for heparin Indication: atrial fibrillation and r/o ACS vs PE  Allergies  Allergen Reactions  . Benadryl [Diphenhydramine Hcl] Other (See Comments)    Aggressive behavior  . Promethazine Hcl Other (See Comments)    Unknown - given for surgical procedure and advised to never take again  . Augmentin [Amoxicillin-Pot Clavulanate] Diarrhea and Nausea Only  . Toradol [Ketorolac Tromethamine] Hives and Other (See Comments)    Redness. Patient states he tolerated ibuprofen fine.     Patient Measurements: Height: 6' (182.9 cm) Weight: 196 lb 6.4 oz (89.1 kg) IBW/kg (Calculated) : 77.6  Vital Signs: Temp: 99.1 F (37.3 C) (01/02 0000) Temp Source: Oral (01/01 2200) BP: 134/85 (01/01 2000) Pulse Rate: 110 (01/01 2200)  Labs:  Recent Labs  11/08/16 0837 11/08/16 1527 11/08/16 1938  HGB 14.1  --   --   HCT 40.1  --   --   PLT 302  --   --   CREATININE 1.15  --   --   TROPONINI  --  <0.03 <0.03    Estimated Creatinine Clearance: 85.3 mL/min (by C-G formula based on SCr of 1.15 mg/dL).   Medical History: Past Medical History:  Diagnosis Date  . ADD (attention deficit disorder)   . Anxiety   . Chronic lower back pain   . Club foot   . COPD (chronic obstructive pulmonary disease) ([place])   . Coronary artery disease    a. s/p multiple prior PCIs (LAD, LCx, OM1 - [place]; [place]) // b. LHC 1/17: LM 30, mLAD 20, dLAD stent patent, oLCx 40 within stent but o/w patent with 50-70 beyond stent (jailed), OM1 stent patent, oRCA stent patent with 20 ISR, EF 55-65 // c. Echo 1/17: EF 50-55, no RWMA, Gr 1 DD, mild AI  . Endocarditis    hx of IV drug abuse; tx At [facility] 2016  . GERD (gastroesophageal reflux disease)   . Hyperlipidemia   . Hypertension   . Myocardial infarction 2000's - ~ 04/2015   "I've had a total of 3" (11/08/2016)  . PAF (paroxysmal atrial fibrillation) (HCC)    on Eliquis  . PTSD (post-traumatic  stress disorder)   . Pulmonary embolism ([place])    "I've had 2 or 3" (11/08/2016)  . 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 Squamous cell carcinoma of scalp   . Stroke [facility]) ~ 2005-2006 X 2   denies residual on 11/08/2016    Medications:  Prescriptions Prior to Admission  Medication Sig Dispense Refill Last Dose  . ALPRAZolam (XANAX) 1 MG tablet Take 1 mg by mouth 3 (three) times daily as needed for anxiety or sleep.    11/07/2016 at Unknown time  . apixaban (ELIQUIS) 5 MG TABS tablet Take 5 mg by mouth 2 (two) times daily.   11/07/2016 at [place]  . aspirin EC 81 MG EC tablet Take 1 tablet (81 mg total) by mouth daily. 30 tablet 0 Past Week at Unknown time  . clopidogrel (PLAVIX) 75 MG tablet Take 1 tablet (75 mg total) by mouth daily. 30 tablet 0 11/08/2016 at Unknown time  . HYDROcodone-acetaminophen (NORCO) 10-325 MG tablet Take 1 tablet by mouth 4 (four) times daily. scheduled   11/07/2016 at Unknown time  . ibuprofen (ADVIL,MOTRIN) 200 MG tablet Take 800 mg by mouth every 6 (six) hours as needed (pain).   11/07/2016 at Unknown time  . metoprolol succinate (TOPROL-XL) 25 MG 24  hr tablet Take 1 tablet (25 mg total) by mouth daily. 30 tablet 6 11/07/2016 at [place]  . morphine (MSIR) 15 MG tablet Take 15 mg by mouth 2 (two) times daily. scheduled   11/07/2016 at Unknown time  . oxyCODONE-acetaminophen (PERCOCET/ROXICET) 5-325 MG tablet Take 1 tablet by mouth every 4 (four) hours as needed for severe pain. 12 tablet 0 11/07/2016 at Unknown time  . rosuvastatin (CRESTOR) 5 MG tablet Take 5 mg by mouth at bedtime.    11/07/2016 at Unknown time  . ibuprofen (ADVIL,MOTRIN) 800 MG tablet Take 1 tablet (800 mg total) by mouth every 6 (six) hours as needed for mild pain or moderate pain. (Patient not taking: Reported on 11/08/2016) 90 tablet 0 Not Taking at Unknown time  . traMADol (ULTRAM) 50 MG tablet Take 1 tablet (50 mg total) by mouth every 6 (six) hours as needed for severe pain. (Patient  not taking: Reported on 11/08/2016) 15 tablet 0 Not Taking at Unknown time   Scheduled:  . aspirin EC  81 mg Oral Daily  . Chlorhexidine Gluconate Cloth  6 each Topical Q0600  . clopidogrel  75 mg Oral Daily  . diclofenac sodium  4 g Topical QID  . docusate sodium  100 mg Oral BID  . HYDROcodone-acetaminophen  1 tablet Oral QID  . metoprolol succinate  50 mg Oral Daily  . mupirocin ointment  1 application Nasal BID  . nicotine  14 mg Transdermal Daily  . rosuvastatin  20 mg Oral QHS  . senna  1 tablet Oral BID  . sodium chloride flush  3 mL Intravenous Q12H    Assessment: 51yo male c/o substernal CP radiating to back, troponins negative but known h/o CAD, also concern for PE awaiting imaging, to transition from Eliquis for PAF to heparin; last dose of Eliquis was 1/1 at 9p as inpatient.  Goal of Therapy:  Heparin level 0.3-0.7 units/ml aPTT 66-102 seconds Monitor platelets by anticoagulation protocol: Yes   Plan:  At [place] will start heparin gtt at 1300 units/hr and monitor heparin levels, aPTT (while Eliquis is affecting anti-Xa), and CBC.  Wynona Neat, PharmD, BCPS  11/09/2016,1:32 AM

## 2016-11-09 NOTE — Progress Notes (Signed)
Patient Name: Aaron Dodson Date of Encounter: 11/09/2016  Primary Cardiologist: [facility] Problem List     Active Problems:   Chest pain   Coronary artery disease involving native coronary artery of native heart     Subjective   Head significant chest pain overnight, rapid response called. Chronic back pain as well.  Inpatient Medications    Scheduled Meds: . Chlorhexidine Gluconate Cloth  6 each Topical Q0600  . clopidogrel  75 mg Oral Daily  . diclofenac sodium  4 g Topical QID  . docusate sodium  100 mg Oral BID  . HYDROcodone-acetaminophen  1 tablet Oral QID  . levalbuterol  0.63 mg Nebulization Q6H  . metoprolol succinate  50 mg Oral Daily  . mupirocin ointment  1 application Nasal BID  . nicotine  14 mg Transdermal Daily  . rosuvastatin  20 mg Oral QHS  . senna  1 tablet Oral BID  . sodium chloride flush  3 mL Intravenous Q12H   Continuous Infusions: . heparin 1,300 Units/hr (11/09/16 0910)   PRN Meds: acetaminophen **OR** acetaminophen, ALPRAZolam, nitroGLYCERIN, ondansetron **OR** ondansetron (ZOFRAN) IV   Vital Signs    Vitals:   11/09/16 0155 11/09/16 0218 11/09/16 0500 11/09/16 0651  BP: (!) 86/60 (!) 92/57 (!) 89/66 91/67  Pulse: (!) 110 (!) 112 78 (!) 52  Resp:   18   Temp:   97.5 F (36.4 C)   TempSrc:   Oral   SpO2: 95% 97% 96% (!) 83%  Weight:   188 lb 11.2 oz (85.6 kg)   Height:        Intake/Output Summary (Last 24 hours) at 11/09/16 0922 Last data filed at 11/08/16 2300  Gross per 24 hour  Intake              480 ml  Output                0 ml  Net              480 ml   Filed Weights   11/08/16 0830 11/08/16 1311 11/09/16 0500  Weight: 202 lb (91.6 kg) 196 lb 6.4 oz (89.1 kg) 188 lb 11.2 oz (85.6 kg)    Physical Exam    GEN: Well nourished, well developed, in no acute distress.  HEENT: Grossly normal.  Neck: Supple, no JVD, carotid bruits, or masses. Cardiac: RRR, no murmurs, rubs, or gallops. No clubbing, cyanosis,  edema, Left AKA-prosthesis.  Radials/DP/PT 2+  Respiratory:  Respirations regular and unlabored, clear to auscultation bilaterally. GI: Soft, nontender, nondistended, BS + x 4. MS: no deformity or atrophy. Skin: warm and dry, no rash. Neuro:  Strength and sensation are intact. Psych: AAOx3.  Normal affect.  Labs    CBC  Recent Labs  11/08/16 0837 11/09/16 0202  WBC 8.3 12.6*  NEUTROABS 4.2  --   HGB 14.1 12.5*  HCT 40.1 36.0*  MCV 93.5 93.5  PLT 302 [id number]   Basic Metabolic Panel  Recent Labs  11/08/16 0837 11/09/16 0202  NA 138 131*  K 3.7 3.6  CL 107 102  CO2 27 21*  GLUCOSE 108* 134*  BUN 7 13  CREATININE 1.15 1.42*  CALCIUM 9.3 8.8*   Liver Function Tests  Recent Labs  11/09/16 0202  AST 21  ALT 12*  ALKPHOS 101  BILITOT 0.9  PROT 6.5  ALBUMIN 3.6   No results for input(s): LIPASE, AMYLASE in the last 72 hours. Cardiac Enzymes  Recent  Labs  11/08/16 1527 11/08/16 1938 11/09/16 0202  TROPONINI <0.03 <0.03 <0.03   BNP Invalid input(s): POCBNP D-Dimer No results for input(s): DDIMER in the last 72 hours. Hemoglobin A1C No results for input(s): HGBA1C in the last 72 hours. Fasting Lipid Panel No results for input(s): CHOL, HDL, LDLCALC, TRIG, CHOLHDL, LDLDIRECT in the last 72 hours. Thyroid Function Tests No results for input(s): TSH, T4TOTAL, T3FREE, THYROIDAB in the last 72 hours.  Invalid input(s): FREET3  Telemetry    No adverse arrhythmias - Personally Reviewed  ECG    NSR, HR 91, normal axis, NSSTTW changes, no significant change since last tracing. - Personally Reviewed  Radiology    Dg Chest 2 View  Result Date: 11/08/2016 CLINICAL DATA:  Chest pain. EXAM: CHEST  2 VIEW COMPARISON:  10/16/2016 FINDINGS: The heart size and mediastinal contours are within normal limits. Both lungs are clear. The visualized skeletal structures are unremarkable. IMPRESSION: No active cardiopulmonary disease. Electronically Signed   By: Lorriane Shire M.D.   On: 11/08/2016 09:07   Ct Angio Chest Pe W Or Wo Contrast  Result Date: 11/09/2016 CLINICAL DATA:  Sudden onset dyspnea tonight EXAM: CT ANGIOGRAPHY CHEST WITH CONTRAST TECHNIQUE: Multidetector CT imaging of the chest was performed using the standard protocol during bolus administration of intravenous contrast. Multiplanar CT image reconstructions and MIPs were obtained to evaluate the vascular anatomy. CONTRAST:  100 mL Isovue 370 intravenous COMPARISON:  None. FINDINGS: Cardiovascular: Satisfactory opacification of the pulmonary arteries to the segmental level. No evidence of pulmonary embolism. Normal heart size. No pericardial effusion. Mediastinum/Nodes: No enlarged mediastinal, hilar, or axillary lymph nodes. Thyroid gland, trachea, and esophagus demonstrate no significant findings. Lungs/Pleura: Centrilobular emphysematous disease, upper lobe predominant. No consolidation. No effusions. Airways are patent and normal in caliber. Upper Abdomen: No acute abnormality. Musculoskeletal: No chest wall abnormality. No acute or significant osseous findings. Review of the MIP images confirms the above findings. IMPRESSION: Negative for pulmonary embolism. Centrilobular emphysematous disease. Electronically Signed   By: Andreas Newport M.D.   On: 11/09/2016 03:39    Cardiac Studies   Cardiac catheterization 11/26/15 Narrative & Impression    1. Triple vessel CAD 2. The LAD has mild plaque in the proximal segment and a patent stent in the distal/apical segment without restenosis.  3. The Circumflex has an ostial stent that extends into OM1. There is 40% ostial narrowing within the stent. The continuation of the AV groove Circumflex beyond OM1 is jailed by the stent with 50-70% stenosis. This continuation branch of the AV groove Circumflex is small in caliber.  4. The RCA has patent stents extending from the ostium of the vessel down into the PDA. There is diffuse mild stent restenosis.  5.  The left main has a distal 30% stenosis.  6. Normal LV systolic function  Recommendations: Continue medical management of CAD. He can be discharged tonight after bedrest.      - Technically difficult; low normal to mild global reduction in LV   function; EF 50; grade 1 diastolic dysfunction; aortic valve not   well visualized but appears calcified with reduced cusp   excursion; no AS by doppler; mild AI.  Patient Profile     52 yo male with hx of CAD and multiple PCIs in the past, parox AF, prior stroke, chronic pain, HTN, HL, tobacco abuse, prior IV drug abuse and endocarditis.  His last heart catheterization in 1/17 demonstrated patent stents and non-obstructive CAD.  He is on Eliquis, Plavix,  ASA.  He presents with chest pain continuous severe that was not responsive to NTG.  Troponin are normal.  Assessment & Plan    Chest pain  - Prolonged, sharp, atypical chest pain, no evidence of aortic dissection on CT scan. He has been to the emergency room many many times with chest discomfort. Thankfully, we have cardiac catheterization last January.  - Troponins have been normal. Recent heart catheterization 1 year ago demonstrated patent stents, no further PCI necessary.  - Awaiting nuclear stress test   - Continue aspirin, Plavix, statin, beta blocker  - On Eliquis for atrial fibrillation, no heparin  - Increase Toprol originally to 50 mg however he is hypotensive and we will cut this back  - Echocardiogram has been ordered as well as  - I like the idea of discontinuing his aspirin and continuing with Plavix and Eliquis  Coronary artery disease  - Heart catheterization 1/17 with nonobstructive CAD, patent stents.  - EKG currently unchanged, troponins are normal. No cardiac catheterization at this time.  - Proceeding with stress test.  - If stress test is markedly abnormal, we will take a closer look with angiogram.   Essential hypertension  - Currently hypotensive. We will cut back  his Toprol to 25.  Hyperlipidemia  - Crestor increased to 20. High intensity.  Tobacco use  - Despite his multivessel coronary artery disease, leg amputation and multiple trips to the emergency room and recent diagnostic cardiac catheterization in January 2017, he continues to smoke. He understands the deleterious effects of smoking and increased risk of premature death.   Paroxysmal atrial fibrillation  - Currently in sinus rhythm.  - It would be reasonable to just place on Plavix and aspirin only.  Signed, Candee Furbish, MD  11/09/2016, 9:22 AM

## 2016-11-09 NOTE — Progress Notes (Signed)
C/O difficulty breathing. Loud audible wheeze noted. Tachypneic in 30's and Tachycardic to 130 - 140 bpm in ST. SpO2% = 95 - 98 % on RA. O2 applied at 2L via [place] for comfort and reassurance offered. RT notified. Per RT Neb not needed as episode most likely anxiety/panic attack related. Dr. Danford Bad notifed. Order received for Ativan IM which was given at this time. Pt's breathing relieved with this administration and HR decreasing to normal limits. Denies CP. Pt refusing Stress Test. Will notify MD.

## 2016-11-09 NOTE — Significant Event (Addendum)
Rapid Response Event Note  Overview: Time Called: 0055 Arrival Time: 0055 Event Type: Respiratory  Initial Focused Assessment: While rounding, RNs asked for help to assess patient.  Patient was in acute respiratory distress. Patient was writhing in pain, moaning, he complained of having trouble breathing and was having chest pain that radiated into his back.  Patient's lung sounds were tight, very minimal air movement, audible wheezing, no stridor, HR in the 130s and SBP in the 150s.  Patient was given albuterol neb, along with NTG paste and SL NTG. IMTS MD came to bedside, patient was administered 1mg  Morphine IV and RT administered Xopenox neb as well.  Patient's oxygen sats did not register at first but patient was calm, sats were in the mid to upper 90s. Patient was anxious prior to this episode. After the xopenox, lung sounds improved greatly, clear in the uppers and diminished in the lowers, chest pain.  HR better, was in the 150s now in the 115-120 range ST, BP maintained stable.  Interventions: -- albuterol, xopenox, EKG, NTG SL and NTG patch -- Xopenox ordered PRN -- EKG no evidence of acute event -- CTA for PE rule out and patient to be placed on heparin drip  Plan of Care (if not transferred): -- SBP in the upper 80s, NTG paste removed, I informed to RNs to monitor BP and patient is symptomatic to page MDs. -- called for update at 500, patient stable, CT - for PE  Event Summary: Name of Physician Notified: IMTS MD by primary RN at 0055    at    Outcome: Stayed in room and stabalized  Event End Time: [place], [place]

## 2016-11-09 NOTE — Progress Notes (Signed)
Discussed with Dr. Marlou Porch. PT is still refusing stress test. Reassuring cath last year. Continue Plavix and Eliquis. Discontinue aspirin. See progress note for further recommendation.  He can be discharged on cardiac stand point.

## 2016-11-09 NOTE — Progress Notes (Signed)
Subjective: Aaron Dodson was seen and evaluated today at bedside. Patient reports that at around 1:15 this morning he awoke gasping for air, chest tightness, and back pain. He reports this is similar to his presenting symptoms however worse than it has ever been. Progress note from night team was reviewed notes that rapid response team was called and he was found to have increased work of breathing, audibly wheezing and was writhing in pain. The patient also was febrile overnight with a T-max at 102* and tachycardic to 110. Pt reports that at that time he could tell he didn't feel right however denied any fevers prior to presentation.   Objective:  Vital signs in last 24 hours: Vitals:   11/09/16 0155 11/09/16 0218 11/09/16 0500 11/09/16 0651  BP: (!) 86/60 (!) 92/57 (!) 89/66 91/67  Pulse: (!) 110 (!) 112 78 (!) 52  Resp:   18   Temp:   97.5 F (36.4 C)   TempSrc:   Oral   SpO2: 95% 97% 96% (!) 83%  Weight:   188 lb 11.2 oz (85.6 kg)   Height:       General: Caucasian male, appears older than stated age. Visibly anxious about overnight events however In no acute distress and appeared to be resting comfortably in bed.  HENT: EOMI. No conjunctival injection, icterus or ptosis. Cardiovascular: Tachycardic however otherwise regular. No murmur or rub appreciated. Pulmonary: CTA BL however diminished. No wheezing, crackles or rhonchi appreciated however poor inspiratory effort. Unlabored breathing.  Abdomen: Soft, non-distended. Non-tender.+bowel sounds.  Extremities: No peripheral edema noted BL. Left AKA. Skin: Warm, dry. No cyanosis.   Psych: Mood anxious and affect was mood congruent. Responds to questions appropriately.    CBC Latest Ref Rng & Units 11/09/2016 11/08/2016 10/16/2016  WBC 4.0 - 10.5 K/uL 12.6(H) 8.3 10.8(H)  Hemoglobin 13.0 - 17.0 g/dL 12.5(L) 14.1 14.7  Hematocrit 39.0 - 52.0 % 36.0(L) 40.1 41.7  Platelets 150 - 400 K/uL 226 302 343   CMP Latest Ref Rng & Units 11/09/2016  11/08/2016 10/16/2016  Glucose 65 - 99 mg/dL 134(H) 108(H) 93  BUN 6 - 20 mg/dL 13 7 7   Creatinine 0.61 - 1.24 mg/dL 1.42(H) 1.15 1.06  Sodium 135 - 145 mmol/L 131(L) 138 134(L)  Potassium 3.5 - 5.1 mmol/L 3.6 3.7 3.7  Chloride 101 - 111 mmol/L 102 107 101  CO2 22 - 32 mmol/L 21(L) 27 23  Calcium 8.9 - 10.3 mg/dL 8.8(L) 9.3 9.5  Total Protein 6.5 - 8.1 g/dL 6.5 - -  Total Bilirubin 0.3 - 1.2 mg/dL 0.9 - -  Alkaline Phos 38 - 126 U/L 101 - -  AST 15 - 41 U/L 21 - -  ALT 17 - 63 U/L 12(L) - -   Troponins: <0.03 x4  Drugs of Abuse     Component Value Date/Time   LABOPIA POSITIVE (A) 11/08/2016 1720   COCAINSCRNUR NONE DETECTED 11/08/2016 1720   LABBENZ POSITIVE (A) 11/08/2016 1720   AMPHETMU NONE DETECTED 11/08/2016 1720   THCU NONE DETECTED 11/08/2016 1720   LABBARB NONE DETECTED 11/08/2016 1720    CTA 11/09/2016: Negative for pulmonary embolus. Centrilobular emphysematous disease noted. No consolidation.  Echo 11/26/15 EF 50-55, no RWMA, Gr 1 DD, mild AI  LHC 11/26/15 1. Triple vessel CAD 2. The LAD has mild plaque in the proximal segment and a patent stent in the distal/apical segment without restenosis.  3. The Circumflex has an ostial stent that extends into OM1. There is 40%  ostial narrowing within the stent. The continuation of the AV groove Circumflex beyond OM1 is jailed by the stent with 50-70% stenosis. This continuation branch of the AV groove Circumflex is small in caliber.  4. The RCA has patent stents extending from the ostium of the vessel down into the PDA. There is diffuse mild stent restenosis.  5. The left main has a distal 30% stenosis.  6. Normal LV systolic function Recommendations: Continue medical management of CAD. He can be discharged tonight after bedrest.   Assessment/Plan: This is a 51 y/o M with Mhx significant for CAD s/p PCI + stenting numerous times, HTN, HLD, tobacco abuse, PAF and hx of IVDU complicated by endocarditis in 2016 who presents for  evaluation of chest pain. He has presented 3 times to our ED this month and admitted 3 times to [facility] for evaluation of the same in the past 6 months, however has been seen at multiple other hospitals as well. Pt reports chest pressure with pain which radiates to his back however is not responsive to nitro. He follows with cardiology as an outpatient who saw him 10/13/2016 and who felt that his chest pain was low-risk. He presently refuses stress test.  Active Problems:   Chest pain   Coronary artery disease involving native coronary artery of native heart  Chest Pain Pt woke overnight gasping for air and had another episode of chest tightness, chest pain with radiation to his back. Did not improve with nitro, EKG obtained during episode showed sinus tachycardia. Known CAD s/p multiple stents with LHC 11/2015 demonstrated non-occlusive disease. Cardiology has seen the patient and rec stress test however patient continues to refuse this examination. Pt has anxiety regarding his SOB and chest tightness resolves with benzo. Pt continues to refuse inpatient stress test secondary to concerns of SOB. Per cards note, pt can be d/c from their standpoint. I suspect anxiety plays a key role in the pts symptomology.  -Trops negative x4 -Reassuring cath last year -ECHO in progress, will f/u results -Pt refuses stress test -Continue plavix  -Toprol initially increased to 50mg  however pt has had some hypotension - changed to 25mg . -D/c heparin, resume Eliquis per pharm  PAF NSR during examination. Cards recs continuing Eliquis.    Fever T-max 102.4* overnight, responded to tylenol. Unclear etiology. Denied any nausea, vomiting, diarrhea or abdominal pain. Slight leukocytosis today at 12.6. CTA was negative for infectious process. Ordered UA. As pt has history of endocarditis, will obtain blood cultures.  -f/u results of UA and blood cultures -Tylenol for fever, HA  Dispo: Anticipated discharge in approximately  1 day.  Aaron Damron, DO 11/09/2016, 7:57 AM Pager: [phone]

## 2016-11-09 NOTE — Progress Notes (Signed)
ANTICOAGULATION CONSULT NOTE - Initial Consult  Pharmacy Consult for Apixaban Indication: atrial fibrillation  Allergies  Allergen Reactions  . Benadryl [Diphenhydramine Hcl] Other (See Comments)    Aggressive behavior  . Promethazine Hcl Other (See Comments)    Unknown - given for surgical procedure and advised to never take again  . Augmentin [Amoxicillin-Pot Clavulanate] Diarrhea and Nausea Only  . Toradol [Ketorolac Tromethamine] Hives and Other (See Comments)    Redness. Patient states he tolerated ibuprofen fine.     Patient Measurements: Height: 6' (182.9 cm) Weight: 188 lb 11.2 oz (85.6 kg) IBW/kg (Calculated) : 77.6  Vital Signs: Temp: 97.5 F (36.4 C) (01/02 0500) Temp Source: Oral (01/02 0500) BP: 123/62 (01/02 1455) Pulse Rate: 95 (01/02 1300)  Labs:  Recent Labs  11/08/16 0837 11/08/16 1527 11/08/16 1938 11/09/16 0202  HGB 14.1  --   --  12.5*  HCT 40.1  --   --  36.0*  PLT 302  --   --  226  APTT  --   --   --  37*  HEPARINUNFRC  --   --   --  2.04*  CREATININE 1.15  --   --  1.42*  TROPONINI  --  <0.03 <0.03 <0.03    Estimated Creatinine Clearance: 69.1 mL/min (by C-G formula based on SCr of 1.42 mg/dL (H)).   Medical History: Past Medical History:  Diagnosis Date  . ADD (attention deficit disorder)   . Anxiety   . Chronic lower back pain   . Club foot   . COPD (chronic obstructive pulmonary disease) ([place])   . Coronary artery disease    a. s/p multiple prior PCIs (LAD, LCx, OM1 - [place]; [place]) // b. LHC 1/17: LM 30, mLAD 20, dLAD stent patent, oLCx 40 within stent but o/w patent with 50-70 beyond stent (jailed), OM1 stent patent, oRCA stent patent with 20 ISR, EF 55-65 // c. Echo 1/17: EF 50-55, no RWMA, Gr 1 DD, mild AI  . Endocarditis    hx of IV drug abuse; tx At [facility] 2016  . GERD (gastroesophageal reflux disease)   . Hyperlipidemia   . Hypertension   . Myocardial infarction 2000's - ~ 04/2015   "I've had a total of 3"  (11/08/2016)  . PAF (paroxysmal atrial fibrillation) (H[facility])    on Eliquis  . PTSD (post-traumatic stress disorder)   . Pulmonary embolism ([place])    "I've had 2 or 3" (11/08/2016)  . 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 Squamous cell carcinoma of scalp   . Stroke [facility]) ~ 2005-2006 X 2   denies residual on 11/08/2016    Medications:  Scheduled:  . Chlorhexidine Gluconate Cloth  6 each Topical Q0600  . clopidogrel  75 mg Oral Daily  . diclofenac sodium  4 g Topical QID  . docusate sodium  100 mg Oral BID  . HYDROcodone-acetaminophen  1 tablet Oral QID  . [START ON 11/10/2016] metoprolol succinate  25 mg Oral Daily  . mupirocin ointment  1 application Nasal BID  . nicotine  14 mg Transdermal Daily  . rosuvastatin  20 mg Oral QHS  . senna  1 tablet Oral BID  . sodium chloride flush  3 mL Intravenous Q12H    Assessment: 51yo male with AFib and chest pain.  CT was (-)PE, for stress test to be completed as outpt and pt to resume Eliquis.  Goal of Therapy:  Prevention of stroke in setting of  AFib   Plan:  Apixaban 5mg  po bid Watch for s/s of bleeding D/C heparin and all heparin labs  Gracy Bruins, PharmD Clinical Pharmacist [facility]

## 2016-11-09 NOTE — Progress Notes (Signed)
Pt pushed call light complaining of extreme chest pain and SOB. Audible wheezing was heard from doorway. Pt was extremely anxious, grabbing his chest and according to pt "in extreme pain". Non-rebreather applied with O2 at 15L, EKG obtained. Rapid Response Nurse and IMTS at bedside. Albuterol, nitro and other medications given (see MAR). CT ordered, heparin due to start in am, bp currently 92/57 MAP 65, HR 113, A&Ox4. Will continue to monitor pt.

## 2016-11-10 ENCOUNTER — Telehealth: Payer: Self-pay | Admitting: *Deleted

## 2016-11-10 ENCOUNTER — Encounter: Payer: Self-pay | Admitting: Physician Assistant

## 2016-11-10 NOTE — Telephone Encounter (Signed)
Lvm with woman who answered phone to please have ptcb so that I may go over his test results with him. I gave woman [phone] for ptcb .

## 2016-11-11 LAB — BLOOD CULTURE ID PANEL (REFLEXED)
ACINETOBACTER BAUMANNII: NOT DETECTED
CANDIDA ALBICANS: NOT DETECTED
CANDIDA GLABRATA: NOT DETECTED
CANDIDA PARAPSILOSIS: NOT DETECTED
Candida krusei: NOT DETECTED
Candida tropicalis: NOT DETECTED
ENTEROBACTER CLOACAE COMPLEX: NOT DETECTED
ENTEROBACTERIACEAE SPECIES: NOT DETECTED
ENTEROCOCCUS SPECIES: NOT DETECTED
ESCHERICHIA COLI: NOT DETECTED
HAEMOPHILUS INFLUENZAE: NOT DETECTED
Klebsiella oxytoca: NOT DETECTED
Klebsiella pneumoniae: NOT DETECTED
LISTERIA MONOCYTOGENES: NOT DETECTED
Neisseria meningitidis: NOT DETECTED
Proteus species: NOT DETECTED
Pseudomonas aeruginosa: NOT DETECTED
STREPTOCOCCUS AGALACTIAE: NOT DETECTED
STREPTOCOCCUS PYOGENES: NOT DETECTED
STREPTOCOCCUS SPECIES: DETECTED — AB
Serratia marcescens: NOT DETECTED
Staphylococcus aureus (BCID): NOT DETECTED
Staphylococcus species: NOT DETECTED
Streptococcus pneumoniae: NOT DETECTED

## 2016-11-11 NOTE — Telephone Encounter (Signed)
Tried to reach pt x 2 to go over echo results. No answer today.

## 2016-11-12 ENCOUNTER — Telehealth: Payer: Self-pay | Admitting: Internal Medicine

## 2016-11-12 LAB — CULTURE, BLOOD (ROUTINE X 2)

## 2016-11-12 NOTE — Telephone Encounter (Signed)
Tried to call Aaron Dodson x3 yesterday and x2 today without answer. No voicemail and got busy signal x2 today. Spoke with micro lab re: blood culture results. Returns as Strep viridians. Thought to be contaminate as was found in 1 bottle and is normal skin flora. Will however continue to try to contact patient to check in.

## 2016-11-13 IMAGING — DX DG CHEST 2V
2 series · 2 of 2 positions shown · non-contrast
Comparison: 06/13/2016

CLINICAL DATA: Chest pain and shortness of breath for 2 days.
Atrial fibrillation. Coronary artery disease. Previous myocardial
infarction.

EXAM:
CHEST  2 VIEW

[chest pa]
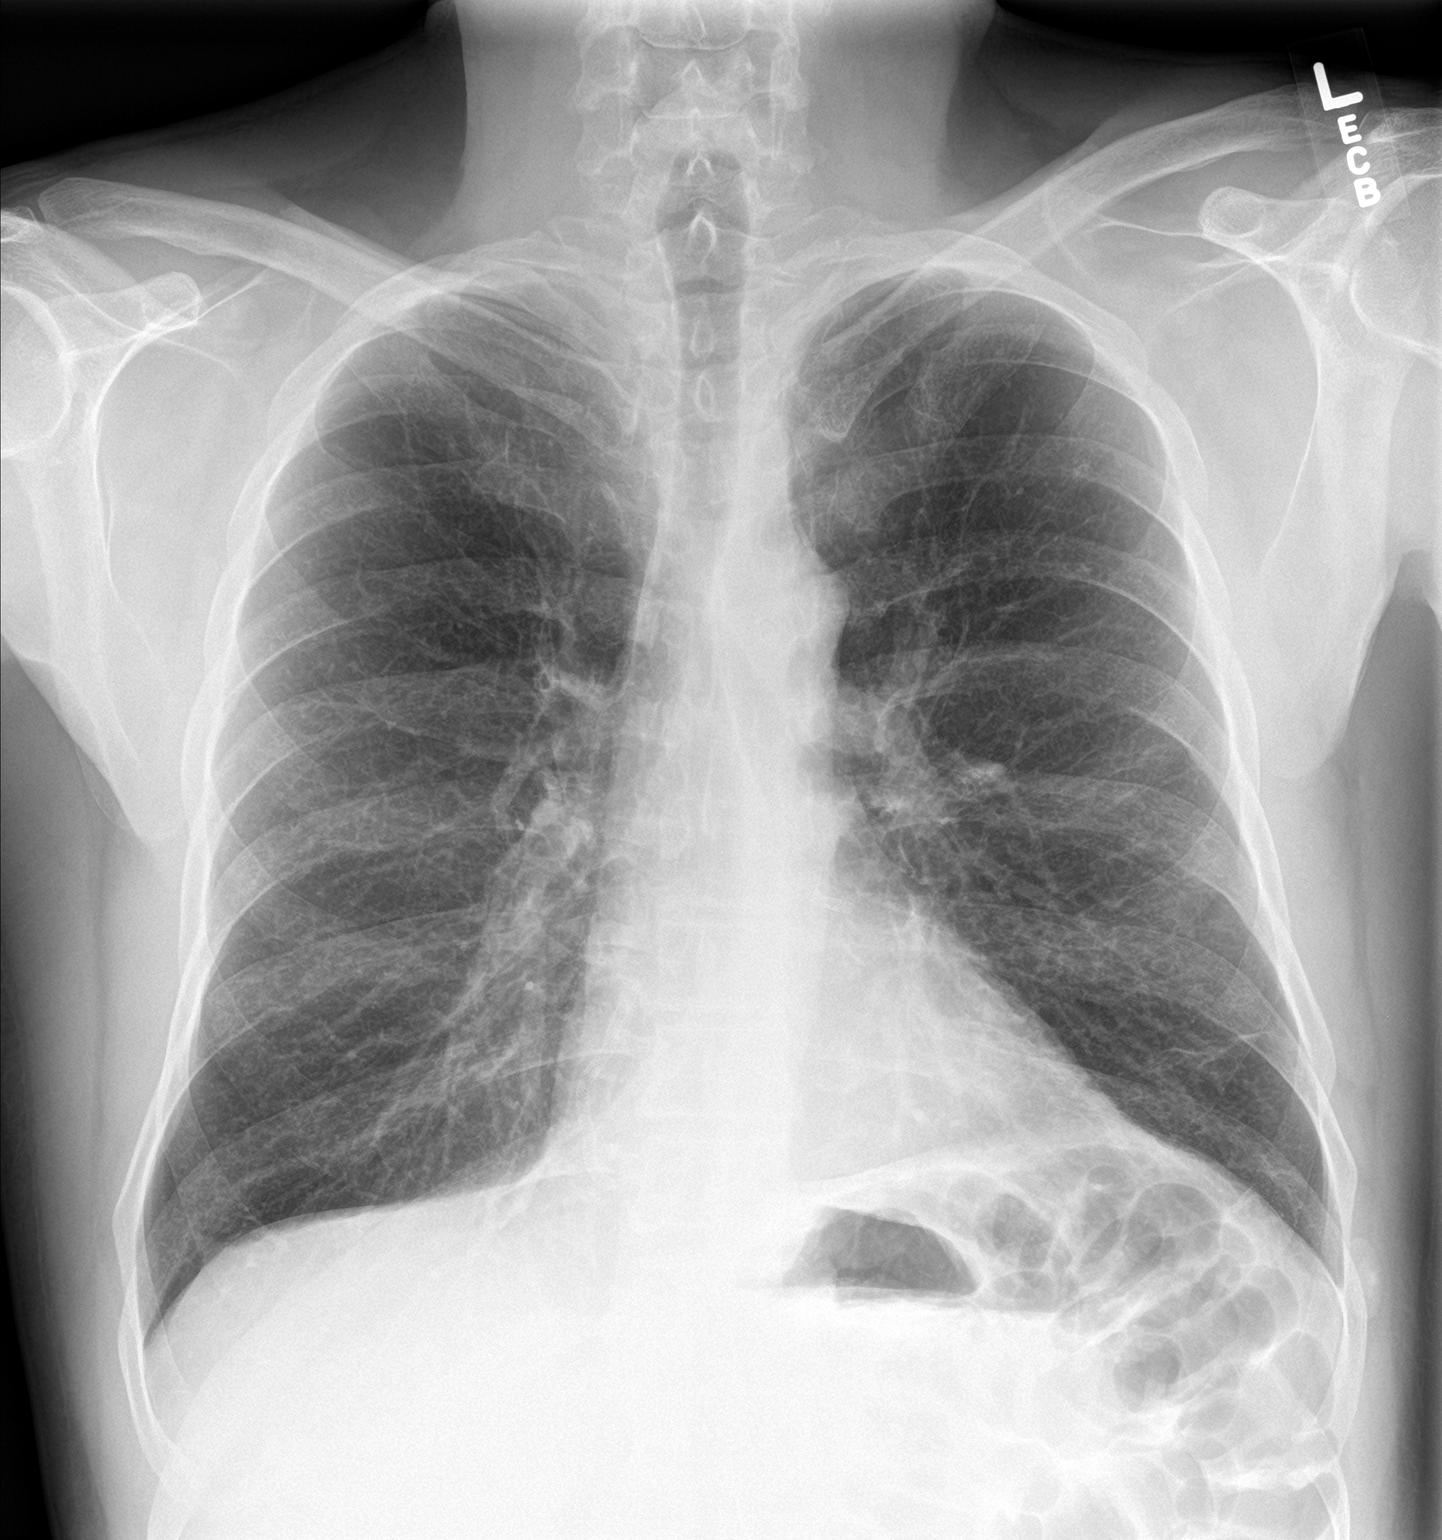

[chest lat]
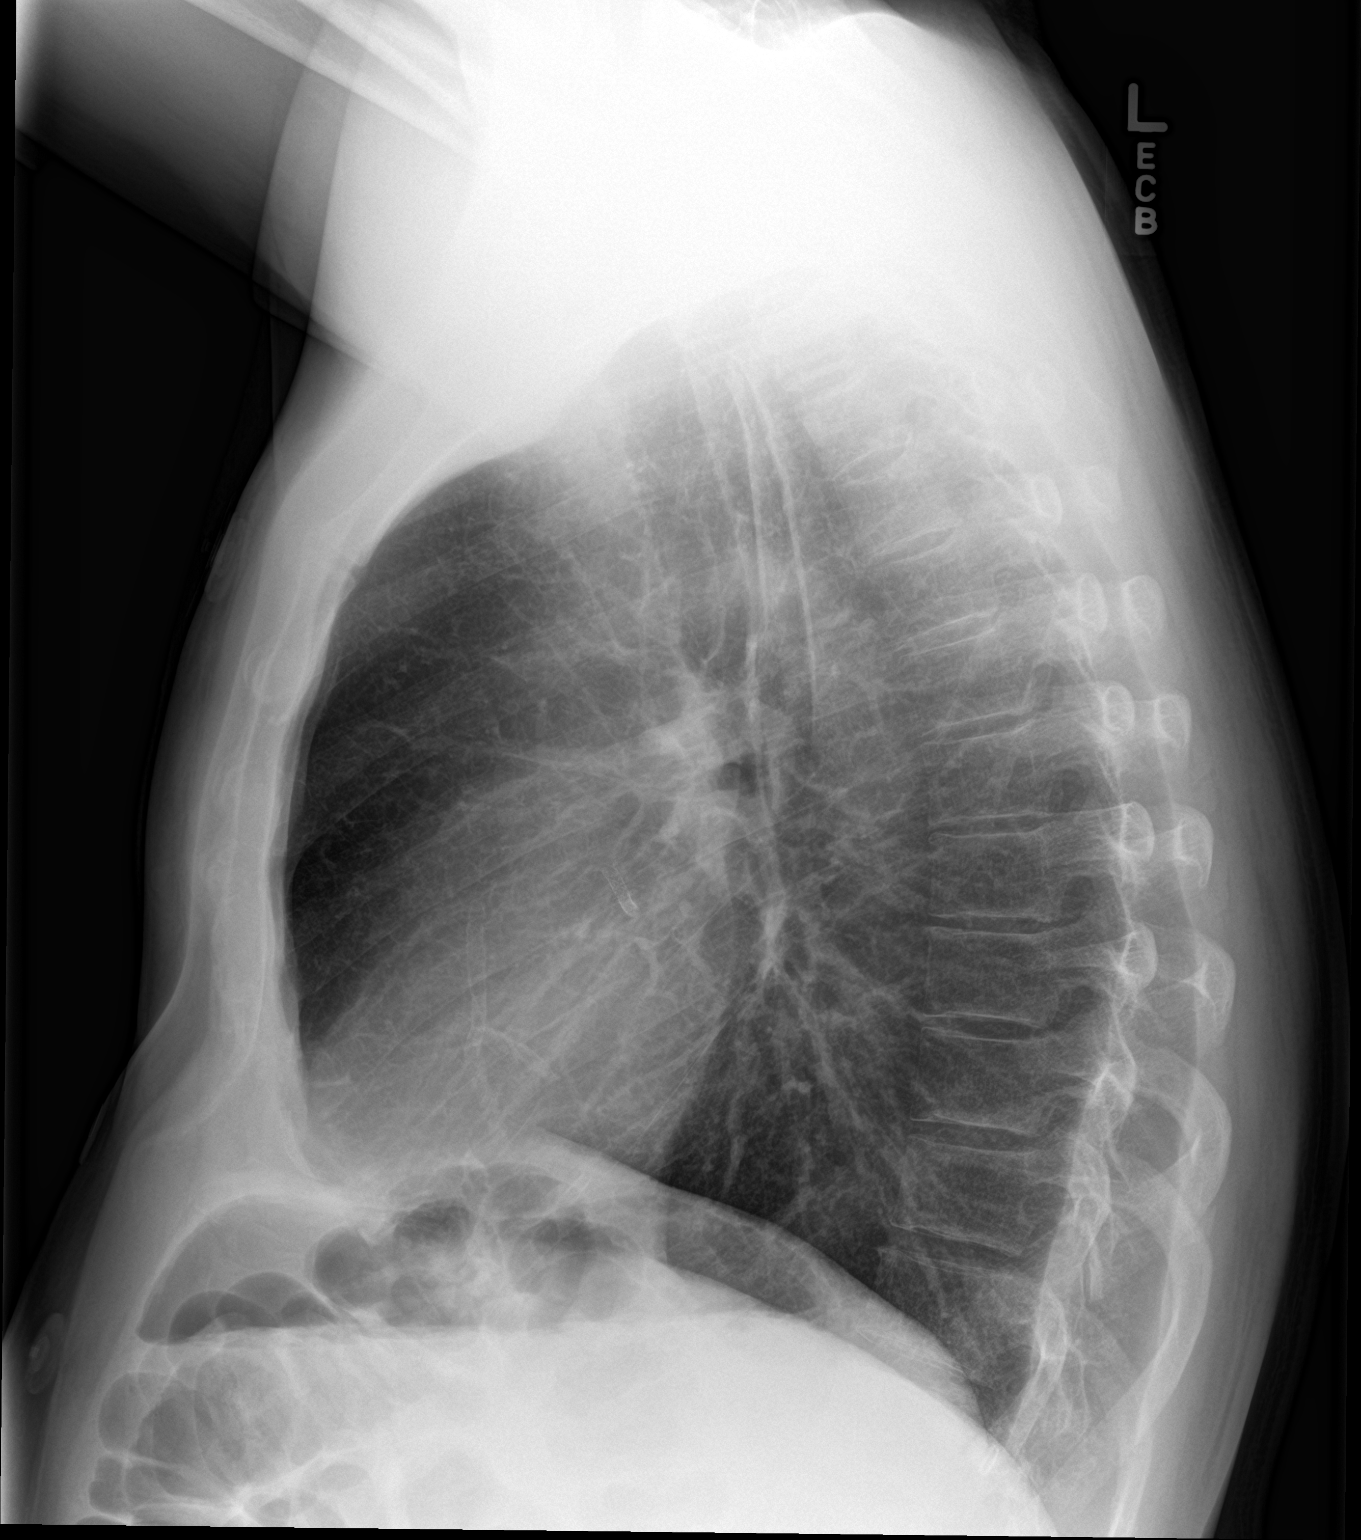

[2 of 2 positions shown; findings below may reference images not displayed]

FINDINGS: The heart size and mediastinal contours are within normal limits.
Coronary artery stents noted.

Mild scarring noted within the lingula. No evidence of pulmonary
infiltrate or edema. Pulmonary hyperinflation noted. No evidence of
pneumothorax or pleural effusion.
IMPRESSION: Pulmonary hyperinflation and mild lingular scarring. No active
cardiopulmonary disease.

## 2016-11-14 LAB — CULTURE, BLOOD (ROUTINE X 2): CULTURE: NO GROWTH

## 2016-11-15 ENCOUNTER — Encounter: Payer: Self-pay | Admitting: *Deleted

## 2016-11-15 NOTE — Telephone Encounter (Signed)
No answer

## 2016-12-16 ENCOUNTER — Telehealth: Payer: Self-pay | Admitting: Cardiology

## 2016-12-16 ENCOUNTER — Encounter (HOSPITAL_COMMUNITY): Payer: Self-pay

## 2016-12-16 ENCOUNTER — Ambulatory Visit (INDEPENDENT_AMBULATORY_CARE_PROVIDER_SITE_OTHER): Payer: Medicare HMO | Admitting: Cardiology

## 2016-12-16 ENCOUNTER — Encounter: Payer: Self-pay | Admitting: *Deleted

## 2016-12-16 ENCOUNTER — Emergency Department (HOSPITAL_COMMUNITY)
Admission: EM | Admit: 2016-12-16 | Discharge: 2016-12-16 | Disposition: A | Discharge status: Home / Self Care | Payer: Medicare HMO | Source: Home / Self Care | Attending: Physician Assistant | Admitting: Physician Assistant

## 2016-12-16 ENCOUNTER — Encounter: Payer: Self-pay | Admitting: Cardiology

## 2016-12-16 ENCOUNTER — Emergency Department (HOSPITAL_COMMUNITY): Payer: Medicare HMO

## 2016-12-16 VITALS — BP 150/100 | HR 102 | Ht 72.0 in | Wt 192.0 lb

## 2016-12-16 DIAGNOSIS — Z955 Presence of coronary angioplasty implant and graft: Secondary | ICD-10-CM

## 2016-12-16 DIAGNOSIS — E7800 Pure hypercholesterolemia, unspecified: Secondary | ICD-10-CM

## 2016-12-16 DIAGNOSIS — F17210 Nicotine dependence, cigarettes, uncomplicated: Secondary | ICD-10-CM

## 2016-12-16 DIAGNOSIS — I10 Essential (primary) hypertension: Secondary | ICD-10-CM

## 2016-12-16 DIAGNOSIS — Z7901 Long term (current) use of anticoagulants: Secondary | ICD-10-CM

## 2016-12-16 DIAGNOSIS — Z01812 Encounter for preprocedural laboratory examination: Secondary | ICD-10-CM | POA: Diagnosis not present

## 2016-12-16 DIAGNOSIS — R079 Chest pain, unspecified: Secondary | ICD-10-CM

## 2016-12-16 DIAGNOSIS — I25110 Atherosclerotic heart disease of native coronary artery with unstable angina pectoris: Secondary | ICD-10-CM | POA: Diagnosis not present

## 2016-12-16 DIAGNOSIS — Z8673 Personal history of transient ischemic attack (TIA), and cerebral infarction without residual deficits: Secondary | ICD-10-CM | POA: Insufficient documentation

## 2016-12-16 DIAGNOSIS — I252 Old myocardial infarction: Secondary | ICD-10-CM | POA: Insufficient documentation

## 2016-12-16 DIAGNOSIS — R0789 Other chest pain: Secondary | ICD-10-CM | POA: Diagnosis not present

## 2016-12-16 DIAGNOSIS — I2510 Atherosclerotic heart disease of native coronary artery without angina pectoris: Secondary | ICD-10-CM

## 2016-12-16 DIAGNOSIS — F410 Panic disorder [episodic paroxysmal anxiety] without agoraphobia: Secondary | ICD-10-CM

## 2016-12-16 DIAGNOSIS — F419 Anxiety disorder, unspecified: Secondary | ICD-10-CM

## 2016-12-16 DIAGNOSIS — Z9861 Coronary angioplasty status: Secondary | ICD-10-CM

## 2016-12-16 LAB — CBC
HCT: 42.7 % (ref 39.0–52.0)
HEMOGLOBIN: 14.5 g/dL (ref 13.0–17.0)
MCH: 31.9 pg (ref 26.0–34.0)
MCHC: 34 g/dL (ref 30.0–36.0)
MCV: 94.1 fL (ref 78.0–100.0)
PLATELETS: 480 10*3/uL — AB (ref 150–400)
RBC: 4.54 MIL/uL (ref 4.22–5.81)
RDW: 14.1 % (ref 11.5–15.5)
WBC: 9.6 10*3/uL (ref 4.0–10.5)

## 2016-12-16 LAB — BASIC METABOLIC PANEL
ANION GAP: 8 (ref 5–15)
BUN: 7 mg/dL (ref 6–20)
CALCIUM: 9.8 mg/dL (ref 8.9–10.3)
CHLORIDE: 105 mmol/L (ref 101–111)
CO2: 28 mmol/L (ref 22–32)
CREATININE: 0.98 mg/dL (ref 0.61–1.24)
GFR calc non Af Amer: >60 mL/min (ref >60)
Glucose, Bld: 82 mg/dL (ref 65–99)
Potassium: 4.5 mmol/L (ref 3.5–5.1)
SODIUM: 141 mmol/L (ref 135–145)

## 2016-12-16 LAB — I-STAT TROPONIN, ED: TROPONIN I, POC: 0 ng/mL (ref 0.00–0.08)

## 2016-12-16 MED ORDER — ALPRAZOLAM 0.5 MG PO TABS: 0.5000 mg | ORAL_TABLET | Freq: Every evening | ORAL | 0 refills | Status: DC | PRN

## 2016-12-16 NOTE — ED Provider Notes (Signed)
[name] Provider Note   CSN: [id number] Arrival date & time: 12/16/16  1205     History   Chief Complaint Chief Complaint  Patient presents with  . Chest Pain    HPI Aaron Dodson is a 51 y.o. male.  HPI   Pt is a 51 year old male presenting with CP since last hospilization 1 month ago. Patient reports it is contstant but occasionally gets wrose. Pt is concnered because he is also having panic attacks. He reports this is what most concerns him today. He has had two today and one yesterday- SOB, and anxiety. Pt reports he was on xanax for 10 years, recently stopped taking it.  Pt says the Eliquis usually helps his pain.  Patient has had extensive cardiac past workup in the past. Patient had multiple hospitalizations in the last several months for chest pain. His most recent hospitalization he refused stress test because he was worried about his anxiety and shortness of breath.   Patient is a patient of Dr. Marlou Porch. He called the office today and has a follow-up appointment today at 3:30 in 1 hour.  Patient denies any chest pain that sounds ischemic in nature-no diaphoresis no exertional component no radiation to arms or neck.  Past Medical History:  Diagnosis Date  . ADD (attention deficit disorder)   . Anxiety   . Chronic lower back pain   . Club foot   . COPD (chronic obstructive pulmonary disease) ([place])   . Coronary artery disease    a. s/p multiple prior PCIs (LAD, LCx, OM1 - [place]; [place]) // b. LHC 1/17: LM 30, mLAD 20, dLAD stent patent, oLCx 40 within stent but o/w patent with 50-70 beyond stent (jailed), OM1 stent patent, oRCA stent patent with 20 ISR, EF 55-65 // c. Echo 1/17: EF 50-55, no RWMA, Gr 1 DD, mild AI  . Endocarditis    hx of IV drug abuse; tx At [facility] 2016  . GERD (gastroesophageal reflux disease)   . History of echocardiogram    Echo 11/09/16: mild LVH, EF 55-60, no RWMA, Gr 1 DD, mildly dilated aortic root (38 mm)  . Hyperlipidemia   .  Hypertension   . Myocardial infarction 2000's - ~ 04/2015   "I've had a total of 3" (11/08/2016)  . PAF (paroxysmal atrial fibrillation) (HCC)    on Eliquis  . PTSD (post-traumatic stress disorder)   . Pulmonary embolism ([place])    "I've had 2 or 3" (11/08/2016)  . 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 Squamous cell carcinoma of scalp   . Stroke [facility]) ~ 2005-2006 X 2   denies residual on 11/08/2016    Patient Active Problem List   Diagnosis Date Noted  . Coronary artery disease involving native coronary artery of native heart   . CAD s/p PCI 11/19/2015  . Anxiety state   . Chest pain 11/18/2015  . HLD (hyperlipidemia) 11/18/2015  . Essential hypertension 11/18/2015  . Tobacco abuse 11/18/2015    Past Surgical History:  Procedure Laterality Date  . ABOVE KNEE LEG AMPUTATION Left   . CARDIAC CATHETERIZATION  "several"  . CARDIAC CATHETERIZATION N/A 11/26/2015   Procedure: Left Heart Cath and Coronary Angiography;  Surgeon: Burnell Blanks, MD;  Location: [facility];  Service: Cardiovascular;  Laterality: N/A;  . CLUB FOOT RELEASE Left 07-18-67  . CORONARY ANGIOPLASTY  "several"  . CORONARY ANGIOPLASTY WITH STENT PLACEMENT  "several"   "total of 10  stents placed" (11/18/2015)  . LAPAROSCOPIC CHOLECYSTECTOMY    . ORTHOPEDIC SURGERY  1967-01-15-~ 2007   "total of 52 on my left leg; started w/club foot released"  . TONSILLECTOMY         Home Medications    Prior to Admission medications   Medication Sig Start Date End Date Taking? Authorizing Provider  ALPRAZolam Duanne Moron) 1 MG tablet Take 1 mg by mouth 3 (three) times daily as needed for anxiety or sleep.     Historical Provider, MD  apixaban (ELIQUIS) 5 MG TABS tablet Take 5 mg by mouth 2 (two) times daily.    Historical Provider, MD  clopidogrel (PLAVIX) 75 MG tablet Take 1 tablet (75 mg total) by mouth daily. 07/22/16   Jennifer Chahn-Yang Choi, DO  HYDROcodone-acetaminophen (NORCO) 10-325 MG  tablet Take 1 tablet by mouth 4 (four) times daily. scheduled    Historical Provider, MD  metoprolol succinate (TOPROL-XL) 25 MG 24 hr tablet Take 1 tablet (25 mg total) by mouth daily. 10/13/16   Jerline Pain, MD  rosuvastatin (CRESTOR) 5 MG tablet Take 5 mg by mouth at bedtime.     Historical Provider, MD  senna (SENOKOT) 8.6 MG TABS tablet Take 2 tablets (17.2 mg total) by mouth at bedtime. 11/09/16   Rushil Sherrye Payor, MD    Family History Family History  Problem Relation Age of Onset  . Hypertension Father   . Lung cancer Father   . Heart attack Father     Social History Social History  Substance Use Topics  . Smoking status: Current Every Day Smoker    Packs/day: 1.00    Years: 35.00    Types: Cigarettes  . Smokeless tobacco: Never Used     Comment: quit day is 11/08/16  . Alcohol use 0.0 oz/week     Comment: 11/08/2016 nothing since "~ 2011"     Allergies   Benadryl [diphenhydramine hcl]; Promethazine hcl; Augmentin [amoxicillin-pot clavulanate]; and Toradol [ketorolac tromethamine]   Review of Systems Review of Systems  Constitutional: Negative for activity change.  Respiratory: Positive for chest tightness and shortness of breath.   Cardiovascular: Negative for chest pain.  Gastrointestinal: Negative for abdominal pain.  Psychiatric/Behavioral: The patient is nervous/anxious.   All other systems reviewed and are negative.    Physical Exam Updated Vital Signs BP 145/98   Pulse 93   Temp 97.6 F (36.4 C) (Oral)   Resp 13   Ht 6' (1.829 m)   Wt 200 lb (90.7 kg)   SpO2 98%   BMI 27.12 kg/m   Physical Exam  Constitutional: He is oriented to person, place, and time. He appears well-nourished.  HENT:  Head: Normocephalic.  Eyes: Conjunctivae are normal.  Cardiovascular: Normal rate and regular rhythm.   No murmur heard. Pulmonary/Chest: Effort normal and breath sounds normal. No respiratory distress. He has no wheezes.  Musculoskeletal: Normal range of motion.  He exhibits no edema.  No LLE  Neurological: He is oriented to person, place, and time.  Skin: Skin is warm and dry. He is not diaphoretic.  Psychiatric: He has a normal mood and affect. His behavior is normal.     ED Treatments / Results  Labs (all labs ordered are listed, but only abnormal results are displayed) Labs Reviewed  CBC - Abnormal; Notable for the following:       Result Value   Platelets 480 (*)    All other components within normal limits  [place], ED  EKG  EKG Interpretation  Date/Time:  Thursday December 16 2016 12:10:09 EST Ventricular Rate:  100 PR Interval:    QRS Duration: 91 QT Interval:  335 QTC Calculation: 432 R Axis:   70 Text Interpretation:  Sinus tachycardia Normal sinus rhythm No significant change since last tracing Confirmed by Gerald Leitz ([id number]) on 12/16/2016 1:57:23 PM       Radiology Dg Chest 2 View  Result Date: 12/16/2016 CLINICAL DATA:  LEFT chest pain worse for 1 day, history coronary artery disease post MI and coronary stenting, hypertension, COPD, smoking EXAM: CHEST  2 VIEW COMPARISON:  11/08/2016 FINDINGS: Normal heart size, mediastinal contours, and pulmonary vascularity. Coronary arterial stents noted. Chronic bronchitic changes without pulmonary infiltrate, pleural effusion or pneumothorax. No acute osseous findings. IMPRESSION: Mild chronic bronchitic changes without infiltrate. Electronically Signed   By: Lavonia Dana M.D.   On: 12/16/2016 12:34    Procedures Procedures (including critical care time)  Medications Ordered in ED Medications - No data to display   Initial Impression / Assessment and Plan / ED Course  I have reviewed the triage vital signs and the nursing notes.  Pertinent labs & imaging results that were available during my care of the patient were reviewed by me and considered in my medical decision making (see chart for details).     Patient is a 51 year old male  with extensive cardiac history followed by Dr. Gypsy Balsam. Patient had multiple auscultations last several months for chest pain. He often refuses stress test. He is right now "in between primary care physicians". Because he did not agree with his last primary care's decision-making. Today he reports she's had ongoing chest pain since the last hospitalization. He says it's been going on for several months. He says he is most concerned about the panic attacks he was having today. He had 2 prior to arrival. He reports he had one in the hospital as well.  I would like him to follow up with a specialist, cardiology. He has an appointment today in 1 hour. We've drawn labs. The EKG is nonischemic. If the lab work comes back negative I think I will try to get patient over to the office today. I'll give him a couple pills of Xanax to help with his anxiety attacks which appeared to be the main reason for him coming today.  Final Clinical Impressions(s) / ED Diagnoses   Final diagnoses:  None    New Prescriptions New Prescriptions   No medications on file     Johnrobert Foti Julio Alm, MD 12/16/16 1437

## 2016-12-16 NOTE — Patient Instructions (Signed)
Medication Instructions:  No changes  Labwork: INR today  Testing/Procedures: Your physician has requested that you have a cardiac catheterization. Cardiac catheterization is used to diagnose and/or treat various heart conditions. Doctors may recommend this procedure for a number of different reasons. The most common reason is to evaluate chest pain. Chest pain can be a symptom of coronary artery disease (CAD), and cardiac catheterization can show whether plaque is narrowing or blocking your heart's arteries. This procedure is also used to evaluate the valves, as well as measure the blood flow and oxygen levels in different parts of your heart. For further information please visit [email or web address]. Please follow instruction sheet, as given.    Follow-Up: Your physician recommends that you schedule a follow-up appointment in: 2 weeks with Dr. Marlou Porch or a PA or NP.    Any Other Special Instructions Will Be Listed Below (If Applicable).     If you need a refill on your cardiac medications before your next appointment, please call your pharmacy.

## 2016-12-16 NOTE — Discharge Instructions (Signed)
You were here for anxiety attacks and chest pain. You have an appointment within a half hour of with your cardiologist. We want you to follow up with them immediately.

## 2016-12-16 NOTE — ED Notes (Signed)
Labs obtained, but unable to get IV at this time.

## 2016-12-16 NOTE — ED Triage Notes (Signed)
Pt keeps chest pain.  Pt has 10 stents and on meds.  Pt with chest pain starting this morning at 8 am.  Shortness of breath. No cough/congestion

## 2016-12-16 NOTE — Telephone Encounter (Signed)
Received call directly from operator. Patient c/o 8 out of 10 chest pain that woke him up this morning at 0800.  He states he has been having chest pain intermittently since the first of the year and does not want to go back to the ER. His evaluation in the ER on 11/08/16 did not indicate cardiac chest pain so patient was advised to f/u with cardiology. He states he has a hx of anxiety and waking up with this pain is causing him increased anxiety. He denies pain currently; has not taken NTG due to severe headaches it causes. States he cannot walk a short distance without getting SOB. States he discussed with with Dr. Marlou Porch at appointment in Dec. Reports he recently quit smoking. Denies nausea; c/o "clamminess" when he wakes up. He requests appointment today if possible. I offered him appointment today at 3:30 with Cecilie Kicks, NP. He verbalized agreement with plan and thanked me for my help.

## 2016-12-16 NOTE — Progress Notes (Signed)
Cardiology Office Note   Date:  12/16/2016   ID:  Aaron Dodson, DOB 1965/12/03, MRN [id number]  PCP:  No PCP Per Patient  Cardiologist:  Dr. Marlou Porch    Chief Complaint  Patient presents with  . Chest Pain    just out of ER      History of Present Illness: Aaron Dodson is a 51 y.o. male who presents for chest pain.   He has a hx of CAD s/p remote PCI with stenting in [place] in the past (LAD, OM1 and RCA stents).  Last cardiac cath was done here at [facility] in 11/2015 after multiple presentations to the ED for chest pain.  LHC revealed nonobstructive CAD and medical therapy was recommended.  His other hx includes HTN, HL, prior stroke, prior IV drug abuse and endocarditis in 2016, chronic low back pain (takes opioids), anxiety and reported atrial fibrillation (although this has not been documented).  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    Pt was recently hospitalized for chest pain and per pt- nurse told him that cardiology was cancelling stress test.  He was stressed and decided he should leave. The chart stated he refused stress test.  Today he was seen in ER for chest pain.  Troponin was neg and EKG without acute changes.  Pt also noted to be out of xanax.  He had been taking for 10- years.   His pain is described as Lt ant chest goes through to his back.  Woke him from sleep this AM.  He is indeed anxious but he also has severe SOB when this occurs and he feels he will die.  Recent CTA for PE was neg for same symptoms.  ER gave him some xanax and had him come to his appt here.   He is is frustrated and feels he needs a cath.  The pain reminds him of previous pain before stents.    He is on Eliquis and no a fib during hospitalization.   He had blood cultures that was + for strept viridians thought to be a  contaminate.     Past Medical History:  Diagnosis Date  . ADD (attention deficit disorder)   . Anxiety    . Chronic lower back pain   . Club foot   . COPD (chronic obstructive pulmonary disease) ([place])   . Coronary artery disease    a. s/p multiple prior PCIs (LAD, LCx, OM1 - [place]; [place]) // b. LHC 1/17: LM 30, mLAD 20, dLAD stent patent, oLCx 40 within stent but o/w patent with 50-70 beyond stent (jailed), OM1 stent patent, oRCA stent patent with 20 ISR, EF 55-65 // c. Echo 1/17: EF 50-55, no RWMA, Gr 1 DD, mild AI  . Endocarditis    hx of IV drug abuse; tx At [facility] 2016  . GERD (gastroesophageal reflux disease)   . History of echocardiogram    Echo 11/09/16: mild LVH, EF 55-60, no RWMA, Gr 1 DD, mildly dilated aortic root (38 mm)  . Hyperlipidemia   . Hypertension   . Myocardial infarction 2000's - ~ 04/2015   "I've had a total of 3" (11/08/2016)  . PAF (paroxysmal atrial fibrillation) (HCC)    on Eliquis  . PTSD (post-traumatic stress disorder)   . Pulmonary embolism ([place])    "I've had 2 or 3" (11/08/2016)  . 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  Squamous cell carcinoma of scalp   . Stroke [facility]) ~ 2005-2006 X 2   denies residual on 11/08/2016    Past Surgical History:  Procedure Laterality Date  . ABOVE KNEE LEG AMPUTATION Left   . CARDIAC CATHETERIZATION  "several"  . CARDIAC CATHETERIZATION N/A 11/26/2015   Procedure: Left Heart Cath and Coronary Angiography;  Surgeon: Burnell Blanks, MD;  Location: [facility];  Service: Cardiovascular;  Laterality: N/A;  . CLUB FOOT RELEASE Left 02-12-67  . CORONARY ANGIOPLASTY  "several"  . CORONARY ANGIOPLASTY WITH STENT PLACEMENT  "several"   "total of 10 stents placed" (11/18/2015)  . LAPAROSCOPIC CHOLECYSTECTOMY    . ORTHOPEDIC SURGERY  Jun 08, 1967-~ 2007   "total of 52 on my left leg; started w/club foot released"  . TONSILLECTOMY       Current Outpatient Prescriptions  Medication Sig Dispense Refill  . ALPRAZolam (XANAX) 0.5 MG tablet Take 1 tablet (0.5 mg total) by mouth at bedtime as needed  for anxiety. 5 tablet 0  . ALPRAZolam (XANAX) 1 MG tablet Take 1 mg by mouth 3 (three) times daily as needed for anxiety or sleep.     Marland Kitchen apixaban (ELIQUIS) 5 MG TABS tablet Take 5 mg by mouth 2 (two) times daily.    . clopidogrel (PLAVIX) 75 MG tablet Take 1 tablet (75 mg total) by mouth daily. 30 tablet 0  . HYDROcodone-acetaminophen (NORCO) 10-325 MG tablet Take 1 tablet by mouth 4 (four) times daily. scheduled    . metoprolol succinate (TOPROL-XL) 25 MG 24 hr tablet Take 1 tablet (25 mg total) by mouth daily. 30 tablet 6  . rosuvastatin (CRESTOR) 5 MG tablet Take 5 mg by mouth at bedtime.     . senna (SENOKOT) 8.6 MG TABS tablet Take 2 tablets (17.2 mg total) by mouth at bedtime. 120 each 0   No current facility-administered medications for this visit.     Allergies:   Benadryl [diphenhydramine hcl]; Promethazine hcl; Augmentin [amoxicillin-pot clavulanate]; and Toradol [ketorolac tromethamine]    Social History:  The patient  reports that he has been smoking Cigarettes.  He has a 35.00 pack-year smoking history. He has never used smokeless tobacco. He reports that he drinks alcohol. He reports that he uses drugs.   Family History:  The patient's family history includes Heart attack in his father; Hypertension in his father; Lung cancer in his father.    ROS:  General:no colds or fevers, no weight changes Skin:no rashes or ulcers HEENT:no blurred vision, no congestion CV:see HPI PUL:see HPI GI:no diarrhea constipation or melena, no indigestion GU:no hematuria, no dysuria MS:no joint pain, no claudication Neuro:no syncope, no lightheadedness Endo:no diabetes, no thyroid disease  Wt Readings from Last 3 Encounters:  12/16/16 192 lb (87.1 kg)  12/16/16 200 lb (90.7 kg)  11/09/16 188 lb 11.2 oz (85.6 kg)     PHYSICAL EXAM: VS:  BP (!) 150/100 (BP Location: Right Arm, Patient Position: Sitting, Cuff Size: Normal)   Pulse (!) 102   Ht 6' (1.829 m)   Wt 192 lb (87.1 kg)   SpO2  98%   BMI 26.04 kg/m  , BMI Body mass index is 26.04 kg/m. General:Pleasant affect, NAD Skin:Warm and dry, brisk capillary refill HEENT:normocephalic, sclera clear, mucus membranes moist Neck:supple, no JVD, no bruits  Heart:S1S2 RRR without murmur, gallup, rub or click Lungs:clear without rales, rhonchi, or wheezes [id number], non tender, + BS, do not palpate liver spleen or masses Ext:no lower ext edema on Rt -  lt with prosthesis, 2+ radial pulses Neuro:alert and oriented X 3, MAE, follows commands, + facial symmetry    EKG:  EKG is NOT ordered today. The ekg from ER reviewed and no acute changes.   Recent Labs: 11/09/2016: ALT 12 12/16/2016: BUN 7; Creatinine, Ser 0.98; Hemoglobin 14.5; Platelets 480; Potassium 4.5; Sodium 141    Lipid Panel No results found for: CHOL, TRIG, HDL, CHOLHDL, VLDL, LDLCALC, LDLDIRECT     Other studies Reviewed: Additional studies/ records that were reviewed today include:  Echo 11/09/16 Study Conclusions  - Left ventricle: The cavity size was normal. Wall thickness was   increased in a pattern of mild LVH. Systolic function was normal.   The estimated ejection fraction was in the range of 55% to 60%.   Wall motion was normal; there were no regional wall motion   abnormalities. Doppler parameters are consistent with abnormal   left ventricular relaxation (grade 1 diastolic dysfunction). - Aortic valve: Poorly visualized. Mildly calcified leaflets. There   was no stenosis. Mean gradient (S): 8 mm Hg. - Aorta: Mildly dilated aortic root. Aortic root dimension: 38 mm   (ED). - Mitral valve: There was no significant regurgitation. - Right ventricle: The cavity size was normal. Systolic function   was normal. - Pulmonary arteries: No complete TR doppler jet so unable to   estimate PA systolic pressure. - Inferior vena cava: The vessel was normal in size. The   respirophasic diameter changes were in the normal range (>= 50%),   consistent with  normal central venous pressure.  Impressions:  - Normal LV size with mild LV hypertrophy. EF 55-60%. Normal RV   size and systolic function. No significant valvular abnormality   (aortic valve poorly visualized but no doppler evidence for   significant stenosis or regurgitation).  Cardiac cath 11/2015 Procedures   Left Heart Cath and Coronary Angiography  Conclusion   1. Triple vessel CAD 2. The LAD has mild plaque in the proximal segment and a patent stent in the distal/apical segment without restenosis.  3. The Circumflex has an ostial stent that extends into OM1. There is 40% ostial narrowing within the stent. The continuation of the AV groove Circumflex beyond OM1 is jailed by the stent with 50-70% stenosis. This continuation branch of the AV groove Circumflex is small in caliber.  4. The RCA has patent stents extending from the ostium of the vessel down into the PDA. There is diffuse mild stent restenosis.  5. The left main has a distal 30% stenosis.  6. Normal LV systolic function  Recommendations: Continue medical management      ASSESSMENT AND PLAN:  1. Chest pain - with his CAD and increasing anxiety after discussing with Dr. Johnsie Cancel will proceed with cardiac cath.  He will hold Eliquis Sat and Sun and cath on Monday.   The patient understands that risks included but are not limited to stroke (1 in 1000), death (1 in 13), kidney failure [usually temporary] (1 in 500), bleeding (1 in 200), allergic reaction [possibly serious] (1 in 200).   2.  HTN elevated but in hospital on 50 of toprol his BP was in the [id number] systolic.   3.  HLD on crestor  4.  Anxiety he needs PCP but for now will do cath then treatment for anxiety.    5.  abormal blood culture, will redraw day of cath and ask Dr.Molt to follow      Current medicines are reviewed with the patient today.  The patient Has no concerns regarding medicines.  The following changes have been made:  See above Labs/  tests ordered today include:see above  Disposition:   FU:  see above  Signed, Cecilie Kicks, NP  12/16/2016 5:15 PM    [facility] [place], [place], [place] Phone: [phone]; Fax: [phone]

## 2016-12-16 NOTE — Telephone Encounter (Signed)
New Message:   Please call,pt have been having chest pains off and off. When he have the pains it is so  Intense it throws him into an anxiety attack.

## 2016-12-17 ENCOUNTER — Inpatient Hospital Stay (HOSPITAL_COMMUNITY)
Admission: EM | Admit: 2016-12-17 | Discharge: 2016-12-20 | DRG: 287 | Disposition: A | Discharge status: Home / Self Care | Payer: Medicare HMO | Attending: Cardiovascular Disease | Admitting: Cardiovascular Disease

## 2016-12-17 ENCOUNTER — Emergency Department (HOSPITAL_COMMUNITY): Payer: Medicare HMO

## 2016-12-17 ENCOUNTER — Encounter (HOSPITAL_COMMUNITY): Payer: Self-pay | Admitting: *Deleted

## 2016-12-17 DIAGNOSIS — Z8249 Family history of ischemic heart disease and other diseases of the circulatory system: Secondary | ICD-10-CM | POA: Diagnosis not present

## 2016-12-17 DIAGNOSIS — F17210 Nicotine dependence, cigarettes, uncomplicated: Secondary | ICD-10-CM | POA: Diagnosis present

## 2016-12-17 DIAGNOSIS — I252 Old myocardial infarction: Secondary | ICD-10-CM | POA: Diagnosis not present

## 2016-12-17 DIAGNOSIS — Z7902 Long term (current) use of antithrombotics/antiplatelets: Secondary | ICD-10-CM

## 2016-12-17 DIAGNOSIS — Z955 Presence of coronary angioplasty implant and graft: Secondary | ICD-10-CM

## 2016-12-17 DIAGNOSIS — Z86711 Personal history of pulmonary embolism: Secondary | ICD-10-CM

## 2016-12-17 DIAGNOSIS — I480 Paroxysmal atrial fibrillation: Secondary | ICD-10-CM

## 2016-12-17 DIAGNOSIS — M545 Low back pain: Secondary | ICD-10-CM | POA: Diagnosis present

## 2016-12-17 DIAGNOSIS — J449 Chronic obstructive pulmonary disease, unspecified: Secondary | ICD-10-CM | POA: Diagnosis present

## 2016-12-17 DIAGNOSIS — I25110 Atherosclerotic heart disease of native coronary artery with unstable angina pectoris: Secondary | ICD-10-CM | POA: Diagnosis present

## 2016-12-17 DIAGNOSIS — Z801 Family history of malignant neoplasm of trachea, bronchus and lung: Secondary | ICD-10-CM | POA: Diagnosis not present

## 2016-12-17 DIAGNOSIS — F17200 Nicotine dependence, unspecified, uncomplicated: Secondary | ICD-10-CM | POA: Diagnosis present

## 2016-12-17 DIAGNOSIS — Z7901 Long term (current) use of anticoagulants: Secondary | ICD-10-CM | POA: Diagnosis not present

## 2016-12-17 DIAGNOSIS — G8929 Other chronic pain: Secondary | ICD-10-CM | POA: Diagnosis present

## 2016-12-17 DIAGNOSIS — I200 Unstable angina: Secondary | ICD-10-CM | POA: Diagnosis not present

## 2016-12-17 DIAGNOSIS — Z89612 Acquired absence of left leg above knee: Secondary | ICD-10-CM | POA: Diagnosis not present

## 2016-12-17 DIAGNOSIS — Z85828 Personal history of other malignant neoplasm of skin: Secondary | ICD-10-CM | POA: Diagnosis not present

## 2016-12-17 DIAGNOSIS — Z9049 Acquired absence of other specified parts of digestive tract: Secondary | ICD-10-CM | POA: Diagnosis not present

## 2016-12-17 DIAGNOSIS — F4310 Post-traumatic stress disorder, unspecified: Secondary | ICD-10-CM | POA: Diagnosis present

## 2016-12-17 DIAGNOSIS — Z79891 Long term (current) use of opiate analgesic: Secondary | ICD-10-CM

## 2016-12-17 DIAGNOSIS — Z881 Allergy status to other antibiotic agents status: Secondary | ICD-10-CM

## 2016-12-17 DIAGNOSIS — Z8673 Personal history of transient ischemic attack (TIA), and cerebral infarction without residual deficits: Secondary | ICD-10-CM | POA: Diagnosis not present

## 2016-12-17 DIAGNOSIS — K219 Gastro-esophageal reflux disease without esophagitis: Secondary | ICD-10-CM | POA: Diagnosis present

## 2016-12-17 DIAGNOSIS — R0789 Other chest pain: Secondary | ICD-10-CM | POA: Diagnosis present

## 2016-12-17 DIAGNOSIS — Z885 Allergy status to narcotic agent status: Secondary | ICD-10-CM

## 2016-12-17 DIAGNOSIS — Z79899 Other long term (current) drug therapy: Secondary | ICD-10-CM | POA: Diagnosis not present

## 2016-12-17 DIAGNOSIS — I2510 Atherosclerotic heart disease of native coronary artery without angina pectoris: Secondary | ICD-10-CM | POA: Diagnosis present

## 2016-12-17 DIAGNOSIS — I10 Essential (primary) hypertension: Secondary | ICD-10-CM | POA: Diagnosis present

## 2016-12-17 DIAGNOSIS — Z888 Allergy status to other drugs, medicaments and biological substances status: Secondary | ICD-10-CM

## 2016-12-17 DIAGNOSIS — F988 Other specified behavioral and emotional disorders with onset usually occurring in childhood and adolescence: Secondary | ICD-10-CM | POA: Diagnosis present

## 2016-12-17 DIAGNOSIS — F411 Generalized anxiety disorder: Secondary | ICD-10-CM | POA: Diagnosis present

## 2016-12-17 DIAGNOSIS — E782 Mixed hyperlipidemia: Secondary | ICD-10-CM | POA: Diagnosis not present

## 2016-12-17 DIAGNOSIS — E785 Hyperlipidemia, unspecified: Secondary | ICD-10-CM | POA: Diagnosis present

## 2016-12-17 DIAGNOSIS — R079 Chest pain, unspecified: Secondary | ICD-10-CM

## 2016-12-17 LAB — COMPREHENSIVE METABOLIC PANEL
ALT: 13 U/L — AB (ref 17–63)
ANION GAP: 13 (ref 5–15)
AST: 19 U/L (ref 15–41)
Albumin: 3.8 g/dL (ref 3.5–5.0)
Alkaline Phosphatase: 98 U/L (ref 38–126)
BUN: 8 mg/dL (ref 6–20)
CHLORIDE: 103 mmol/L (ref 101–111)
CO2: 22 mmol/L (ref 22–32)
CREATININE: 1.08 mg/dL (ref 0.61–1.24)
Calcium: 9.8 mg/dL (ref 8.9–10.3)
Glucose, Bld: 104 mg/dL — ABNORMAL HIGH (ref 65–99)
POTASSIUM: 3.8 mmol/L (ref 3.5–5.1)
SODIUM: 138 mmol/L (ref 135–145)
Total Bilirubin: 0.5 mg/dL (ref 0.3–1.2)
Total Protein: 7.1 g/dL (ref 6.5–8.1)

## 2016-12-17 LAB — CBC WITH DIFFERENTIAL/PLATELET
Basophils Absolute: 0 10*3/uL (ref 0.0–0.1)
Basophils Relative: 0 %
EOS ABS: 0.2 10*3/uL (ref 0.0–0.7)
Eosinophils Relative: 2 %
HCT: 41.8 % (ref 39.0–52.0)
Hemoglobin: 14.2 g/dL (ref 13.0–17.0)
LYMPHS ABS: 2.9 10*3/uL (ref 0.7–4.0)
LYMPHS PCT: 30 %
MCH: 32.1 pg (ref 26.0–34.0)
MCHC: 34 g/dL (ref 30.0–36.0)
MCV: 94.6 fL (ref 78.0–100.0)
Monocytes Absolute: 0.6 10*3/uL (ref 0.1–1.0)
Monocytes Relative: 6 %
Neutro Abs: 5.9 10*3/uL (ref 1.7–7.7)
Neutrophils Relative %: 62 %
PLATELETS: 452 10*3/uL — AB (ref 150–400)
RBC: 4.42 MIL/uL (ref 4.22–5.81)
RDW: 14.3 % (ref 11.5–15.5)
WBC: 9.7 10*3/uL (ref 4.0–10.5)

## 2016-12-17 LAB — PROTIME-INR
INR: 1 (ref 0.8–1.2)
Prothrombin Time: 10.9 s (ref 9.1–12.0)

## 2016-12-17 LAB — TROPONIN I
Troponin I: <0.03 ng/mL (ref <0.03)
Troponin I: <0.03 ng/mL (ref <0.03)

## 2016-12-17 LAB — I-STAT TROPONIN, ED: TROPONIN I, POC: 0 ng/mL (ref 0.00–0.08)

## 2016-12-17 LAB — MAGNESIUM: Magnesium: 2 mg/dL (ref 1.7–2.4)

## 2016-12-17 LAB — APTT
APTT: 33 s (ref 24–36)
aPTT: 60 seconds — ABNORMAL HIGH (ref 24–36)

## 2016-12-17 LAB — T4, FREE: FREE T4: 1.07 ng/dL (ref 0.61–1.12)

## 2016-12-17 LAB — HEPARIN LEVEL (UNFRACTIONATED)

## 2016-12-17 LAB — TSH: TSH: 0.695 u[IU]/mL (ref 0.350–4.500)

## 2016-12-17 MED ORDER — ASPIRIN 300 MG RE SUPP: 300.0000 mg | RECTAL | Status: AC

## 2016-12-17 MED ORDER — ACETAMINOPHEN 325 MG PO TABS
650.0000 mg | ORAL_TABLET | ORAL | Status: DC | PRN
  Administered 2016-12-19 – 2016-12-20 (×2): 650 mg via ORAL
  Filled 2016-12-17 (×2): qty 2

## 2016-12-17 MED ORDER — NITROGLYCERIN 0.4 MG SL SUBL
0.4000 mg | SUBLINGUAL_TABLET | SUBLINGUAL | Status: DC | PRN
  Administered 2016-12-19 (×3): 0.4 mg via SUBLINGUAL
  Filled 2016-12-17: qty 1

## 2016-12-17 MED ORDER — ASPIRIN EC 81 MG PO TBEC
81.0000 mg | DELAYED_RELEASE_TABLET | Freq: Every day | ORAL | Status: DC
  Administered 2016-12-18 – 2016-12-19 (×2): 81 mg via ORAL
  Filled 2016-12-17 (×2): qty 1

## 2016-12-17 MED ORDER — HEPARIN (PORCINE) IN NACL 100-0.45 UNIT/ML-% IJ SOLN
1350.0000 [IU]/h | INTRAMUSCULAR | Status: DC
  Administered 2016-12-17: 1200 [IU]/h via INTRAVENOUS
  Administered 2016-12-19 (×2): 1350 [IU]/h via INTRAVENOUS
  Filled 2016-12-17 (×4): qty 250

## 2016-12-17 MED ORDER — SODIUM CHLORIDE 0.9 % IV SOLN: INTRAVENOUS | Status: DC

## 2016-12-17 MED ORDER — ALPRAZOLAM 0.5 MG PO TABS
0.5000 mg | ORAL_TABLET | Freq: Every evening | ORAL | Status: DC | PRN
  Administered 2016-12-17 – 2016-12-19 (×3): 0.5 mg via ORAL
  Filled 2016-12-17 (×3): qty 1

## 2016-12-17 MED ORDER — ZOLPIDEM TARTRATE 5 MG PO TABS
5.0000 mg | ORAL_TABLET | Freq: Every evening | ORAL | Status: DC | PRN
Start: 2016-12-17 — End: 2016-12-20

## 2016-12-17 MED ORDER — ONDANSETRON HCL 4 MG/2ML IJ SOLN: 4.0000 mg | Freq: Four times a day (QID) | INTRAMUSCULAR | Status: DC | PRN

## 2016-12-17 MED ORDER — ASPIRIN 81 MG PO CHEW
324.0000 mg | CHEWABLE_TABLET | ORAL | Status: AC
  Administered 2016-12-17: 324 mg via ORAL
  Filled 2016-12-17: qty 4

## 2016-12-17 MED ORDER — ROSUVASTATIN CALCIUM 10 MG PO TABS
5.0000 mg | ORAL_TABLET | Freq: Every day | ORAL | Status: DC
Start: 2016-12-17 — End: 2016-12-20
  Administered 2016-12-17 – 2016-12-19 (×3): 5 mg via ORAL
  Filled 2016-12-17 (×3): qty 1

## 2016-12-17 MED ORDER — HYDROCODONE-ACETAMINOPHEN 10-325 MG PO TABS
1.0000 | ORAL_TABLET | Freq: Four times a day (QID) | ORAL | Status: DC
  Administered 2016-12-17 – 2016-12-20 (×13): 1 via ORAL
  Filled 2016-12-17 (×13): qty 1

## 2016-12-17 MED ORDER — CLOPIDOGREL BISULFATE 75 MG PO TABS
75.0000 mg | ORAL_TABLET | Freq: Every day | ORAL | Status: DC
  Administered 2016-12-17 – 2016-12-20 (×4): 75 mg via ORAL
  Filled 2016-12-17 (×4): qty 1

## 2016-12-17 MED ORDER — METOPROLOL SUCCINATE ER 25 MG PO TB24
25.0000 mg | ORAL_TABLET | Freq: Every day | ORAL | Status: DC
  Administered 2016-12-18 – 2016-12-20 (×3): 25 mg via ORAL
  Filled 2016-12-17 (×4): qty 1

## 2016-12-17 MED ORDER — MORPHINE SULFATE (PF) 4 MG/ML IV SOLN
2.0000 mg | INTRAVENOUS | Status: DC | PRN
  Administered 2016-12-17 – 2016-12-20 (×11): 2 mg via INTRAVENOUS
  Filled 2016-12-17 (×11): qty 1

## 2016-12-17 NOTE — Progress Notes (Signed)
ANTICOAGULATION CONSULT NOTE - Initial Consult  Pharmacy Consult for heparin Indication: chest pain/ACS  Allergies  Allergen Reactions  . Benadryl [Diphenhydramine Hcl] Other (See Comments)    Aggressive behavior  . Promethazine Hcl Other (See Comments)    Unknown - given for surgical procedure and advised to never take again  . Augmentin [Amoxicillin-Pot Clavulanate] Diarrhea and Nausea Only  . Toradol [Ketorolac Tromethamine] Hives and Other (See Comments)    Redness. Patient states he tolerated ibuprofen fine.     Patient Measurements: Height: 6' (182.9 cm) Weight: 200 lb (90.7 kg) IBW/kg (Calculated) : 77.6 Heparin Dosing Weight: 90 kg  Vital Signs: Temp: 98.4 F (36.9 C) (02/09 0550) Temp Source: Oral (02/09 0550) BP: 125/96 (02/09 1230) Pulse Rate: 81 (02/09 1230)  Labs:  Recent Labs  12/16/16 1355 12/16/16 1638 12/17/16 0542 12/17/16 0842  HGB 14.5  --  14.2  --   HCT 42.7  --  41.8  --   PLT 480*  --  452*  --   LABPROT  --  10.9  --   --   INR  --  1.0  --   --   CREATININE 0.98  --  1.08  --   TROPONINI  --   --  <0.03 <0.03    Estimated Creatinine Clearance: 89.8 mL/min (by C-G formula based on SCr of 1.08 mg/dL).  Assessment: 51 yo m presenting with CP (was in Dr. Angelena Form office yesterday after ER visit and still have CP)  PMG: CAD, 50+ orthopedic surgeries, hx of IVDU leading to IE, afib (on apixaban pta)  Anticoag: apixaban pta for afib - last dose 2/8 to hep gtt for ACS  ID: recent 1/2 viridans strep bcx - recheck  CV: originally planned for cath mon, now today  Renal: SCr 1.08  Heme/Onc: H&H 14.2/41.8, Plt 452  Goal of Therapy:  Heparin level 0.3-0.7 units/ml Monitor platelets by anticoagulation protocol: Yes   Plan:  BL aptt/HL prior to starting hep No bolus d/t recent apixaban Initiate hep gtt 1200 units/hr Initial level 2000  F/u post cath Daily HL, CBC if continues  Levester Fresh, PharmD, BCPS, BCCCP Clinical  Pharmacist Clinical phone for 12/17/2016 from 7a-3:30p: [phone] If after 3:30p, please call main pharmacy at: [phone] 12/17/2016 1:06 PM

## 2016-12-17 NOTE — Progress Notes (Signed)
[place] for heparin Indication: chest pain/ACS  Allergies  Allergen Reactions  . Benadryl [Diphenhydramine Hcl] Other (See Comments)    Aggressive behavior  . Promethazine Hcl Other (See Comments)    Unknown - given for surgical procedure and advised to never take again  . Augmentin [Amoxicillin-Pot Clavulanate] Diarrhea and Nausea Only  . Toradol [Ketorolac Tromethamine] Hives and Other (See Comments)    Redness. Patient states he tolerated ibuprofen fine.     Patient Measurements: Height: 6' (182.9 cm) Weight: 173 lb 11.6 oz (78.8 kg) (without prosthetic) IBW/kg (Calculated) : 77.6 Heparin Dosing Weight: 78 kg  Vital Signs: Temp: 98.4 F (36.9 C) (02/09 1650) Temp Source: Oral (02/09 1650) BP: 141/88 (02/09 1700) Pulse Rate: 92 (02/09 1650)  Labs:  Recent Labs  12/16/16 1355 12/16/16 1638  12/17/16 0542 12/17/16 0842 12/17/16 1324 12/17/16 1822 12/17/16 2043  HGB 14.5  --   --  14.2  --   --   --   --   HCT 42.7  --   --  41.8  --   --   --   --   PLT 480*  --   --  452*  --   --   --   --   APTT  --   --   --   --   --  33  --  60*  LABPROT  --  10.9  --   --   --   --   --   --   INR  --  1.0  --   --   --   --   --   --   HEPARINUNFRC  --   --   --   --   --  <0.10*  --   --   CREATININE 0.98  --   --  1.08  --   --   --   --   TROPONINI  --   --   < > <0.03 <0.03 <0.03 <0.03  --   < > = values in this interval not displayed.  Estimated Creatinine Clearance: 89.8 mL/min (by C-G formula based on SCr of 1.08 mg/dL).  Assessment: 51 yo m presenting with CP (was in Dr. 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    Goal of Therapy:  Heparin level 0.3-0.7 units/ml Monitor platelets by anticoagulation protocol:  Yes aPTT 66-102   Plan:  -Increase heparin to 1350 units/hr -Daily HL, CBC, aptt    Hughes Better, PharmD, BCPS Clinical Pharmacist 12/17/2016 9:39 PM

## 2016-12-17 NOTE — ED Notes (Signed)
EDP at bedside  

## 2016-12-17 NOTE — ED Notes (Signed)
RN attempted IV unsuccessfully. Will have another RN attempt.  

## 2016-12-17 NOTE — ED Triage Notes (Signed)
Patient presents with continued CP.  Was seen in Dr Ferd Glassing  (cardiologist office yesterday for pre op) and told to be seen in the ED if the pain continued

## 2016-12-17 NOTE — H&P (Signed)
Aaron Dodson is an 51 y.o. male.    Primary Cardiologist:Dr. Marlou Porch No PCP Per Patient  Chief Complaint: chest pain  HPI:   31 YOM I saw yesterday in the office after an ER visit.  He continues with chest pain.    He hasa hx of CAD s/p remote PCI with stenting in [place] in the past (LAD, OM1 and RCA stents). Last cardiac cath was done here [facility] in 11/2015 after multiple presentations to the ED for chest pain. LHC revealed nonobstructive CAD and medical therapy was recommended.  His other hx includes HTN, HL, prior stroke, prior IV drug abuse and endocarditis in 2016, chronic low back pain (takes opioids), anxiety and reported atrial fibrillation (although this has not been documented). 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   Pt was recently hospitalized for chest pain and per pt- nurse told him that cardiology was cancelling stress test.  He was stressed and decided he should leave. The chart stated he refused stress test.  Today he was seen in ER for chest pain.  Troponin was neg and EKG without acute changes.  Pt also noted to be out of xanax.  He had been taking for 10- years.   His pain is described as Lt ant chest goes through to his back.  Woke him from sleep this AM.  He is indeed anxious but he also has severe SOB when this occurs and he feels he will die.  Recent CTA for PE was neg for same symptoms.  ER gave him some xanax and had him come to his appt here.   He is is frustrated and feels he needs a cath.  The pain reminds him of previous pain before stents.    He is on Eliquis and no a fib during hospitalization.   He had blood cultures that was + for strept viridians thought to be a contaminate.  We were going to recheck on Monday.    We had planned for cath Monday as he took Eliquis yesterday AM.    Today he presents to ER with chest pain.  This time woke him at 2 or so AM  initially sharp pain then felt like his wife was sitting on Lt chest.  No nausea some SOB.  He took NTG without relief.  He went ahead and took a xanax and he did not have a panic attack.  He called our on call MD and was told to come to ER.   troponins are negative.  No acute CXR changes.  EKG ST at 110 and no acute changes.  Currently with some pain.  NTG gives terrible headaches.   He did not take his eliquis last pm.    Past Medical History:  Diagnosis Date  . ADD (attention deficit disorder)   . Anxiety   . Chronic lower back pain   . Club foot   . COPD (chronic obstructive pulmonary disease) ([place])   . Coronary artery disease    a. s/p multiple prior PCIs (LAD, LCx, OM1 - [place]; [facility]) // b. LHC 1/17: LM 30, mLAD 20, dLAD stent patent, oLCx 40 within stent but o/w patent with 50-70 beyond stent (jailed), OM1 stent patent, oRCA stent patent with 20 ISR, EF 55-65 // c. Echo 1/17: EF 50-55, no RWMA, Gr 1 DD, mild AI  . Endocarditis    hx of IV drug  abuse; tx At [facility] 2016  . GERD (gastroesophageal reflux disease)   . History of echocardiogram    Echo 11/09/16: mild LVH, EF 55-60, no RWMA, Gr 1 DD, mildly dilated aortic root (38 mm)  . Hyperlipidemia   . Hypertension   . Myocardial infarction 2000's - ~ 04/2015   "I've had a total of 3" (11/08/2016)  . PAF (paroxysmal atrial fibrillation) (HCC)    on Eliquis  . PTSD (post-traumatic stress disorder)   . Pulmonary embolism ([place])    "I've had 2 or 3" (11/08/2016)  . 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 Squamous cell carcinoma of scalp   . Stroke [facility]) ~ 2005-2006 X 2   denies residual on 11/08/2016    Past Surgical History:  Procedure Laterality Date  . ABOVE KNEE LEG AMPUTATION Left   . CARDIAC CATHETERIZATION  "several"  . CARDIAC CATHETERIZATION N/A 11/26/2015   Procedure: Left Heart Cath and Coronary Angiography;  Surgeon: Burnell Blanks, MD;  Location: [facility];  Service:  Cardiovascular;  Laterality: N/A;  . CLUB FOOT RELEASE Left 12/03/1966  . CORONARY ANGIOPLASTY  "several"  . CORONARY ANGIOPLASTY WITH STENT PLACEMENT  "several"   "total of 10 stents placed" (11/18/2015)  . LAPAROSCOPIC CHOLECYSTECTOMY    . ORTHOPEDIC SURGERY  07/10/67-~ 2007   "total of 52 on my left leg; started w/club foot released"  . TONSILLECTOMY      Family History  Problem Relation Age of Onset  . Hypertension Father   . Lung cancer Father   . Heart attack Father    Social History:  reports that he has been smoking Cigarettes.  He has a 35.00 pack-year smoking history. He has never used smokeless tobacco. He reports that he drinks alcohol. He reports that he does not use drugs.  Allergies:  Allergies  Allergen Reactions  . Benadryl [Diphenhydramine Hcl] Other (See Comments)    Aggressive behavior  . Promethazine Hcl Other (See Comments)    Unknown - given for surgical procedure and advised to never take again  . Augmentin [Amoxicillin-Pot Clavulanate] Diarrhea and Nausea Only  . Toradol [Ketorolac Tromethamine] Hives and Other (See Comments)    Redness. Patient states he tolerated ibuprofen fine.     OUTPATIENT MEDICATIONS: No current facility-administered medications on file prior to encounter.    Current Outpatient Prescriptions on File Prior to Encounter  Medication Sig Dispense Refill  . ALPRAZolam (XANAX) 0.5 MG tablet Take 1 tablet (0.5 mg total) by mouth at bedtime as needed for anxiety. 5 tablet 0  . apixaban (ELIQUIS) 5 MG TABS tablet Take 5 mg by mouth 2 (two) times daily.    . clopidogrel (PLAVIX) 75 MG tablet Take 1 tablet (75 mg total) by mouth daily. 30 tablet 0  . HYDROcodone-acetaminophen (NORCO) 10-325 MG tablet Take 1 tablet by mouth 4 (four) times daily. scheduled    . metoprolol succinate (TOPROL-XL) 25 MG 24 hr tablet Take 1 tablet (25 mg total) by mouth daily. 30 tablet 6  . rosuvastatin (CRESTOR) 5 MG tablet Take 5 mg by mouth at bedtime.     .  senna (SENOKOT) 8.6 MG TABS tablet Take 2 tablets (17.2 mg total) by mouth at bedtime. (Patient not taking: Reported on 12/17/2016) 120 each 0     Results for orders placed or performed during the hospital encounter of 12/17/16 (from the past 48 hour(s))  CBC with Differential     Status: Abnormal  Collection Time: 12/17/16  5:42 AM  Result Value Ref Range   WBC 9.7 4.0 - 10.5 K/uL   RBC 4.42 4.22 - 5.81 MIL/uL   Hemoglobin 14.2 13.0 - 17.0 g/dL   HCT 41.8 39.0 - 52.0 %   MCV 94.6 78.0 - 100.0 fL   MCH 32.1 26.0 - 34.0 pg   MCHC 34.0 30.0 - 36.0 g/dL   RDW 14.3 11.5 - 15.5 %   Platelets 452 (H) 150 - 400 K/uL   Neutrophils Relative % 62 %   Neutro Abs 5.9 1.7 - 7.7 K/uL   Lymphocytes Relative 30 %   Lymphs Abs 2.9 0.7 - 4.0 K/uL   Monocytes Relative 6 %   Monocytes Absolute 0.6 0.1 - 1.0 K/uL   Eosinophils Relative 2 %   Eosinophils Absolute 0.2 0.0 - 0.7 K/uL   Basophils Relative 0 %   Basophils Absolute 0.0 0.0 - 0.1 K/uL  Comprehensive metabolic panel     Status: Abnormal   Collection Time: 12/17/16  5:42 AM  Result Value Ref Range   Sodium 138 135 - 145 mmol/L   Potassium 3.8 3.5 - 5.1 mmol/L   Chloride 103 101 - 111 mmol/L   CO2 22 22 - 32 mmol/L   Glucose, Bld 104 (H) 65 - 99 mg/dL   BUN 8 6 - 20 mg/dL   Creatinine, Ser 1.08 0.61 - 1.24 mg/dL   Calcium 9.8 8.9 - 10.3 mg/dL   Total Protein 7.1 6.5 - 8.1 g/dL   Albumin 3.8 3.5 - 5.0 g/dL   AST 19 15 - 41 U/L   ALT 13 (L) 17 - 63 U/L   Alkaline Phosphatase 98 38 - 126 U/L   Total Bilirubin 0.5 0.3 - 1.2 mg/dL   GFR calc non Af Amer >60 >60 mL/min   GFR calc Af Amer >60 >60 mL/min    Comment: (NOTE) The eGFR has been calculated using the CKD EPI equation. This calculation has not been validated in all clinical situations. eGFR's persistently <60 mL/min signify possible Chronic Kidney Disease.    Anion gap 13 5 - 15  Troponin I     Status: None   Collection Time: 12/17/16  5:42 AM  Result Value Ref Range    Troponin I <0.03 <0.03 ng/mL  I-Stat Troponin, ED (not at [facility])     Status: None   Collection Time: 12/17/16  5:56 AM  Result Value Ref Range   Troponin i, poc 0.00 0.00 - 0.08 ng/mL   Comment 3            Comment: Due to the release kinetics of cTnI, a negative result within the first hours of the onset of symptoms does not rule out myocardial infarction with certainty. If myocardial infarction is still suspected, repeat the test at appropriate intervals.   Troponin I     Status: None   Collection Time: 12/17/16  8:42 AM  Result Value Ref Range   Troponin I <0.03 <0.03 ng/mL   Dg Chest 2 View  Result Date: 12/17/2016 CLINICAL DATA:  Continuing chest pain. Saw cardiologist yesterday and was told to come to ED if pain continued. Smoker. EXAM: CHEST  2 VIEW COMPARISON:  12/16/2016 FINDINGS: Emphysematous changes in the lungs. Central peribronchial thickening and interstitial changes consistent with chronic bronchitis. No focal airspace disease or consolidation. No blunting of costophrenic angles. No pneumothorax. Normal heart size and pulmonary vascularity. Coronary artery stents. IMPRESSION: Emphysematous and chronic bronchitic changes in the lungs.  No evidence of active pulmonary disease. Electronically Signed   By: Lucienne Capers M.D.   On: 12/17/2016 06:08   Dg Chest 2 View  Result Date: 12/16/2016 CLINICAL DATA:  LEFT chest pain worse for 1 day, history coronary artery disease post MI and coronary stenting, hypertension, COPD, smoking EXAM: CHEST  2 VIEW COMPARISON:  11/08/2016 FINDINGS: Normal heart size, mediastinal contours, and pulmonary vascularity. Coronary arterial stents noted. Chronic bronchitic changes without pulmonary infiltrate, pleural effusion or pneumothorax. No acute osseous findings. IMPRESSION: Mild chronic bronchitic changes without infiltrate. Electronically Signed   By: Lavonia Dana M.D.   On: 12/16/2016 12:34   Echo  11/09/16 Study Conclusions  - Left ventricle:  The cavity size was normal. Wall thickness was   increased in a pattern of mild LVH. Systolic function was normal.   The estimated ejection fraction was in the range of 55% to 60%.   Wall motion was normal; there were no regional wall motion   abnormalities. Doppler parameters are consistent with abnormal   left ventricular relaxation (grade 1 diastolic dysfunction). - Aortic valve: Poorly visualized. Mildly calcified leaflets. There   was no stenosis. Mean gradient (S): 8 mm Hg. - Aorta: Mildly dilated aortic root. Aortic root dimension: 38 mm   (ED). - Mitral valve: There was no significant regurgitation. - Right ventricle: The cavity size was normal. Systolic function   was normal. - Pulmonary arteries: No complete TR doppler jet so unable to   estimate PA systolic pressure. - Inferior vena cava: The vessel was normal in size. The   respirophasic diameter changes were in the normal range (>= 50%),   consistent with normal central venous pressure.  Impressions:  - Normal LV size with mild LV hypertrophy. EF 55-60%. Normal RV   size and systolic function. No significant valvular abnormality   (aortic valve poorly visualized but no doppler evidence for   significant stenosis or regurgitation). LAST CATH 2017 Conclusion   1. Triple vessel CAD 2. The LAD has mild plaque in the proximal segment and a patent stent in the distal/apical segment without restenosis.  3. The Circumflex has an ostial stent that extends into OM1. There is 40% ostial narrowing within the stent. The continuation of the AV groove Circumflex beyond OM1 is jailed by the stent with 50-70% stenosis. This continuation branch of the AV groove Circumflex is small in caliber.  4. The RCA has patent stents extending from the ostium of the vessel down into the PDA. There is diffuse mild stent restenosis.  5. The left main has a distal 30% stenosis.  6. Normal LV systolic function  Recommendations: Continue medical  management of CAD.     visual estimate. There are no wall motion abnormalities in the left ventricle.    Coronary Diagrams   Diagnostic Diagram        ROS: General:no colds or fevers, no weight changes Skin:no rashes or ulcers HEENT:no blurred vision, no congestion CV:see HPI PUL:see HPI GI:no diarrhea constipation or melena, no indigestion GU:no hematuria, no dysuria MS:no joint pain, no claudication Neuro:no syncope, no lightheadedness Endo:no diabetes, no thyroid disease   Blood pressure 117/83, pulse 89, temperature 98.4 F (36.9 C), temperature source Oral, resp. rate (!) 9, height 6' (1.829 m), weight 200 lb (90.7 kg), SpO2 97 %. PE: General:Pleasant affect, NAD, anxious Skin:Warm and dry, brisk capillary refill, Rt facial rash around mouth from shaving HEENT:normocephalic, sclera clear, mucus membranes moist Neck:supple, no JVD, no bruits  Heart:S1S2 RRR without  murmur, gallup, rub or click Lungs:clear without rales, rhonchi, or wheezes JTT:SVXB, non tender, + BS, do not palpate liver spleen or masses Ext:no lower ext edema, 2+ pedal pulses on Rt, Lt AKA, 2+ radial pulses Neuro:alert and oriented X 3, MAE, follows commands, + facial symmetry    Assessment/Plan 1. Unstable angina, neg troponins, but this has awakened from sleep at least twice, plan had been for cath Monday, last dose of Eliquis was yesterday AM, ? Cath later today.  Dr. Angelena Form to see and evaluate.  I will give IV Morphine prn for now.  Will add IV heparin depending.   2.  CAD with hx of s/p remote PCI with stenting in [place] in the past (LAD, OM1 and RCA stents). Last cardiac cath was done here [facility] in 11/2015 after multiple presentations to the ED for chest pain. LHC revealed nonobstructive CAD and medical therapy was recommended.   3.  Anxiety disorder and PTSD and ADD- he had Xanax from ER visit yesterday and he has been on for some time, but recently ran out.  Needs PCP for  management.  ? Psych consult  4.  + blood cultures with VIRIDANS STREPTOCOCCUS-- will recheck blood cultures today, that had been plan for Monday.  It was thought  Thought to be contaminate as was found in 1 bottle and is normal skin flora-- if + then reconsult Dr. Danford Bad.  5.  Lt AKA with prosthesis.    6.  HLD on crestor.   7. HTN, elevated yesterday but on higher dose of BB BP was in the 80-90s  Cecilie Kicks Nurse Practitioner Certified [place] Pager [phone] or after 5pm or weekends call [phone] 12/17/2016, 10:25 AM   I have personally seen and examined this patient with Cecilie Kicks, NP. I agree with the assessment and plan as outlined above.  He is known to have CAD. Last cath in January 2017 with stable disease. Now with recurrent chest pain, mostly atypical features. He had been on the schedule for cath next week as an outpatient. Troponin negative. My exam shows a WDWN male in NAD, CV:RRR, Pulm: clear bilaterally. Abd: soft, NT, ND. Ext: no edema Labs reviewed. EKG without ischemic changes Plan: admit on tele. Cycle troponin. Cath Monday.   Lauree Chandler 12/17/2016 11:47 AM

## 2016-12-17 NOTE — ED Provider Notes (Signed)
[place] [facility] DEPT Provider Note   CSN: [id number] Arrival date & time: 12/17/16  0522     History   Chief Complaint Chief Complaint  Patient presents with  . Chest Pain    HPI Aaron Dodson is a 51 y.o. male.  HPI  Aaron Dodson is a 51 year old male with history of coronary artery disease who presents today complaining of 3 days of chest pain. Patient states that the pain is in the left anterior chest and is sharp followed by heaviness radiating to his left scapula. He has had this pain since early January. Before that he had been pain-free for an extended period of time. His last cardiac intervention was in 2016 per patient report. He has had multiple MIs in the past. He is followed by Dr. Luther Parody for cardiology.     Past Medical History:  Diagnosis Date  . ADD (attention deficit disorder)   . Anxiety   . Chronic lower back pain   . Club foot   . COPD (chronic obstructive pulmonary disease) ([employer])   . Coronary artery disease    a. s/p multiple prior PCIs (LAD, LCx, OM1 - [place]; [place]) // b. LHC 1/17: LM 30, mLAD 20, dLAD stent patent, oLCx 40 within stent but o/w patent with 50-70 beyond stent (jailed), OM1 stent patent, oRCA stent patent with 20 ISR, EF 55-65 // c. Echo 1/17: EF 50-55, no RWMA, Gr 1 DD, mild AI  . Endocarditis    hx of IV drug abuse; tx At [facility] 2016  . GERD (gastroesophageal reflux disease)   . History of echocardiogram    Echo 11/09/16: mild LVH, EF 55-60, no RWMA, Gr 1 DD, mildly dilated aortic root (38 mm)  . Hyperlipidemia   . Hypertension   . Myocardial infarction 2000's - ~ 04/2015   "I've had a total of 3" (11/08/2016)  . PAF (paroxysmal atrial fibrillation) (HCC)    on Eliquis  . PTSD (post-traumatic stress disorder)   . Pulmonary embolism ([place])    "I've had 2 or 3" (11/08/2016)  . 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 Squamous cell carcinoma of scalp   . Stroke [facility]) ~ 2005-2006 X 2   denies residual on 11/08/2016     Patient Active Problem List   Diagnosis Date Noted  . Coronary artery disease involving native coronary artery of native heart   . CAD s/p PCI 11/19/2015  . Anxiety state   . Chest pain 11/18/2015  . HLD (hyperlipidemia) 11/18/2015  . Essential hypertension 11/18/2015  . Tobacco abuse 11/18/2015    Past Surgical History:  Procedure Laterality Date  . ABOVE KNEE LEG AMPUTATION Left   . CARDIAC CATHETERIZATION  "several"  . CARDIAC CATHETERIZATION N/A 11/26/2015   Procedure: Left Heart Cath and Coronary Angiography;  Surgeon: Burnell Blanks, MD;  Location: [facility];  Service: Cardiovascular;  Laterality: N/A;  . CLUB FOOT RELEASE Left Oct 20, 1967  . CORONARY ANGIOPLASTY  "several"  . CORONARY ANGIOPLASTY WITH STENT PLACEMENT  "several"   "total of 10 stents placed" (11/18/2015)  . LAPAROSCOPIC CHOLECYSTECTOMY    . ORTHOPEDIC SURGERY  1967-10-29-~ 2007   "total of 52 on my left leg; started w/club foot released"  . TONSILLECTOMY         Home Medications    Prior to Admission medications   Medication Sig Start Date End Date Taking? Authorizing Provider  ALPRAZolam Duanne Moron) 0.5 MG tablet Take 1 tablet (0.5 mg total) by  mouth at bedtime as needed for anxiety. 12/16/16  Yes Courteney Lyn Mackuen, MD  apixaban (ELIQUIS) 5 MG TABS tablet Take 5 mg by mouth 2 (two) times daily.   Yes Historical Provider, MD  clopidogrel (PLAVIX) 75 MG tablet Take 1 tablet (75 mg total) by mouth daily. 07/22/16  Yes Jennifer Chahn-Yang Choi, DO  HYDROcodone-acetaminophen (NORCO) 10-325 MG tablet Take 1 tablet by mouth 4 (four) times daily. scheduled   Yes Historical Provider, MD  metoprolol succinate (TOPROL-XL) 25 MG 24 hr tablet Take 1 tablet (25 mg total) by mouth daily. 10/13/16  Yes Jerline Pain, MD  rosuvastatin (CRESTOR) 5 MG tablet Take 5 mg by mouth at bedtime.    Yes Historical Provider, MD  senna (SENOKOT) 8.6 MG TABS tablet Take 2 tablets (17.2 mg total) by mouth at  bedtime. Patient not taking: Reported on 12/17/2016 11/09/16   Riccardo Dubin, MD    Family History Family History  Problem Relation Age of Onset  . Hypertension Father   . Lung cancer Father   . Heart attack Father     Social History Social History  Substance Use Topics  . Smoking status: Current Some Day Smoker    Packs/day: 1.00    Years: 35.00    Types: Cigarettes  . Smokeless tobacco: Never Used     Comment: quit day is 11/08/16  . Alcohol use 0.0 oz/week     Comment: 11/08/2016 nothing since "~ 2011"     Allergies   Benadryl [diphenhydramine hcl]; Promethazine hcl; Augmentin [amoxicillin-pot clavulanate]; and Toradol [ketorolac tromethamine]   Review of Systems Review of Systems  All other systems reviewed and are negative.    Physical Exam Updated Vital Signs BP 117/83   Pulse 89   Temp 98.4 F (36.9 C) (Oral)   Resp (!) 9   Ht 6' (1.829 m)   Wt 90.7 kg   SpO2 97%   BMI 27.12 kg/m   Physical Exam  Constitutional: He is oriented to person, place, and time. He appears well-developed and well-nourished.  HENT:  Head: Normocephalic and atraumatic.  Right Ear: External ear normal.  Left Ear: External ear normal.  Nose: Nose normal.  Mouth/Throat: Oropharynx is clear and moist.  Eyes: Conjunctivae and EOM are normal. Pupils are equal, round, and reactive to light.  Neck: Normal range of motion. Neck supple.  Cardiovascular: Normal heart sounds and intact distal pulses.  Tachycardia present.   Pulmonary/Chest: Effort normal and breath sounds normal.  Abdominal: Soft. Bowel sounds are normal.  Musculoskeletal: Normal range of motion.  Left aka  Neurological: He is alert and oriented to person, place, and time. He has normal reflexes.  Skin: Skin is warm and dry.  Psychiatric: He has a normal mood and affect. His behavior is normal. Judgment and thought content normal.  Nursing note and vitals reviewed.    ED Treatments / Results  Labs (all labs ordered  are listed, but only abnormal results are displayed) Labs Reviewed  COMPREHENSIVE METABOLIC PANEL - Abnormal; Notable for the following:       Result Value   Glucose, Bld 104 (*)    ALT 13 (*)    All other components within normal limits  TROPONIN I  CBC WITH DIFFERENTIAL/PLATELET  Randolm Idol, ED    EKG  EKG Interpretation  Date/Time:  Friday December 17 2016 05:28:18 EST Ventricular Rate:  110 PR Interval:  134 QRS Duration: 80 QT Interval:  332 QTC Calculation: 449 R Axis:  64 Text Interpretation:  Sinus tachycardia Otherwise normal ECG No significant change since last tracing Confirmed by Noha Milberger MD, Andee Poles [phone]) on 12/17/2016 7:05:47 AM       Radiology Dg Chest 2 View  Result Date: 12/17/2016 CLINICAL DATA:  Continuing chest pain. Saw cardiologist yesterday and was told to come to ED if pain continued. Smoker. EXAM: CHEST  2 VIEW COMPARISON:  12/16/2016 FINDINGS: Emphysematous changes in the lungs. Central peribronchial thickening and interstitial changes consistent with chronic bronchitis. No focal airspace disease or consolidation. No blunting of costophrenic angles. No pneumothorax. Normal heart size and pulmonary vascularity. Coronary artery stents. IMPRESSION: Emphysematous and chronic bronchitic changes in the lungs. No evidence of active pulmonary disease. Electronically Signed   By: Lucienne Capers M.D.   On: 12/17/2016 06:08   Dg Chest 2 View  Result Date: 12/16/2016 CLINICAL DATA:  LEFT chest pain worse for 1 day, history coronary artery disease post MI and coronary stenting, hypertension, COPD, smoking EXAM: CHEST  2 VIEW COMPARISON:  11/08/2016 FINDINGS: Normal heart size, mediastinal contours, and pulmonary vascularity. Coronary arterial stents noted. Chronic bronchitic changes without pulmonary infiltrate, pleural effusion or pneumothorax. No acute osseous findings. IMPRESSION: Mild chronic bronchitic changes without infiltrate. Electronically Signed   By: Lavonia Dana M.D.   On: 12/16/2016 12:34    Procedures Procedures (including critical care time)  Medications Ordered in ED Medications - No data to display   Initial Impression / Assessment and Plan / ED Course  I have reviewed the triage vital signs and the nursing notes.  Pertinent labs & imaging results that were available during my care of the patient were reviewed by me and considered in my medical decision making (see chart for details).    51 year old male known coronary artery disease who comes in today complaining of left-sided chest pain that has been present for 3 days. He is scheduled for catheterization on Monday. He has been having intermittent similar pain since early January. He had CT angiogram of his chest on 10/17/2016 and again on 1-18.  The CT angios of January 2 impression reports negative for pulmonary embolism. Centrilobular emphysematous disease. Today's workup reveals tachycardia on his EKG which is unchanged from yesterday. Otherwise labs are normal with troponin of 0. Discussed with Dr. Dub Mikes or on call for cardiology and they will see and assess 11:00 AM Patient awaiting cardiology evaluation.  Continues with same chest pain.  Updated patient.  Final Clinical Impressions(s) / ED Diagnoses   Final diagnoses:  None    New Prescriptions New Prescriptions   No medications on file     Pattricia Boss, MD 12/18/16 [phone]

## 2016-12-18 DIAGNOSIS — I200 Unstable angina: Secondary | ICD-10-CM

## 2016-12-18 LAB — BASIC METABOLIC PANEL
Anion gap: 10 (ref 5–15)
BUN: 13 mg/dL (ref 6–20)
CALCIUM: 9.6 mg/dL (ref 8.9–10.3)
CO2: 28 mmol/L (ref 22–32)
Chloride: 102 mmol/L (ref 101–111)
Creatinine, Ser: 1.08 mg/dL (ref 0.61–1.24)
GFR calc Af Amer: >60 mL/min (ref >60)
GLUCOSE: 93 mg/dL (ref 65–99)
Potassium: 3.6 mmol/L (ref 3.5–5.1)
SODIUM: 140 mmol/L (ref 135–145)

## 2016-12-18 LAB — HEPARIN LEVEL (UNFRACTIONATED)
HEPARIN UNFRACTIONATED: 0.58 [IU]/mL (ref 0.30–0.70)
Heparin Unfractionated: 0.59 IU/mL (ref 0.30–0.70)

## 2016-12-18 LAB — CBC
HCT: 39.3 % (ref 39.0–52.0)
Hemoglobin: 13.1 g/dL (ref 13.0–17.0)
MCH: 31.6 pg (ref 26.0–34.0)
MCHC: 33.3 g/dL (ref 30.0–36.0)
MCV: 94.7 fL (ref 78.0–100.0)
PLATELETS: 405 10*3/uL — AB (ref 150–400)
RBC: 4.15 MIL/uL — AB (ref 4.22–5.81)
RDW: 14 % (ref 11.5–15.5)
WBC: 7.8 10*3/uL (ref 4.0–10.5)

## 2016-12-18 LAB — PROTIME-INR
INR: 0.98
PROTHROMBIN TIME: 13 s (ref 11.4–15.2)

## 2016-12-18 LAB — TROPONIN I

## 2016-12-18 LAB — MRSA PCR SCREENING: MRSA by PCR: POSITIVE — AB

## 2016-12-18 LAB — APTT: aPTT: 86 seconds — ABNORMAL HIGH (ref 24–36)

## 2016-12-18 MED ORDER — NICOTINE 21 MG/24HR TD PT24
21.0000 mg | MEDICATED_PATCH | Freq: Every day | TRANSDERMAL | Status: DC
  Administered 2016-12-18 – 2016-12-20 (×3): 21 mg via TRANSDERMAL
  Filled 2016-12-18 (×4): qty 1

## 2016-12-18 MED ORDER — PANTOPRAZOLE SODIUM 20 MG PO TBEC
20.0000 mg | DELAYED_RELEASE_TABLET | Freq: Every day | ORAL | Status: DC
  Administered 2016-12-18 – 2016-12-20 (×3): 20 mg via ORAL
  Filled 2016-12-18 (×3): qty 1

## 2016-12-18 MED ORDER — ALUM & MAG HYDROXIDE-SIMETH 200-200-20 MG/5 ML NICU TOPICAL
1.0000 "application " | Freq: Four times a day (QID) | TOPICAL | Status: DC | PRN
  Filled 2016-12-18: qty 355

## 2016-12-18 MED ORDER — MUPIROCIN 2 % EX OINT
1.0000 "application " | TOPICAL_OINTMENT | Freq: Two times a day (BID) | CUTANEOUS | Status: DC
  Administered 2016-12-18 – 2016-12-19 (×4): 1 via NASAL
  Filled 2016-12-18 (×2): qty 22

## 2016-12-18 MED ORDER — ALUM & MAG HYDROXIDE-SIMETH 200-200-20 MG/5ML PO SUSP
30.0000 mL | ORAL | Status: DC | PRN
  Filled 2016-12-18: qty 30

## 2016-12-18 MED ORDER — CHLORHEXIDINE GLUCONATE CLOTH 2 % EX PADS
6.0000 | MEDICATED_PAD | Freq: Every day | CUTANEOUS | Status: DC
  Administered 2016-12-18 – 2016-12-20 (×3): 6 via TOPICAL

## 2016-12-18 NOTE — Progress Notes (Signed)
Progress Note  Patient Name: Aaron Dodson Date of Encounter: 12/18/2016  Primary Cardiologist: Dr. Candee Furbish  Subjective   No chest pain or breathlessness at rest.  Inpatient Medications    Scheduled Meds: . aspirin EC  81 mg Oral Daily  . Chlorhexidine Gluconate Cloth  6 each Topical Q0600  . clopidogrel  75 mg Oral Daily  . HYDROcodone-acetaminophen  1 tablet Oral QID  . metoprolol succinate  25 mg Oral Daily  . mupirocin ointment  1 application Nasal BID  . nicotine  21 mg Transdermal Daily  . pantoprazole  20 mg Oral Daily  . rosuvastatin  5 mg Oral QHS   Continuous Infusions: . sodium chloride    . heparin 1,350 Units/hr (12/17/16 2200)   PRN Meds: acetaminophen, ALPRAZolam, morphine injection, nitroGLYCERIN, ondansetron (ZOFRAN) IV, zolpidem   Vital Signs    Vitals:   12/17/16 1650 12/17/16 1700 12/17/16 2100 12/18/16 0500  BP: (!) 122/97 (!) 141/88 122/87 122/65  Pulse: 92  (!) 103 78  Resp: 15 14 16 16   Temp: 98.4 F (36.9 C)  97.9 F (36.6 C) 98 F (36.7 C)  TempSrc: Oral  Oral Oral  SpO2: 100% 100% 99% 96%  Weight: 173 lb 11.6 oz (78.8 kg)   173 lb 1.6 oz (78.5 kg)  Height: 6' (1.829 m)       Intake/Output Summary (Last 24 hours) at 12/18/16 1409 Last data filed at 12/18/16 1100  Gross per 24 hour  Intake            859.5 ml  Output                0 ml  Net            859.5 ml   Filed Weights   12/17/16 0550 12/17/16 1650 12/18/16 0500  Weight: 200 lb (90.7 kg) 173 lb 11.6 oz (78.8 kg) 173 lb 1.6 oz (78.5 kg)    Telemetry    I personally reviewed telemetry which shows sinus rhythm and sinus tachycardia.  ECG    I personally reviewed the tracing from 12/18/2016 which shows normal sinus rhythm.  Physical Exam   GEN: No acute distress.   Neck: No JVD Cardiac: RRR, no murmurs, rubs, or gallops.  Respiratory: Clear to auscultation bilaterally. GI: Soft, nontender, non-distended  MS: No edema; Left AKA.   Labs     Chemistry Recent Labs Lab 12/16/16 1355 12/17/16 0542 12/18/16 0036  NA 141 138 140  K 4.5 3.8 3.6  CL 105 103 102  CO2 28 22 28   GLUCOSE 82 104* 93  BUN 7 8 13   CREATININE 0.98 1.08 1.08  CALCIUM 9.8 9.8 9.6  PROT  --  7.1  --   ALBUMIN  --  3.8  --   AST  --  19  --   ALT  --  13*  --   ALKPHOS  --  98  --   BILITOT  --  0.5  --   GFRNONAA >60 >60 >60  GFRAA >60 >60 >60  ANIONGAP 8 13 10      Hematology Recent Labs Lab 12/16/16 1355 12/17/16 0542 12/18/16 0036  WBC 9.6 9.7 7.8  RBC 4.54 4.42 4.15*  HGB 14.5 14.2 13.1  HCT 42.7 41.8 39.3  MCV 94.1 94.6 94.7  MCH 31.9 32.1 31.6  MCHC 34.0 34.0 33.3  RDW 14.1 14.3 14.0  PLT 480* 452* 405*    Cardiac Enzymes Recent Labs Lab 12/17/16 [id number]  12/17/16 1324 12/17/16 1822 12/18/16 0036  TROPONINI <0.03 <0.03 <0.03 <0.03    Recent Labs Lab 12/16/16 1405 12/17/16 0556  TROPIPOC 0.00 0.00     Radiology    Dg Chest 2 View  Result Date: 12/17/2016 CLINICAL DATA:  Continuing chest pain. Saw cardiologist yesterday and was told to come to ED if pain continued. Smoker. EXAM: CHEST  2 VIEW COMPARISON:  12/16/2016 FINDINGS: Emphysematous changes in the lungs. Central peribronchial thickening and interstitial changes consistent with chronic bronchitis. No focal airspace disease or consolidation. No blunting of costophrenic angles. No pneumothorax. Normal heart size and pulmonary vascularity. Coronary artery stents. IMPRESSION: Emphysematous and chronic bronchitic changes in the lungs. No evidence of active pulmonary disease. Electronically Signed   By: Lucienne Capers M.D.   On: 12/17/2016 06:08    Cardiac Studies   Echocardiogram 11/09/2016: Study Conclusions  - Left ventricle: The cavity size was normal. Wall thickness was   increased in a pattern of mild LVH. Systolic function was normal.   The estimated ejection fraction was in the range of 55% to 60%.   Wall motion was normal; there were no regional wall  motion   abnormalities. Doppler parameters are consistent with abnormal   left ventricular relaxation (grade 1 diastolic dysfunction). - Aortic valve: Poorly visualized. Mildly calcified leaflets. There   was no stenosis. Mean gradient (S): 8 mm Hg. - Aorta: Mildly dilated aortic root. Aortic root dimension: 38 mm   (ED). - Mitral valve: There was no significant regurgitation. - Right ventricle: The cavity size was normal. Systolic function   was normal. - Pulmonary arteries: No complete TR doppler jet so unable to   estimate PA systolic pressure. - Inferior vena cava: The vessel was normal in size. The   respirophasic diameter changes were in the normal range (>= 50%),   consistent with normal central venous pressure.  Impressions:  - Normal LV size with mild LV hypertrophy. EF 55-60%. Normal RV   size and systolic function. No significant valvular abnormality   (aortic valve poorly visualized but no doppler evidence for   significant stenosis or regurgitation).  Patient Profile     51 y.o. male with a history of CAD status post prior stent interventions to the LAD, OM1, and RCA. Last cardiac catheterization at this facility in January 2007 revealed patent stent site with nonobstructive disease. He presents with recurring chest pain, suspected unstable angina with normal troponin I levels and has been scheduled for cardiac catheterization early next week by Dr. Angelena Form.  Assessment & Plan    1. Unstable angina, normal troponin I levels. ECG without acute changes.  2. CAD status post previous stent interventions to the LAD, OM1, and RCA, patent with mild atherosclerosis by angiography last year.  3. Hyperlipidemia, on Crestor.  4. Anxiety disorder with PTSD and ADD.  5. History of positive blood cultures for viridans streptococcus, felt to be contaminant. Repeat cultures are pending. He is afebrile and without leukocytosis.  Scheduled for diagnostic cardiac catheterization  per Dr. Angelena Form for Monday. Continue on IV heparin, aspirin, Plavix, Toprol-XL, and Crestor.  Signed, Rozann Lesches, MD  12/18/2016, 2:09 PM

## 2016-12-18 NOTE — Progress Notes (Signed)
[place] for heparin Indication: chest pain/ACS  Allergies  Allergen Reactions  . Benadryl [Diphenhydramine Hcl] Other (See Comments)    Aggressive behavior  . Promethazine Hcl Other (See Comments)    Unknown - given for surgical procedure and advised to never take again  . Augmentin [Amoxicillin-Pot Clavulanate] Diarrhea and Nausea Only  . Toradol [Ketorolac Tromethamine] Hives and Other (See Comments)    Redness. Patient states he tolerated ibuprofen fine.     Patient Measurements: Height: 6' (182.9 cm) Weight: 173 lb 11.6 oz (78.8 kg) (without prosthetic) IBW/kg (Calculated) : 77.6 Heparin Dosing Weight: 78 kg  Vital Signs: Temp: 97.9 F (36.6 C) (02/09 2100) Temp Source: Oral (02/09 2100) BP: 122/87 (02/09 2100) Pulse Rate: 103 (02/09 2100)  Labs:  Recent Labs  12/16/16 1355 12/16/16 1638 12/17/16 0542  12/17/16 1324 12/17/16 1822 12/17/16 2043 12/18/16 0036  HGB 14.5  --  14.2  --   --   --   --  13.1  HCT 42.7  --  41.8  --   --   --   --  39.3  PLT 480*  --  452*  --   --   --   --  405*  APTT  --   --   --   --  33  --  60* 86*  LABPROT  --  10.9  --   --   --   --   --  13.0  INR  --  1.0  --   --   --   --   --  0.98  HEPARINUNFRC  --   --   --   --  <0.10*  --   --  0.58  CREATININE 0.98  --  1.08  --   --   --   --  1.08  TROPONINI  --   --  <0.03  < > <0.03 <0.03  --  <0.03  < > = values in this interval not displayed.  Estimated Creatinine Clearance: 89.8 mL/min (by C-G formula based on SCr of 1.08 mg/dL).  Assessment: 51 yo m on apixaban pta for afib - last dose 2/8. Now on heparin gtt for ACS. Heparin level undetectable at baseline so apixaban no longer affecting. Heparin level therapeutic (0.58) on gtt at 1350 units/hr. No bleeding noted.  Goal of Therapy:  Heparin level 0.3-0.7 units/ml Monitor platelets by anticoagulation protocol: Yes   Plan:  -Continue heparin at 1350 units/hr -Will f/u 6 hr  confirmatory heparin level  Sherlon Handing, PharmD, BCPS Clinical pharmacist, pager [phone] 12/18/2016 1:35 AM

## 2016-12-18 NOTE — Progress Notes (Signed)
[place] for heparin Indication: chest pain/ACS  Allergies  Allergen Reactions  . Benadryl [Diphenhydramine Hcl] Other (See Comments)    Aggressive behavior  . Promethazine Hcl Other (See Comments)    Unknown - given for surgical procedure and advised to never take again  . Augmentin [Amoxicillin-Pot Clavulanate] Diarrhea and Nausea Only  . Toradol [Ketorolac Tromethamine] Hives and Other (See Comments)    Redness. Patient states he tolerated ibuprofen fine.     Patient Measurements: Height: 6' (182.9 cm) Weight: 173 lb 1.6 oz (78.5 kg) IBW/kg (Calculated) : 77.6 Heparin Dosing Weight: 78 kg  Vital Signs: Temp: 98 F (36.7 C) (02/10 0500) Temp Source: Oral (02/10 0500) BP: 122/65 (02/10 0500) Pulse Rate: 78 (02/10 0500)  Labs:  Recent Labs  12/16/16 1355 12/16/16 1638 12/17/16 0542  12/17/16 1324 12/17/16 1822 12/17/16 2043 12/18/16 0036 12/18/16 0919  HGB 14.5  --  14.2  --   --   --   --  13.1  --   HCT 42.7  --  41.8  --   --   --   --  39.3  --   PLT 480*  --  452*  --   --   --   --  405*  --   APTT  --   --   --   --  33  --  60* 86*  --   LABPROT  --  10.9  --   --   --   --   --  13.0  --   INR  --  1.0  --   --   --   --   --  0.98  --   HEPARINUNFRC  --   --   --   --  <0.10*  --   --  0.58 0.59  CREATININE 0.98  --  1.08  --   --   --   --  1.08  --   TROPONINI  --   --  <0.03  < > <0.03 <0.03  --  <0.03  --   < > = values in this interval not displayed.  Estimated Creatinine Clearance: 89.8 mL/min (by C-G formula based on SCr of 1.08 mg/dL).  Assessment: 22 yom on apixaban pta for afib - last dose 2/8. Now on heparin gtt for ACS. Heparin level undetectable at baseline so apixaban no longer affecting.   Confirmatory heparin level is therapeutic at 0.59 units/mL. Hemoglobin is within normal limits and platelet count elevated. No bleeding noted.   Goal of Therapy:  Heparin level 0.3-0.7 units/ml Monitor  platelets by anticoagulation protocol: Yes   Plan:  -Continue heparin at 1350 units/hr -Daily heparin level and CBC -F/U plans for transition back to apixaban post-cath (planned for Monday)  Demetrius Charity, PharmD Acute Care Pharmacy Resident  Pager: [phone] 12/18/2016

## 2016-12-19 ENCOUNTER — Other Ambulatory Visit: Payer: Self-pay

## 2016-12-19 LAB — CBC
HEMATOCRIT: 37.1 % — AB (ref 39.0–52.0)
HEMOGLOBIN: 12.5 g/dL — AB (ref 13.0–17.0)
MCH: 31.9 pg (ref 26.0–34.0)
MCHC: 33.7 g/dL (ref 30.0–36.0)
MCV: 94.6 fL (ref 78.0–100.0)
Platelets: 327 10*3/uL (ref 150–400)
RBC: 3.92 MIL/uL — AB (ref 4.22–5.81)
RDW: 14 % (ref 11.5–15.5)
WBC: 7.9 10*3/uL (ref 4.0–10.5)

## 2016-12-19 LAB — BASIC METABOLIC PANEL
ANION GAP: 9 (ref 5–15)
BUN: 10 mg/dL (ref 6–20)
CHLORIDE: 102 mmol/L (ref 101–111)
CO2: 25 mmol/L (ref 22–32)
Calcium: 9 mg/dL (ref 8.9–10.3)
Creatinine, Ser: 1.06 mg/dL (ref 0.61–1.24)
GFR calc Af Amer: >60 mL/min (ref >60)
Glucose, Bld: 95 mg/dL (ref 65–99)
POTASSIUM: 3.6 mmol/L (ref 3.5–5.1)
SODIUM: 136 mmol/L (ref 135–145)

## 2016-12-19 LAB — TROPONIN I: Troponin I: <0.03 ng/mL (ref <0.03)

## 2016-12-19 LAB — HEPARIN LEVEL (UNFRACTIONATED): HEPARIN UNFRACTIONATED: 0.45 [IU]/mL (ref 0.30–0.70)

## 2016-12-19 MED ORDER — GI COCKTAIL ~~LOC~~
30.0000 mL | Freq: Once | ORAL | Status: AC
Start: 2016-12-19 — End: 2016-12-19
  Administered 2016-12-19: 30 mL via ORAL
  Filled 2016-12-19: qty 30

## 2016-12-19 NOTE — Progress Notes (Signed)
Progress Note  Patient Name: Aaron Dodson Date of Encounter: 12/19/2016  Primary Cardiologist: Dr. Candee Furbish  Subjective   No chest pain now. Laying in bed. Going to cath Monday.   Inpatient Medications    Scheduled Meds: . aspirin EC  81 mg Oral Daily  . Chlorhexidine Gluconate Cloth  6 each Topical Q0600  . clopidogrel  75 mg Oral Daily  . HYDROcodone-acetaminophen  1 tablet Oral QID  . metoprolol succinate  25 mg Oral Daily  . mupirocin ointment  1 application Nasal BID  . nicotine  21 mg Transdermal Daily  . pantoprazole  20 mg Oral Daily  . rosuvastatin  5 mg Oral QHS   Continuous Infusions: . sodium chloride    . heparin 1,350 Units/hr (12/19/16 0055)   PRN Meds: acetaminophen, ALPRAZolam, alum & mag hydroxide-simeth, morphine injection, nitroGLYCERIN, ondansetron (ZOFRAN) IV, zolpidem   Vital Signs    Vitals:   12/18/16 0500 12/18/16 1500 12/18/16 2209 12/19/16 0617  BP: 122/65 124/84 111/69 107/62  Pulse: 78 91 96 93  Resp: 16 14    Temp: 98 F (36.7 C) 98.7 F (37.1 C) 97.8 F (36.6 C) 98.4 F (36.9 C)  TempSrc: Oral Oral Oral Oral  SpO2: 96% 98% 100% 97%  Weight: 173 lb 1.6 oz (78.5 kg)   180 lb 8 oz (81.9 kg)  Height:       No intake or output data in the 24 hours ending 12/19/16 1136 Filed Weights   12/17/16 1650 12/18/16 0500 12/19/16 0617  Weight: 173 lb 11.6 oz (78.8 kg) 173 lb 1.6 oz (78.5 kg) 180 lb 8 oz (81.9 kg)    Telemetry    I personally reviewed telemetry which shows sinus rhythm and sinus tachycardia.  ECG    I personally reviewed the tracing from 12/18/2016 which shows normal sinus rhythm.  Physical Exam   GEN: No acute distress.   Neck: No JVD Cardiac: RRR, no murmurs, rubs, or gallops.  Respiratory: Clear to auscultation bilaterally. GI: Soft, nontender, non-distended  MS: No edema; Left AKA.   Labs    Chemistry  Recent Labs Lab 12/17/16 0542 12/18/16 0036 12/19/16 0409  NA 138 140 136  K 3.8 3.6 3.6  CL  103 102 102  CO2 22 28 25   GLUCOSE 104* 93 95  BUN 8 13 10   CREATININE 1.08 1.08 1.06  CALCIUM 9.8 9.6 9.0  PROT 7.1  --   --   ALBUMIN 3.8  --   --   AST 19  --   --   ALT 13*  --   --   ALKPHOS 98  --   --   BILITOT 0.5  --   --   GFRNONAA >60 >60 >60  GFRAA >60 >60 >60  ANIONGAP 13 10 9      Hematology  Recent Labs Lab 12/17/16 0542 12/18/16 0036 12/19/16 0409  WBC 9.7 7.8 7.9  RBC 4.42 4.15* 3.92*  HGB 14.2 13.1 12.5*  HCT 41.8 39.3 37.1*  MCV 94.6 94.7 94.6  MCH 32.1 31.6 31.9  MCHC 34.0 33.3 33.7  RDW 14.3 14.0 14.0  PLT 452* 405* 327    Cardiac Enzymes  Recent Labs Lab 12/17/16 0842 12/17/16 1324 12/17/16 1822 12/18/16 0036  TROPONINI <0.03 <0.03 <0.03 <0.03     Recent Labs Lab 12/16/16 1405 12/17/16 0556  TROPIPOC 0.00 0.00     Radiology    No results found.  Cardiac Studies   Echocardiogram 11/09/2016: Study Conclusions  -  Left ventricle: The cavity size was normal. Wall thickness was   increased in a pattern of mild LVH. Systolic function was normal.   The estimated ejection fraction was in the range of 55% to 60%.   Wall motion was normal; there were no regional wall motion   abnormalities. Doppler parameters are consistent with abnormal   left ventricular relaxation (grade 1 diastolic dysfunction). - Aortic valve: Poorly visualized. Mildly calcified leaflets. There   was no stenosis. Mean gradient (S): 8 mm Hg. - Aorta: Mildly dilated aortic root. Aortic root dimension: 38 mm   (ED). - Mitral valve: There was no significant regurgitation. - Right ventricle: The cavity size was normal. Systolic function   was normal. - Pulmonary arteries: No complete TR doppler jet so unable to   estimate PA systolic pressure. - Inferior vena cava: The vessel was normal in size. The   respirophasic diameter changes were in the normal range (>= 50%),   consistent with normal central venous pressure.  Impressions:  - Normal LV size with mild  LV hypertrophy. EF 55-60%. Normal RV   size and systolic function. No significant valvular abnormality   (aortic valve poorly visualized but no doppler evidence for   significant stenosis or regurgitation).  Patient Profile     51 y.o. male with a history of CAD status post prior stent interventions to the LAD, OM1, and RCA. Last cardiac catheterization at this facility in January 2007 revealed patent stent site with nonobstructive disease. He presents with recurring chest pain, suspected unstable angina with normal troponin I levels and has been scheduled for cardiac catheterization early next week by Dr. Angelena Form.  Assessment & Plan    1. Unstable angina, normal troponin I levels. ECG without acute changes. Recurrent CP.   2. CAD status post previous stent interventions to the LAD, OM1, and RCA, patent with mild atherosclerosis by angiography last year.  3. Hyperlipidemia, on Crestor.  4. Anxiety disorder with PTSD and ADD.  5. History of positive blood cultures for viridans streptococcus, felt to be contaminant. Repeat cultures are NGTD. He is afebrile and without leukocytosis.  Scheduled for diagnostic cardiac catheterization per Dr. Angelena Form for Monday. Continue on IV heparin, aspirin, Plavix, Toprol-XL, and Crestor.  Signed, Candee Furbish, MD  12/19/2016, 11:36 AM

## 2016-12-19 NOTE — Progress Notes (Signed)
[place] for heparin Indication: chest pain/ACS  Allergies  Allergen Reactions  . Benadryl [Diphenhydramine Hcl] Other (See Comments)    Aggressive behavior  . Promethazine Hcl Other (See Comments)    Unknown - given for surgical procedure and advised to never take again  . Augmentin [Amoxicillin-Pot Clavulanate] Diarrhea and Nausea Only  . Toradol [Ketorolac Tromethamine] Hives and Other (See Comments)    Redness. Patient states he tolerated ibuprofen fine.     Patient Measurements: Height: 6' (182.9 cm) Weight: 180 lb 8 oz (81.9 kg) IBW/kg (Calculated) : 77.6 Heparin Dosing Weight: 78 kg  Vital Signs: Temp: 98.4 F (36.9 C) (02/11 0617) Temp Source: Oral (02/11 0617) BP: 107/62 (02/11 0617) Pulse Rate: 93 (02/11 0617)  Labs:  Recent Labs  12/16/16 1638 12/17/16 0542  12/17/16 1324 12/17/16 1822 12/17/16 2043 12/18/16 0036 12/18/16 0919 12/19/16 0409  HGB  --  14.2  --   --   --   --  13.1  --  12.5*  HCT  --  41.8  --   --   --   --  39.3  --  37.1*  PLT  --  452*  --   --   --   --  405*  --  327  APTT  --   --   --  33  --  60* 86*  --   --   LABPROT 10.9  --   --   --   --   --  13.0  --   --   INR 1.0  --   --   --   --   --  0.98  --   --   HEPARINUNFRC  --   --   < > <0.10*  --   --  0.58 0.59 0.45  CREATININE  --  1.08  --   --   --   --  1.08  --  1.06  TROPONINI  --  <0.03  < > <0.03 <0.03  --  <0.03  --   --   < > = values in this interval not displayed.  Estimated Creatinine Clearance: 91.5 mL/min (by C-G formula based on SCr of 1.06 mg/dL).  Assessment: 33 yom on apixaban pta for afib - last dose 2/8. Now on heparin gtt for ACS. Heparin level undetectable at baseline so apixaban no longer affecting.   Daily heparin level is therapeutic at 0.45 units/mL. Hemoglobin and platelet count both down a little since yesterday. No bleeding noted. Will monitor.  Goal of Therapy:  Heparin level 0.3-0.7  units/ml Monitor platelets by anticoagulation protocol: Yes   Plan:  -Continue heparin at 1350 units/hr -Daily heparin level and CBC -F/U plans for transition back to apixaban post-cath (planned for Monday)  Demetrius Charity, PharmD Acute Care Pharmacy Resident  Pager: [phone] 12/19/2016

## 2016-12-19 NOTE — Progress Notes (Signed)
Pt c/o chest pain, nitro given per prn orders.  Edward Qualia RN

## 2016-12-20 ENCOUNTER — Ambulatory Visit (HOSPITAL_COMMUNITY): Admission: RE | Admit: 2016-12-20 | Payer: Medicare HMO | Source: Ambulatory Visit | Admitting: Cardiovascular Disease

## 2016-12-20 ENCOUNTER — Encounter (HOSPITAL_COMMUNITY): Payer: Self-pay | Admitting: Cardiovascular Disease

## 2016-12-20 ENCOUNTER — Encounter (HOSPITAL_COMMUNITY)
Admission: EM | Disposition: A | Discharge status: Home / Self Care | Payer: Self-pay | Source: Home / Self Care | Attending: Cardiovascular Disease

## 2016-12-20 DIAGNOSIS — I480 Paroxysmal atrial fibrillation: Secondary | ICD-10-CM

## 2016-12-20 DIAGNOSIS — Z720 Tobacco use: Secondary | ICD-10-CM

## 2016-12-20 DIAGNOSIS — Z9111 Patient's noncompliance with dietary regimen: Secondary | ICD-10-CM

## 2016-12-20 DIAGNOSIS — E782 Mixed hyperlipidemia: Secondary | ICD-10-CM

## 2016-12-20 DIAGNOSIS — I2510 Atherosclerotic heart disease of native coronary artery without angina pectoris: Secondary | ICD-10-CM

## 2016-12-20 HISTORY — PX: LEFT HEART CATH AND CORONARY ANGIOGRAPHY: CATH118249

## 2016-12-20 HISTORY — DX: Paroxysmal atrial fibrillation: I48.0

## 2016-12-20 SURGERY — LEFT HEART CATH AND CORONARY ANGIOGRAPHY

## 2016-12-20 MED ORDER — SODIUM CHLORIDE 0.9 % IV SOLN
250.0000 mL | INTRAVENOUS | Status: DC | PRN
Start: 2016-12-20 — End: 2016-12-20

## 2016-12-20 MED ORDER — PANTOPRAZOLE SODIUM 20 MG PO TBEC: 20.0000 mg | DELAYED_RELEASE_TABLET | Freq: Every day | ORAL | 6 refills | Status: DC

## 2016-12-20 MED ORDER — ACETAMINOPHEN 325 MG PO TABS: 650.0000 mg | ORAL_TABLET | ORAL | Status: DC | PRN

## 2016-12-20 MED ORDER — SODIUM CHLORIDE 0.9 % IV SOLN: 250.0000 mL | INTRAVENOUS | Status: DC | PRN

## 2016-12-20 MED ORDER — SODIUM CHLORIDE 0.9 % IV SOLN
INTRAVENOUS | Status: DC
  Administered 2016-12-20: 13:00:00 via INTRAVENOUS

## 2016-12-20 MED ORDER — FENTANYL CITRATE (PF) 100 MCG/2ML IJ SOLN
INTRAMUSCULAR | Status: DC | PRN
  Administered 2016-12-20: 25 ug via INTRAVENOUS
  Administered 2016-12-20: 50 ug via INTRAVENOUS

## 2016-12-20 MED ORDER — SODIUM CHLORIDE 0.9 % WEIGHT BASED INFUSION
3.0000 mL/kg/h | INTRAVENOUS | Status: AC
  Administered 2016-12-20: 3 mL/kg/h via INTRAVENOUS

## 2016-12-20 MED ORDER — HEPARIN (PORCINE) IN NACL 2-0.9 UNIT/ML-% IJ SOLN
INTRAMUSCULAR | Status: DC | PRN
  Administered 2016-12-20: 500 mL via INTRA_ARTERIAL
  Administered 2016-12-20: 1000 mL

## 2016-12-20 MED ORDER — SODIUM CHLORIDE 0.9 % WEIGHT BASED INFUSION
1.0000 mL/kg/h | INTRAVENOUS | Status: DC
  Administered 2016-12-20: 1 mL/kg/h via INTRAVENOUS

## 2016-12-20 MED ORDER — ACETAMINOPHEN 325 MG PO TABS: 650.0000 mg | ORAL_TABLET | ORAL | Status: AC | PRN

## 2016-12-20 MED ORDER — LIDOCAINE HCL (PF) 1 % IJ SOLN
INTRAMUSCULAR | Status: DC | PRN
  Administered 2016-12-20 (×2): 2 mL via INTRADERMAL
  Administered 2016-12-20: 20 mL via INTRADERMAL

## 2016-12-20 MED ORDER — SODIUM CHLORIDE 0.9% FLUSH
3.0000 mL | Freq: Two times a day (BID) | INTRAVENOUS | Status: DC
  Administered 2016-12-20: 3 mL via INTRAVENOUS

## 2016-12-20 MED ORDER — ASPIRIN EC 81 MG PO TBEC
81.0000 mg | DELAYED_RELEASE_TABLET | Freq: Every day | ORAL | Status: DC
  Filled 2016-12-20: qty 1

## 2016-12-20 MED ORDER — SODIUM CHLORIDE 0.9% FLUSH: 3.0000 mL | INTRAVENOUS | Status: DC | PRN

## 2016-12-20 MED ORDER — APIXABAN 5 MG PO TABS: 5.0000 mg | ORAL_TABLET | Freq: Two times a day (BID) | ORAL | 6 refills | Status: DC

## 2016-12-20 MED ORDER — ASPIRIN 81 MG PO CHEW
81.0000 mg | CHEWABLE_TABLET | ORAL | Status: AC
  Administered 2016-12-20: 81 mg via ORAL
  Filled 2016-12-20: qty 1

## 2016-12-20 MED ORDER — SODIUM CHLORIDE 0.9% FLUSH: 3.0000 mL | Freq: Two times a day (BID) | INTRAVENOUS | Status: DC

## 2016-12-20 MED ORDER — IOPAMIDOL (ISOVUE-370) INJECTION 76%
INTRAVENOUS | Status: DC | PRN
Start: 2016-12-20 — End: 2016-12-20
  Administered 2016-12-20: 65 mL via INTRA_ARTERIAL

## 2016-12-20 MED ORDER — NICOTINE 21 MG/24HR TD PT24: 21.0000 mg | MEDICATED_PATCH | Freq: Every day | TRANSDERMAL | 0 refills | Status: DC

## 2016-12-20 MED ORDER — MIDAZOLAM HCL 2 MG/2ML IJ SOLN
INTRAMUSCULAR | Status: DC | PRN
  Administered 2016-12-20: 1 mg via INTRAVENOUS
  Administered 2016-12-20: 2 mg via INTRAVENOUS
  Administered 2016-12-20: 1 mg via INTRAVENOUS

## 2016-12-20 MED ORDER — ONDANSETRON HCL 4 MG/2ML IJ SOLN: 4.0000 mg | Freq: Four times a day (QID) | INTRAMUSCULAR | Status: DC | PRN

## 2016-12-20 MED ORDER — NITROGLYCERIN 0.4 MG SL SUBL: 0.4000 mg | SUBLINGUAL_TABLET | SUBLINGUAL | 6 refills | Status: AC | PRN

## 2016-12-20 SURGICAL SUPPLY — 9 items
CATH INFINITI 5FR MULTPACK ANG (CATHETERS) ×3 IMPLANT
GLIDESHEATH SLEND SS 6F .021 (SHEATH) IMPLANT
KIT HEART LEFT (KITS) ×3 IMPLANT
PACK CARDIAC CATHETERIZATION (CUSTOM PROCEDURE TRAY) ×3 IMPLANT
SHEATH PINNACLE 5F 10CM (SHEATH) ×3 IMPLANT
SYR MEDRAD MARK V 150ML (SYRINGE) ×3 IMPLANT
TRANSDUCER W/STOPCOCK (MISCELLANEOUS) ×3 IMPLANT
TUBING CIL FLEX 10 FLL-RA (TUBING) ×3 IMPLANT
WIRE EMERALD 3MM-J .035X150CM (WIRE) ×3 IMPLANT

## 2016-12-20 NOTE — Discharge Instructions (Signed)
Call [facility] at [phone] if any bleeding, swelling or drainage at cath site.  May shower, no tub baths for 48 hours for groin sticks. No lifting over 5 pounds for 3 days.  No Driving for 3 days  Heart Healthy diet.  You need to have a primary MD to treat anxiety.  Take 1 NTG, under your tongue, while sitting.  If no relief of pain may repeat NTG, one tab every 5 minutes up to 3 tablets total over 15 minutes.  If no relief CALL 911.  If you have dizziness/lightheadness  while taking NTG, stop taking and call 911.        Decrease caffeine, only 1 cup of caffeine daily  Stop smoking very improtant  Resume Eliquis in the morning.   Call [facility] in AM for appt.  If unable to get one, contact your insurance for recommendation within your network.

## 2016-12-20 NOTE — Discharge Summary (Signed)
Physician Discharge Summary       Patient ID: Aaron Dodson MRN: [id number] DOB/AGE: Jun 02, 1966 51 y.o.  Admit date: 12/17/2016 Discharge date: 12/20/2016 Primary Cardiologist:Dr. Marlou Porch   Discharge Diagnoses:  Principal Problem:   Chest pain, cath with non obstructive CAD 12/20/16 Active Problems:   HLD (hyperlipidemia)   Essential hypertension   Tobacco abuse   Anxiety state   CAD in native artery   PAF (paroxysmal atrial fibrillation) ([place]) on Eliquis   Discharged Condition: good  Procedures: cardiac cath 12/20/16 by Dr. Claiborne Billings  Procedures   Left Heart Cath and Coronary Angiography  Conclusion     Ost RCA to 4th RCA lesion, 10 %stenosed.  Mid RCA lesion, 30 %stenosed.  Dist LAD lesion, 15 %stenosed.  Ost LAD lesion, 20 %stenosed.  Mid LAD lesion, 20 %stenosed.  Ost 1st Mrg to 1st Mrg lesion, 0 %stenosed.  Ost Cx to Prox Cx lesion, 0 %stenosed.  The left ventricular systolic function is normal.  LV end diastolic pressure is normal.  The left ventricular ejection fraction is 55-65% by visual estimate.   Normal LV function with an ejection fraction of 55-60% without focal segmental wall motion abnormalities.  Mild nonobstructive residual CAD with coronary calcification of the proximal LAD with 20% proximal stenosis, 20% mid stenosis, and a patent distal LAD stent with 10-15% intimal hyperplasia; widely patent stent extending just beyond the ostium of the circumflex vessel into the OM1 vessel with jailing of a small branch of this marginal vessel; and extensive stenting to a dominant RCA with mild 30% focal mid stent narrowing.  RECOMMENDATION: Mediical therapy.     Hospital Course: 75 yom with hx of CAD s/p remote PCI with stenting in [place] in the past (LAD, OM1 and RCA stents). Last cardiac cath was done here [facility] in 11/2015 after multiple presentations to the ED for chest pain. LHC revealed nonobstructive CAD and medical therapy was  recommended. His other hx includes HTN, HL, prior stroke, prior IV drug abuse and endocarditis in 2016, chronic low back pain (takes opioids), anxiety and reported atrial fibrillation (although this has not been documented). 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   Pt had recently been hospitalized for chest pain and per note refused nuc study, he was then seen in ER and then office with plans for cath for continued episodes of chest pain that awakened from sleep.  Additionally he had been on Xanax but he does not have PCP to order.  He presented back to ER with chest pain that awakened from sleep. His Eliquis was held IV heparin and pt admitted until Eliquis out of system.  Troponins have been negative.    He underwent cardiac cath today with findings of non obstructive CAD.  He has done well post procedure.  No a fib on this admit.  He has been seen by Dr. Irish Lack.   He has walked with prosthesis and did well.  He will keep follow up appt with Dr. Marlou Porch.  He will resume eliquis 12/21/16.  Care manager consult was obtained for PCP.  He will call for [facility] and if not them he will call his insurance company for recommendation.   Consults: None- MD + care manager  Significant Diagnostic Studies:  BMP Latest Ref Rng & Units 12/19/2016 12/18/2016 12/17/2016  Glucose 65 - 99 mg/dL 95 93 104(H)  BUN 6 - 20 mg/dL _0 Creatinine  0.61 - 1.24 mg/dL 1.06 1.08 1.08  Sodium 135 - 145 mmol/L 136 140 138  Potassium 3.5 - 5.1 mmol/L 3.6 3.6 3.8  Chloride 101 - 111 mmol/L 102 102 103  CO2 22 - 32 mmol/L _0 Calcium 8.9 - 10.3 mg/dL 9.0 9.6 9.8   Troponin negative X 3  CBC Latest Ref Rng & Units 12/19/2016 12/18/2016 12/17/2016  WBC 4.0 - 10.5 K/uL 7.9 7.8 9.7  Hemoglobin 13.0 - 17.0 g/dL 12.5(L) 13.1 14.2  Hematocrit 39.0 - 52.0 % 37.1(L) 39.3 41.8  Platelets 150 - 400 K/uL 327 405(H) 452(H)   Hepatic Function Latest Ref Rng  & Units 12/17/2016 11/09/2016 06/13/2016  Total Protein 6.5 - 8.1 g/dL 7.1 6.5 7.4  Albumin 3.5 - 5.0 g/dL 3.8 3.6 3.7  AST 15 - 41 U/L _1 ALT 17 - 63 U/L 13(L) 12(L) 18  Alk Phosphatase 38 - 126 U/L 98 101 128(H)  Total Bilirubin 0.3 - 1.2 mg/dL 0.5 0.9 0.6    BLood cultures pending, no growth for 3 days.  Done for previous + culture.    CHEST  2 VIEW COMPARISON:  12/16/2016 FINDINGS: Emphysematous changes in the lungs. Central peribronchial thickening and interstitial changes consistent with chronic bronchitis. No focal airspace disease or consolidation. No blunting of costophrenic angles. No pneumothorax. Normal heart size and pulmonary vascularity. Coronary artery stents. IMPRESSION: Emphysematous and chronic bronchitic changes in the lungs. No evidence of active pulmonary disease.   Discharge Exam: Blood pressure 111/75, pulse 87, temperature 98.8 F (37.1 C), temperature source Oral, resp. rate 19, height 6' (1.829 m), weight 179 lb 10.8 oz (81.5 kg), SpO2 98 %.  Disposition: 01-Home or Self Care   Allergies as of 12/20/2016      Reactions   Benadryl [diphenhydramine Hcl] Other (See Comments)   Aggressive behavior   Promethazine Hcl Other (See Comments)   Unknown - given for surgical procedure and advised to never take again   Augmentin [amoxicillin-pot Clavulanate] Diarrhea, Nausea Only   Toradol [ketorolac Tromethamine] Hives, Other (See Comments)   Redness. Patient states he tolerated ibuprofen fine.       Medication List    STOP taking these medications   senna 8.6 MG Tabs tablet Commonly known as:  SENOKOT     TAKE these medications   acetaminophen 325 MG tablet Commonly known as:  TYLENOL Take 2 tablets (650 mg total) by mouth every 4 (four) hours as needed for headache or mild pain.   ALPRAZolam 0.5 MG tablet Commonly known as:  XANAX Take 1 tablet (0.5 mg total) by mouth at bedtime as needed for anxiety.   apixaban 5 MG Tabs  tablet Commonly known as:  ELIQUIS Take 1 tablet (5 mg total) by mouth 2 (two) times daily. Start taking on:  12/21/2016   clopidogrel 75 MG tablet Commonly known as:  PLAVIX Take 1 tablet (75 mg total) by mouth daily.   HYDROcodone-acetaminophen 10-325 MG tablet Commonly known as:  NORCO Take 1 tablet by mouth 4 (four) times daily. scheduled   metoprolol succinate 25 MG 24 hr tablet Commonly known as:  TOPROL-XL Take 1 tablet (25 mg total) by mouth daily.   nicotine 21 mg/24hr patch Commonly known as:  NICODERM CQ - dosed in mg/24 hours Place 1 patch (21 mg total) onto the skin daily. Start taking on:  12/21/2016   nitroGLYCERIN 0.4 MG SL tablet Commonly known as:  NITROSTAT Place 1 tablet (0.4 mg total) under the  tongue every 5 (five) minutes x 3 doses as needed for chest pain.   pantoprazole 20 MG tablet Commonly known as:  PROTONIX Take 1 tablet (20 mg total) by mouth daily. Start taking on:  12/21/2016   rosuvastatin 5 MG tablet Commonly known as:  CRESTOR Take 5 mg by mouth at bedtime.      Follow-up Information    Candee Furbish, MD Follow up on 01/05/2017.   Specialty:  Cardiology Why:  at 1:45 pm Contact information: [address] [place] [address] [phone]            Discharge Instructions: Call [facility] at [phone] if any bleeding, swelling or drainage at cath site.  May shower, no tub baths for 48 hours for groin sticks. No lifting over 5 pounds for 3 days.  No Driving for 3 days  Heart Healthy diet.  You need to have a primary MD to treat anxiety.  Take 1 NTG, under your tongue, while sitting.  If no relief of pain may repeat NTG, one tab every 5 minutes up to 3 tablets total over 15 minutes.  If no relief CALL 911.  If you have dizziness/lightheadness  while taking NTG, stop taking and call 911.        Decrease caffeine, only 1 cup of caffeine daily  Stop smoking very improtant  Call [facility]. In AM for appointment - if not able to get then call [facility] customer service to determine physicians that are in network, will then choose one and call for appt.                 Resume Eliquis in the morning.    Signed: Cecilie Kicks Nurse Practitioner-Certified [facility]: [facility] 12/20/2016, 1:42 PM  Time spent on discharge : > 30 minutes.    I have examined the patient and reviewed assessment and plan and discussed with patient.  Agree with above as stated.  Right groin stable post cath.  Plan discharge later today.  Needs PMD for medical therapy for anxiety.  Stressed importance of dietary changes.  He drinks a lot of soda, and had [employer] brought to his room immediately after his cath, because he was too hungry to wait on the hospital food. He had a mountain dew and a giant sugar soda from [employer].  We talked about the importance of minimizing sugar intake to reduce inflammation.    Restart Eliquis tomorrow.  Larae Grooms, MD

## 2016-12-20 NOTE — H&P (View-Only) (Signed)
Progress Note  Patient Name: Aaron Dodson Date of Encounter: 12/19/2016  Primary Cardiologist: Dr. Candee Furbish  Subjective   No chest pain now. Laying in bed. Going to cath Monday.   Inpatient Medications    Scheduled Meds: . aspirin EC  81 mg Oral Daily  . Chlorhexidine Gluconate Cloth  6 each Topical Q0600  . clopidogrel  75 mg Oral Daily  . HYDROcodone-acetaminophen  1 tablet Oral QID  . metoprolol succinate  25 mg Oral Daily  . mupirocin ointment  1 application Nasal BID  . nicotine  21 mg Transdermal Daily  . pantoprazole  20 mg Oral Daily  . rosuvastatin  5 mg Oral QHS   Continuous Infusions: . sodium chloride    . heparin 1,350 Units/hr (12/19/16 0055)   PRN Meds: acetaminophen, ALPRAZolam, alum & mag hydroxide-simeth, morphine injection, nitroGLYCERIN, ondansetron (ZOFRAN) IV, zolpidem   Vital Signs    Vitals:   12/18/16 0500 12/18/16 1500 12/18/16 2209 12/19/16 0617  BP: 122/65 124/84 111/69 107/62  Pulse: 78 91 96 93  Resp: 16 14    Temp: 98 F (36.7 C) 98.7 F (37.1 C) 97.8 F (36.6 C) 98.4 F (36.9 C)  TempSrc: Oral Oral Oral Oral  SpO2: 96% 98% 100% 97%  Weight: 173 lb 1.6 oz (78.5 kg)   180 lb 8 oz (81.9 kg)  Height:       No intake or output data in the 24 hours ending 12/19/16 1136 Filed Weights   12/17/16 1650 12/18/16 0500 12/19/16 0617  Weight: 173 lb 11.6 oz (78.8 kg) 173 lb 1.6 oz (78.5 kg) 180 lb 8 oz (81.9 kg)    Telemetry    I personally reviewed telemetry which shows sinus rhythm and sinus tachycardia.  ECG    I personally reviewed the tracing from 12/18/2016 which shows normal sinus rhythm.  Physical Exam   GEN: No acute distress.   Neck: No JVD Cardiac: RRR, no murmurs, rubs, or gallops.  Respiratory: Clear to auscultation bilaterally. GI: Soft, nontender, non-distended  MS: No edema; Left AKA.   Labs    Chemistry  Recent Labs Lab 12/17/16 0542 12/18/16 0036 12/19/16 0409  NA 138 140 136  K 3.8 3.6 3.6  CL  103 102 102  CO2 22 28 25   GLUCOSE 104* 93 95  BUN 8 13 10   CREATININE 1.08 1.08 1.06  CALCIUM 9.8 9.6 9.0  PROT 7.1  --   --   ALBUMIN 3.8  --   --   AST 19  --   --   ALT 13*  --   --   ALKPHOS 98  --   --   BILITOT 0.5  --   --   GFRNONAA >60 >60 >60  GFRAA >60 >60 >60  ANIONGAP 13 10 9      Hematology  Recent Labs Lab 12/17/16 0542 12/18/16 0036 12/19/16 0409  WBC 9.7 7.8 7.9  RBC 4.42 4.15* 3.92*  HGB 14.2 13.1 12.5*  HCT 41.8 39.3 37.1*  MCV 94.6 94.7 94.6  MCH 32.1 31.6 31.9  MCHC 34.0 33.3 33.7  RDW 14.3 14.0 14.0  PLT 452* 405* 327    Cardiac Enzymes  Recent Labs Lab 12/17/16 0842 12/17/16 1324 12/17/16 1822 12/18/16 0036  TROPONINI <0.03 <0.03 <0.03 <0.03     Recent Labs Lab 12/16/16 1405 12/17/16 0556  TROPIPOC 0.00 0.00     Radiology    No results found.  Cardiac Studies   Echocardiogram 11/09/2016: Study Conclusions  -  Left ventricle: The cavity size was normal. Wall thickness was   increased in a pattern of mild LVH. Systolic function was normal.   The estimated ejection fraction was in the range of 55% to 60%.   Wall motion was normal; there were no regional wall motion   abnormalities. Doppler parameters are consistent with abnormal   left ventricular relaxation (grade 1 diastolic dysfunction). - Aortic valve: Poorly visualized. Mildly calcified leaflets. There   was no stenosis. Mean gradient (S): 8 mm Hg. - Aorta: Mildly dilated aortic root. Aortic root dimension: 38 mm   (ED). - Mitral valve: There was no significant regurgitation. - Right ventricle: The cavity size was normal. Systolic function   was normal. - Pulmonary arteries: No complete TR doppler jet so unable to   estimate PA systolic pressure. - Inferior vena cava: The vessel was normal in size. The   respirophasic diameter changes were in the normal range (>= 50%),   consistent with normal central venous pressure.  Impressions:  - Normal LV size with mild  LV hypertrophy. EF 55-60%. Normal RV   size and systolic function. No significant valvular abnormality   (aortic valve poorly visualized but no doppler evidence for   significant stenosis or regurgitation).  Patient Profile     51 y.o. male with a history of CAD status post prior stent interventions to the LAD, OM1, and RCA. Last cardiac catheterization at this facility in January 2007 revealed patent stent site with nonobstructive disease. He presents with recurring chest pain, suspected unstable angina with normal troponin I levels and has been scheduled for cardiac catheterization early next week by Dr. Angelena Form.  Assessment & Plan    1. Unstable angina, normal troponin I levels. ECG without acute changes. Recurrent CP.   2. CAD status post previous stent interventions to the LAD, OM1, and RCA, patent with mild atherosclerosis by angiography last year.  3. Hyperlipidemia, on Crestor.  4. Anxiety disorder with PTSD and ADD.  5. History of positive blood cultures for viridans streptococcus, felt to be contaminant. Repeat cultures are NGTD. He is afebrile and without leukocytosis.  Scheduled for diagnostic cardiac catheterization per Dr. Angelena Form for Monday. Continue on IV heparin, aspirin, Plavix, Toprol-XL, and Crestor.  Signed, Candee Furbish, MD  12/19/2016, 11:36 AM

## 2016-12-20 NOTE — Interval H&P Note (Signed)
Cath Lab Visit (complete for each Cath Lab visit)  Clinical Evaluation Leading to the Procedure:   ACS: No.  Non-ACS:    Anginal Classification: CCS III  Anti-ischemic medical therapy: Maximal Therapy (2 or more classes of medications)  Non-Invasive Test Results: No non-invasive testing performed  Prior CABG: No previous CABG      History and Physical Interval Note:  12/20/2016 7:40 AM  Aaron Dodson  has presented today for surgery, with the diagnosis of cp  The various methods of treatment have been discussed with the patient and family. After consideration of risks, benefits and other options for treatment, the patient has consented to  Procedure(s): Left Heart Cath and Coronary Angiography (N/A) as a surgical intervention .  The patient's history has been reviewed, patient examined, no change in status, stable for surgery.  I have reviewed the patient's chart and labs.  Questions were answered to the patient's satisfaction.     Shelva Majestic

## 2016-12-20 NOTE — Care Management Note (Addendum)
Case Management Note  Patient Details  Name: Aaron Dodson MRN: [id number] Date of Birth: 11/23/1965  Subjective/Objective:  Received referral to assist pt find PCP.  He was following with physician @ [facility] but no longer will follow with that group.  States he would like to follow with [facility].  CM placed call to [facility] - appt arranged for Friday, 2/16, @ 2:30 p.m.                          Expected Discharge Plan:  Home/Self Care  Discharge planning Services  CM Consult  Status of Service:  Completed, signed off  Girard Cooter, [place] 12/20/2016, 2:32 PM

## 2016-12-20 NOTE — Progress Notes (Signed)
Progress Note  Patient Name: Aaron Dodson Date of Encounter: 12/20/2016  Primary Cardiologist: Dr. Marlou Porch  Subjective   Post cath with stable CAD treat medically  He has been on xanax every night.  Needs PCP for anxiety management.   Inpatient Medications    Scheduled Meds: . [START ON 12/21/2016] aspirin EC  81 mg Oral Daily  . Chlorhexidine Gluconate Cloth  6 each Topical Q0600  . clopidogrel  75 mg Oral Daily  . HYDROcodone-acetaminophen  1 tablet Oral QID  . metoprolol succinate  25 mg Oral Daily  . mupirocin ointment  1 application Nasal BID  . nicotine  21 mg Transdermal Daily  . pantoprazole  20 mg Oral Daily  . rosuvastatin  5 mg Oral QHS   Continuous Infusions: . sodium chloride    . heparin 1,350 Units/hr (12/19/16 2042)   PRN Meds: acetaminophen, ALPRAZolam, alum & mag hydroxide-simeth, morphine injection, nitroGLYCERIN, ondansetron (ZOFRAN) IV, zolpidem   Vital Signs    Vitals:   12/20/16 0834 12/20/16 0839 12/20/16 0844 12/20/16 0849  BP: 110/75 114/78 107/71 99/66  Pulse: 91 89 86 87  Resp: (!) 9 (!) 9 14 11   Temp:      TempSrc:      SpO2: 96% 96% 98% 98%  Weight:      Height:        Intake/Output Summary (Last 24 hours) at 12/20/16 1042 Last data filed at 12/20/16 0743  Gross per 24 hour  Intake              482 ml  Output                0 ml  Net              482 ml   Filed Weights   12/18/16 0500 12/19/16 0617 12/20/16 0533  Weight: 173 lb 1.6 oz (78.5 kg) 180 lb 8 oz (81.9 kg) 179 lb 10.8 oz (81.5 kg)    Telemetry    SR - to ST at 120  Personally Reviewed  ECG    ST normal EKG - Personally Reviewed  Physical Exam   GEN: No acute distress.  eating mcDonald's  Neck: No JVD Cardiac: RRR, no murmurs, rubs, or gallops.  Respiratory: Clear to auscultation bilaterally. GI: Soft, nontender, non-distended  MS: No edema;Lt AKA Neuro:  Nonfocal  Psych: Normal affect   Labs    Chemistry Recent Labs Lab 12/17/16 0542  12/18/16 0036 12/19/16 0409  NA 138 140 136  K 3.8 3.6 3.6  CL 103 102 102  CO2 22 28 25   GLUCOSE 104* 93 95  BUN 8 13 10   CREATININE 1.08 1.08 1.06  CALCIUM 9.8 9.6 9.0  PROT 7.1  --   --   ALBUMIN 3.8  --   --   AST 19  --   --   ALT 13*  --   --   ALKPHOS 98  --   --   BILITOT 0.5  --   --   GFRNONAA >60 >60 >60  GFRAA >60 >60 >60  ANIONGAP 13 10 9      Hematology Recent Labs Lab 12/17/16 0542 12/18/16 0036 12/19/16 0409  WBC 9.7 7.8 7.9  RBC 4.42 4.15* 3.92*  HGB 14.2 13.1 12.5*  HCT 41.8 39.3 37.1*  MCV 94.6 94.7 94.6  MCH 32.1 31.6 31.9  MCHC 34.0 33.3 33.7  RDW 14.3 14.0 14.0  PLT 452* 405* 327    Cardiac Enzymes  Recent Labs Lab 12/17/16 1324 12/17/16 1822 12/18/16 0036 12/19/16 1810  TROPONINI <0.03 <0.03 <0.03 <0.03    Recent Labs Lab 12/16/16 1405 12/17/16 0556  TROPIPOC 0.00 0.00     BNPNo results for input(s): BNP, PROBNP in the last 168 hours.   DDimer No results for input(s): DDIMER in the last 168 hours.   Radiology    No results found.  Cardiac Studies   12/20/16  Cath Left Heart Cath and Coronary Angiography  Conclusion     Ost RCA to 4th RCA lesion, 10 %stenosed.  Mid RCA lesion, 30 %stenosed.  Dist LAD lesion, 15 %stenosed.  Ost LAD lesion, 20 %stenosed.  Mid LAD lesion, 20 %stenosed.  Ost 1st Mrg to 1st Mrg lesion, 0 %stenosed.  Ost Cx to Prox Cx lesion, 0 %stenosed.  The left ventricular systolic function is normal.  LV end diastolic pressure is normal.  The left ventricular ejection fraction is 55-65% by visual estimate.   Normal LV function with an ejection fraction of 55-60% without focal segmental wall motion abnormalities.  Mild nonobstructive residual CAD with coronary calcification of the proximal LAD with 20% proximal stenosis, 20% mid stenosis, and a patent distal LAD stent with 10-15% intimal hyperplasia; widely patent stent extending just beyond the ostium of the circumflex vessel into the OM1  vessel with jailing of a small branch of this marginal vessel; and extensive stenting to a dominant RCA with mild 30% focal mid stent narrowing.  RECOMMENDATION: Mediical therapy.     Patient Profile     51 y.o. male with a history of CAD status post prior stent interventions to the LAD, OM1, and RCA. PAF on Eliquis, Last cardiac catheterization at this facility in January 2007 revealed patent stent site with nonobstructive disease. He presents with recurring chest pain, suspected unstable angina with normal troponin I levels and has been scheduled for cardiac catheterization early next week by Dr. Claiborne Billings  Assessment & Plan    1. Unstable angina, neg troponins, but this has awakened from sleep at least twice, cath with nonobstructive disease.  Medical therapy, the symptoms may be more due to anxiety.  Possible discharge today?  2.  CAD with hx of s/p remote PCI with stenting in [place] in the past (LAD, OM1 and RCA stents). Last cardiac cath was done here [facility] in 11/2015 after multiple presentations to the ED for chest pain. LHC revealed nonobstructive CAD and medical therapy was recommended.  Repeat cath today was similar, non obstructive disease and treat medically.   3.  Anxiety disorder and PTSD and ADD- he had Xanax from ER visit yesterday and he has been on for some time, but recently ran out.  Needs PCP for management.  ? Psych consult will ask care manager plan for outpt PCP  4.  + blood cultures with VIRIDANS STREPTOCOCCUS--  rechecked blood cultures -results pending but no growth at this point was thought Thought to be contaminate as was found in 1 bottle and is normal skin flora-- if + then reconsult Dr. Danford Bad.  5.  Lt AKA with prosthesis.    6.  HLD on crestor.   7. HTN, elevated yesterday but on higher dose of BB BP was in the 80-90s,  BP controlled.   8. PAF on Eliquis was held for cath, resume tonight or AM?  Maintaining SR.  9. Tobacco use, on  nicoderm patch   Signed, Cecilie Kicks, NP  12/20/2016, 10:42 AM    I have  examined the patient and reviewed assessment and plan and discussed with patient.  Agree with above as stated.  Right groin stable.  Plan discharge later today.  Stressed importance of dietary changes.  He drinks a lot of soda, and had [employer] brought to his room immediately after his cath, because he was too hungry to wait on the hospital food. He had a mountain dew and a giant sugar soda from [employer].  Restart Eliquis tomorrow.  Larae Grooms

## 2016-12-21 MED FILL — Verapamil HCl IV Soln 2.5 MG/ML: INTRAVENOUS | Qty: 2 | Status: AC

## 2016-12-22 LAB — CULTURE, BLOOD (ROUTINE X 2)
CULTURE: NO GROWTH
CULTURE: NO GROWTH

## 2016-12-24 ENCOUNTER — Inpatient Hospital Stay: Payer: Medicare HMO | Admitting: Family Medicine

## 2017-01-05 ENCOUNTER — Ambulatory Visit: Payer: Medicare HMO | Admitting: Cardiology

## 2017-01-11 ENCOUNTER — Ambulatory Visit: Payer: Medicare HMO | Admitting: Cardiology

## 2017-01-11 NOTE — Progress Notes (Deleted)
Cardiology Office Note   Date:  01/11/2017   ID:  Aaron Dodson, DOB Jul 03, 1966, MRN [id number]  PCP:  No PCP Per Patient  Cardiologist:  Dr. Marlou Porch    No chief complaint on file.     History of Present Illness: Aaron Dodson is a 51 y.o. male who presents for post hospital hx of CAD s/p remote PCI with stenting in [place] and [place] in the past (LAD, OM1 and RCA stents). Last cardiac cath was done here [facility] in 11/2015 after multiple presentations to the ED for chest pain. LHC revealed nonobstructive CAD and medical therapy was recommended. His other hx includes HTN, HL, prior stroke, prior IV drug abuse and endocarditis in 2016, chronic low back pain (takes opioids), anxiety and reported atrial fibrillation (although this has not been documented). 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   Pt had recurrent chest pain this year and had eventual cardiac cath.  The cath was stable, non obstructive disease and medical therapy.  He had no atrial fib during hospital stay.  Concern that some of the pain may be related to anxiety.  He had come off Xanax.  He continued on Eliquis.  He was instructed to stop smoking.    Today***     Past Medical History:  Diagnosis Date  . ADD (attention deficit disorder)   . Anxiety   . Chest pain, cath with non obstructive CAD 12/20/16 11/18/2015  . Chronic lower back pain   . Club foot   . COPD (chronic obstructive pulmonary disease) ([place])   . Coronary artery disease    a. s/p multiple prior PCIs (LAD, LCx, OM1 - [place]; [place]) // b. LHC 1/17: LM 30, mLAD 20, dLAD stent patent, oLCx 40 within stent but o/w patent with 50-70 beyond stent (jailed), OM1 stent patent, oRCA stent patent with 20 ISR, EF 55-65 // c. Echo 1/17: EF 50-55, no RWMA, Gr 1 DD, mild AI  . Endocarditis    hx of IV drug abuse; tx At [facility] 2016  . GERD (gastroesophageal reflux disease)   . History of  echocardiogram    Echo 11/09/16: mild LVH, EF 55-60, no RWMA, Gr 1 DD, mildly dilated aortic root (38 mm)  . Hyperlipidemia   . Hypertension   . Myocardial infarction 2000's - ~ 04/2015   "I've had a total of 3" (11/08/2016)  . PAF (paroxysmal atrial fibrillation) (HCC)    on Eliquis  . PAF (paroxysmal atrial fibrillation) ([place]) on Eliquis 12/20/2016  . PTSD (post-traumatic stress disorder)   . Pulmonary embolism ([place])    "I've had 2 or 3" (11/08/2016)  . 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 Squamous cell carcinoma of scalp   . Stroke [facility]) ~ 2005-2006 X 2   denies residual on 11/08/2016    Past Surgical History:  Procedure Laterality Date  . ABOVE KNEE LEG AMPUTATION Left   . CARDIAC CATHETERIZATION  "several"  . CARDIAC CATHETERIZATION N/A 11/26/2015   Procedure: Left Heart Cath and Coronary Angiography;  Surgeon: Burnell Blanks, MD;  Location: [facility];  Service: Cardiovascular;  Laterality: N/A;  . CLUB FOOT RELEASE Left 03/04/67  . CORONARY ANGIOPLASTY  "several"  . CORONARY ANGIOPLASTY WITH STENT PLACEMENT  "several"   "total of 10 stents placed" (11/18/2015)  . LAPAROSCOPIC CHOLECYSTECTOMY    . LEFT HEART CATH AND CORONARY ANGIOGRAPHY N/A 12/20/2016   Procedure:  Left Heart Cath and Coronary Angiography;  Surgeon: Troy Sine, MD;  Location: [facility];  Service: Cardiovascular;  Laterality: N/A;  . ORTHOPEDIC SURGERY  02-02-67-~ 2007   "total of 52 on my left leg; started w/club foot released"  . TONSILLECTOMY       Current Outpatient Prescriptions  Medication Sig Dispense Refill  . acetaminophen (TYLENOL) 325 MG tablet Take 2 tablets (650 mg total) by mouth every 4 (four) hours as needed for headache or mild pain.    Marland Kitchen ALPRAZolam (XANAX) 0.5 MG tablet Take 1 tablet (0.5 mg total) by mouth at bedtime as needed for anxiety. 5 tablet 0  . apixaban (ELIQUIS) 5 MG TABS tablet Take 1 tablet (5 mg total) by mouth 2 (two) times daily.  60 tablet 6  . clopidogrel (PLAVIX) 75 MG tablet Take 1 tablet (75 mg total) by mouth daily. 30 tablet 0  . HYDROcodone-acetaminophen (NORCO) 10-325 MG tablet Take 1 tablet by mouth 4 (four) times daily. scheduled    . metoprolol succinate (TOPROL-XL) 25 MG 24 hr tablet Take 1 tablet (25 mg total) by mouth daily. 30 tablet 6  . nicotine (NICODERM CQ - DOSED IN MG/24 HOURS) 21 mg/24hr patch Place 1 patch (21 mg total) onto the skin daily. 28 patch 0  . nitroGLYCERIN (NITROSTAT) 0.4 MG SL tablet Place 1 tablet (0.4 mg total) under the tongue every 5 (five) minutes x 3 doses as needed for chest pain. 25 tablet 6  . pantoprazole (PROTONIX) 20 MG tablet Take 1 tablet (20 mg total) by mouth daily. 30 tablet 6  . rosuvastatin (CRESTOR) 5 MG tablet Take 5 mg by mouth at bedtime.      No current facility-administered medications for this visit.     Allergies:   Benadryl [diphenhydramine hcl]; Promethazine hcl; Augmentin [amoxicillin-pot clavulanate]; and Toradol [ketorolac tromethamine]    Social History:  The patient  reports that he has been smoking Cigarettes.  He has a 17.50 pack-year smoking history. He has never used smokeless tobacco. He reports that he drinks alcohol. He reports that he does not use drugs.   Family History:  The patient's ***family history includes Heart attack in his father; Hypertension in his father; Lung cancer in his father.    ROS:  General:no colds or fevers, no weight changes Skin:no rashes or ulcers HEENT:no blurred vision, no congestion CV:see HPI PUL:see HPI GI:no diarrhea constipation or melena, no indigestion GU:no hematuria, no dysuria MS:no joint pain, no claudication Neuro:no syncope, no lightheadedness Endo:no diabetes, no thyroid disease Wt Readings from Last 3 Encounters:  12/20/16 179 lb 10.8 oz (81.5 kg)  12/16/16 192 lb (87.1 kg)  12/16/16 200 lb (90.7 kg)     PHYSICAL EXAM: VS:  There were no vitals taken for this visit. , BMI There is no  height or weight on file to calculate BMI. General:Pleasant affect, NAD Skin:Warm and dry, brisk capillary refill HEENT:normocephalic, sclera clear, mucus membranes moist Neck:supple, no JVD, no bruits  Heart:S1S2 RRR without murmur, gallup, rub or click Lungs:clear without rales, rhonchi, or wheezes [id number], non tender, + BS, do not palpate liver spleen or masses Ext:no lower ext edema, 2+ pedal pulses, 2+ radial pulses Neuro:alert and oriented, MAE, follows commands, + facial symmetry    EKG:  EKG is ordered today. The ekg ordered today demonstrates ***   Recent Labs: 12/17/2016: ALT 13; Magnesium 2.0; TSH 0.695 12/19/2016: BUN 10; Creatinine, Ser 1.06; Hemoglobin 12.5; Platelets 327; Potassium 3.6; Sodium 136  Lipid Panel No results found for: CHOL, TRIG, HDL, CHOLHDL, VLDL, LDLCALC, LDLDIRECT     Other studies Reviewed: Additional studies/ records that were reviewed today include: ***.  Echo 11/09/16 Study Conclusions  - Left ventricle: The cavity size was normal. Wall thickness was increased in a pattern of mild LVH. Systolic function was normal. The estimated ejection fraction was in the range of 55% to 60%. Wall motion was normal; there were no regional wall motion abnormalities. Doppler parameters are consistent with abnormal left ventricular relaxation (grade 1 diastolic dysfunction). - Aortic valve: Poorly visualized. Mildly calcified leaflets. There was no stenosis. Mean gradient (S): 8 mm Hg. - Aorta: Mildly dilated aortic root. Aortic root dimension: 38 mm (ED). - Mitral valve: There was no significant regurgitation. - Right ventricle: The cavity size was normal. Systolic function was normal. - Pulmonary arteries: No complete TR doppler jet so unable to estimate PA systolic pressure. - Inferior vena cava: The vessel was normal in size. The respirophasic diameter changes were in the normal range (>= 50%), consistent with normal  central venous pressure.  Impressions:  - Normal LV size with mild LV hypertrophy. EF 55-60%. Normal RV size and systolic function. No significant valvular abnormality (aortic valve poorly visualized but no doppler evidence for significant stenosis or regurgitation).  Cardiac cath 11/2015 Procedures   Left Heart Cath and Coronary Angiography  Conclusion   1. Triple vessel CAD 2. The LAD has mild plaque in the proximal segment and a patent stent in the distal/apical segment without restenosis.  3. The Circumflex has an ostial stent that extends into OM1. There is 40% ostial narrowing within the stent. The continuation of the AV groove Circumflex beyond OM1 is jailed by the stent with 50-70% stenosis. This continuation branch of the AV groove Circumflex is small in caliber.  4. The RCA has patent stents extending from the ostium of the vessel down into the PDA. There is diffuse mild stent restenosis.  5. The left main has a distal 30% stenosis.  6. Normal LV systolic function  Recommendations: Continue medical management    Repeat cath 12/20/16 for recurrent chest pain   Conclusion     Ost RCA to 4th RCA lesion, 10 %stenosed.  Mid RCA lesion, 30 %stenosed.  Dist LAD lesion, 15 %stenosed.  Ost LAD lesion, 20 %stenosed.  Mid LAD lesion, 20 %stenosed.  Ost 1st Mrg to 1st Mrg lesion, 0 %stenosed.  Ost Cx to Prox Cx lesion, 0 %stenosed.  The left ventricular systolic function is normal.  LV end diastolic pressure is normal.  The left ventricular ejection fraction is 55-65% by visual estimate.   Normal LV function with an ejection fraction of 55-60% without focal segmental wall motion abnormalities.  Mild nonobstructive residual CAD with coronary calcification of the proximal LAD with 20% proximal stenosis, 20% mid stenosis, and a patent distal LAD stent with 10-15% intimal hyperplasia; widely patent stent extending just beyond the ostium of the circumflex  vessel into the OM1 vessel with jailing of a small branch of this marginal vessel; and extensive stenting to a dominant RCA with mild 30% focal mid stent narrowing.  RECOMMENDATION: Mediical therapy.     ASSESSMENT AND PLAN:  1.  ***   Current medicines are reviewed with the patient today.  The patient Has no concerns regarding medicines.  The following changes have been made:  See above Labs/ tests ordered today include:see above  Disposition:   FU:  see above  Signed, Cecilie Kicks, NP  01/11/2017 8:06 AM    [facility], [place], [place] Phone: [phone]; Fax: [phone]

## 2017-01-12 ENCOUNTER — Encounter: Payer: Self-pay | Admitting: Cardiology

## 2017-03-06 ENCOUNTER — Emergency Department (HOSPITAL_COMMUNITY)
Admission: EM | Admit: 2017-03-06 | Discharge: 2017-03-06 | Disposition: A | Discharge status: Home / Self Care | Payer: Medicare HMO | Attending: Emergency Medicine | Admitting: Emergency Medicine

## 2017-03-06 ENCOUNTER — Emergency Department (HOSPITAL_COMMUNITY): Payer: Medicare HMO

## 2017-03-06 ENCOUNTER — Encounter (HOSPITAL_COMMUNITY): Payer: Self-pay

## 2017-03-06 DIAGNOSIS — Z955 Presence of coronary angioplasty implant and graft: Secondary | ICD-10-CM | POA: Insufficient documentation

## 2017-03-06 DIAGNOSIS — I10 Essential (primary) hypertension: Secondary | ICD-10-CM | POA: Insufficient documentation

## 2017-03-06 DIAGNOSIS — F17210 Nicotine dependence, cigarettes, uncomplicated: Secondary | ICD-10-CM | POA: Diagnosis not present

## 2017-03-06 DIAGNOSIS — R0789 Other chest pain: Secondary | ICD-10-CM

## 2017-03-06 DIAGNOSIS — Z85828 Personal history of other malignant neoplasm of skin: Secondary | ICD-10-CM | POA: Diagnosis not present

## 2017-03-06 DIAGNOSIS — I2510 Atherosclerotic heart disease of native coronary artery without angina pectoris: Secondary | ICD-10-CM | POA: Diagnosis not present

## 2017-03-06 DIAGNOSIS — F909 Attention-deficit hyperactivity disorder, unspecified type: Secondary | ICD-10-CM | POA: Diagnosis not present

## 2017-03-06 DIAGNOSIS — R079 Chest pain, unspecified: Secondary | ICD-10-CM | POA: Diagnosis present

## 2017-03-06 DIAGNOSIS — Z7901 Long term (current) use of anticoagulants: Secondary | ICD-10-CM | POA: Insufficient documentation

## 2017-03-06 DIAGNOSIS — I252 Old myocardial infarction: Secondary | ICD-10-CM | POA: Insufficient documentation

## 2017-03-06 DIAGNOSIS — Z8673 Personal history of transient ischemic attack (TIA), and cerebral infarction without residual deficits: Secondary | ICD-10-CM | POA: Diagnosis not present

## 2017-03-06 DIAGNOSIS — J449 Chronic obstructive pulmonary disease, unspecified: Secondary | ICD-10-CM | POA: Diagnosis not present

## 2017-03-06 LAB — BASIC METABOLIC PANEL
Anion gap: 12 (ref 5–15)
CALCIUM: 9.4 mg/dL (ref 8.9–10.3)
CO2: 21 mmol/L — AB (ref 22–32)
CREATININE: 0.99 mg/dL (ref 0.61–1.24)
Chloride: 102 mmol/L (ref 101–111)
GFR calc Af Amer: >60 mL/min (ref >60)
GFR calc non Af Amer: >60 mL/min (ref >60)
Glucose, Bld: 98 mg/dL (ref 65–99)
Potassium: 3.7 mmol/L (ref 3.5–5.1)
Sodium: 135 mmol/L (ref 135–145)

## 2017-03-06 LAB — I-STAT TROPONIN, ED
TROPONIN I, POC: 0 ng/mL (ref 0.00–0.08)
TROPONIN I, POC: 0 ng/mL (ref 0.00–0.08)

## 2017-03-06 LAB — CBC
HCT: 41.9 % (ref 39.0–52.0)
Hemoglobin: 14.7 g/dL (ref 13.0–17.0)
MCH: 32.3 pg (ref 26.0–34.0)
MCHC: 35.1 g/dL (ref 30.0–36.0)
MCV: 92.1 fL (ref 78.0–100.0)
Platelets: 288 10*3/uL (ref 150–400)
RBC: 4.55 MIL/uL (ref 4.22–5.81)
RDW: 14.2 % (ref 11.5–15.5)
WBC: 6.6 10*3/uL (ref 4.0–10.5)

## 2017-03-06 MED ORDER — MORPHINE SULFATE (PF) 4 MG/ML IV SOLN
4.0000 mg | Freq: Once | INTRAVENOUS | Status: AC
  Administered 2017-03-06: 4 mg via INTRAVENOUS
  Filled 2017-03-06: qty 1

## 2017-03-06 MED ORDER — SODIUM CHLORIDE 0.9 % IV BOLUS (SEPSIS)
1000.0000 mL | Freq: Once | INTRAVENOUS | Status: AC
  Administered 2017-03-06: 1000 mL via INTRAVENOUS

## 2017-03-06 MED ORDER — LORAZEPAM 2 MG/ML IJ SOLN
1.0000 mg | Freq: Once | INTRAMUSCULAR | Status: AC
  Administered 2017-03-06: 1 mg via INTRAVENOUS
  Filled 2017-03-06: qty 1

## 2017-03-06 MED ORDER — LORAZEPAM 0.5 MG PO TABS: 0.5000 mg | ORAL_TABLET | Freq: Three times a day (TID) | ORAL | 0 refills | Status: DC | PRN

## 2017-03-06 MED ORDER — ONDANSETRON HCL 4 MG/2ML IJ SOLN
4.0000 mg | Freq: Once | INTRAMUSCULAR | Status: DC
  Filled 2017-03-06: qty 2

## 2017-03-06 NOTE — ED Provider Notes (Signed)
[place] DEPT Provider Note   CSN: [id number] Arrival date & time: 03/06/17  1328     History   Chief Complaint No chief complaint on file.   HPI Aaron Dodson is a 51 y.o. male.  Pt presents to the ED today with CP.  He has a significant heart history with 10 prior stents.  He developed CP around 0900.  He said that it did not go away, so he came in.  He was admitted in Feb for CP and had a cath on 12/20/16.  This is the result:  Reading physician: Troy Sine, MD Ordering physician: Troy Sine, MD Study date: 12/20/16 Physicians   Panel Physicians Referring Physician Case Authorizing Physician Troy Sine, MD (Primary)   Procedures   Left Heart Cath and Coronary Angiography Conclusion     Ost RCA to 4th RCA lesion, 10 %stenosed.  Mid RCA lesion, 30 %stenosed.  Dist LAD lesion, 15 %stenosed.  Ost LAD lesion, 20 %stenosed.  Mid LAD lesion, 20 %stenosed.  Ost 1st Mrg to 1st Mrg lesion, 0 %stenosed.  Ost Cx to Prox Cx lesion, 0 %stenosed.  The left ventricular systolic function is normal.  LV end diastolic pressure is normal.  The left ventricular ejection fraction is 55-65% by visual estimate.   Normal LV function with an ejection fraction of 55-60% without focal segmental wall motion abnormalities.  Mild nonobstructive residual CAD with coronary calcification of the proximal LAD with 20% proximal stenosis, 20% mid stenosis, and a patent distal LAD stent with 10-15% intimal hyperplasia; widely patent stent extending just beyond the ostium of the circumflex vessel into the OM1 vessel with jailing of a small branch of this marginal vessel; and extensive stenting to a dominant RCA with mild 30% focal mid stent narrowing.  RECOMMENDATION: Mediical therapy.  Pt feels like this is anxiety as he has a teenage girl who is stressful, but he wanted to make sure it was not his heart.      Past Medical History:  Diagnosis Date  . ADD (attention  deficit disorder)   . Anxiety   . Chest pain, cath with non obstructive CAD 12/20/16 11/18/2015  . Chronic lower back pain   . Club foot   . COPD (chronic obstructive pulmonary disease) ([place])   . Coronary artery disease    a. s/p multiple prior PCIs (LAD, LCx, OM1 - [place]; [place]) // b. LHC 1/17: LM 30, mLAD 20, dLAD stent patent, oLCx 40 within stent but o/w patent with 50-70 beyond stent (jailed), OM1 stent patent, oRCA stent patent with 20 ISR, EF 55-65 // c. Echo 1/17: EF 50-55, no RWMA, Gr 1 DD, mild AI  . Endocarditis    hx of IV drug abuse; tx At [facility] 2016  . GERD (gastroesophageal reflux disease)   . History of echocardiogram    Echo 11/09/16: mild LVH, EF 55-60, no RWMA, Gr 1 DD, mildly dilated aortic root (38 mm)  . Hyperlipidemia   . Hypertension   . Myocardial infarction [facility]) 2000's - ~ 04/2015   "I've had a total of 3" (11/08/2016)  . PAF (paroxysmal atrial fibrillation) (HCC)    on Eliquis  . PAF (paroxysmal atrial fibrillation) ([place]) on Eliquis 12/20/2016  . PTSD (post-traumatic stress disorder)   . Pulmonary embolism ([place])    "I've had 2 or 3" (11/08/2016)  . 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 Squamous cell carcinoma of scalp   . Stroke ([place]) ~  2005-2006 X 2   denies residual on 11/08/2016    Patient Active Problem List   Diagnosis Date Noted  . PAF (paroxysmal atrial fibrillation) ([place]) on Eliquis 12/20/2016  . Unstable angina ([place]) 12/17/2016  . CAD in native artery   . CAD s/p PCI 11/19/2015  . Anxiety state   . Chest pain, cath with non obstructive CAD 12/20/16 11/18/2015  . HLD (hyperlipidemia) 11/18/2015  . Essential hypertension 11/18/2015  . Tobacco abuse 11/18/2015    Past Surgical History:  Procedure Laterality Date  . ABOVE KNEE LEG AMPUTATION Left   . CARDIAC CATHETERIZATION  "several"  . CARDIAC CATHETERIZATION N/A 11/26/2015   Procedure: Left Heart Cath and Coronary Angiography;  Surgeon: Burnell Blanks, MD;   Location: [place];  Service: Cardiovascular;  Laterality: N/A;  . CLUB FOOT RELEASE Left 1967-06-14  . CORONARY ANGIOPLASTY  "several"  . CORONARY ANGIOPLASTY WITH STENT PLACEMENT  "several"   "total of 10 stents placed" (11/18/2015)  . LAPAROSCOPIC CHOLECYSTECTOMY    . LEFT HEART CATH AND CORONARY ANGIOGRAPHY N/A 12/20/2016   Procedure: Left Heart Cath and Coronary Angiography;  Surgeon: Troy Sine, MD;  Location: [place] [facility];  Service: Cardiovascular;  Laterality: N/A;  . ORTHOPEDIC SURGERY  December 26, 1966-~ 2007   "total of 52 on my left leg; started w/club foot released"  . TONSILLECTOMY         Home Medications    Prior to Admission medications   Medication Sig Start Date End Date Taking? Authorizing Provider  acetaminophen (TYLENOL) 325 MG tablet Take 2 tablets (650 mg total) by mouth every 4 (four) hours as needed for headache or mild pain. 12/20/16  Yes Isaiah Serge, NP  ALPRAZolam Duanne Moron) 0.5 MG tablet Take 1 tablet (0.5 mg total) by mouth at bedtime as needed for anxiety. 12/16/16  Yes Courteney Lyn Mackuen, MD  apixaban (ELIQUIS) 5 MG TABS tablet Take 1 tablet (5 mg total) by mouth 2 (two) times daily. 12/21/16  Yes Isaiah Serge, NP  clopidogrel (PLAVIX) 75 MG tablet Take 1 tablet (75 mg total) by mouth daily. 07/22/16  Yes Jennifer Chahn-Yang Choi, DO  HYDROcodone-acetaminophen (NORCO) 10-325 MG tablet Take 1 tablet by mouth 4 (four) times daily. scheduled   Yes Historical Provider, MD  ibuprofen (ADVIL,MOTRIN) 200 MG tablet Take 200 mg by mouth every 6 (six) hours as needed for moderate pain.   Yes Historical Provider, MD  metoprolol succinate (TOPROL-XL) 25 MG 24 hr tablet Take 1 tablet (25 mg total) by mouth daily. 10/13/16  Yes Jerline Pain, MD  nicotine (NICODERM CQ - DOSED IN MG/24 HOURS) 21 mg/24hr patch Place 1 patch (21 mg total) onto the skin daily. 12/21/16  Yes Isaiah Serge, NP  nitroGLYCERIN (NITROSTAT) 0.4 MG SL tablet Place 1 tablet (0.4 mg total)  under the tongue every 5 (five) minutes x 3 doses as needed for chest pain. 12/20/16  Yes Isaiah Serge, NP  pantoprazole (PROTONIX) 20 MG tablet Take 1 tablet (20 mg total) by mouth daily. 12/21/16  Yes Isaiah Serge, NP  rosuvastatin (CRESTOR) 5 MG tablet Take 5 mg by mouth at bedtime.    Yes Historical Provider, MD  LORazepam (ATIVAN) 0.5 MG tablet Take 1 tablet (0.5 mg total) by mouth every 8 (eight) hours as needed for anxiety. 03/06/17   Isla Pence, MD    Family History Family History  Problem Relation Age of Onset  . Hypertension Father   . Lung cancer  Father   . Heart attack Father     Social History Social History  Substance Use Topics  . Smoking status: Current Some Day Smoker    Packs/day: 0.50    Years: 35.00    Types: Cigarettes  . Smokeless tobacco: Never Used     Comment: 12/17/2016 "using the patch"  . Alcohol use 0.0 oz/week     Comment: 12/17/2016 nothing since "~ 2011"     Allergies   Benadryl [diphenhydramine hcl]; Promethazine hcl; Augmentin [amoxicillin-pot clavulanate]; and Toradol [ketorolac tromethamine]   Review of Systems Review of Systems  Cardiovascular: Positive for chest pain.  All other systems reviewed and are negative.    Physical Exam Updated Vital Signs BP (!) 145/92   Pulse 92   Temp 97.7 F (36.5 C) (Oral)   Resp 17   Ht 6' (1.829 m)   Wt 200 lb (90.7 kg)   SpO2 98%   BMI 27.12 kg/m   Physical Exam  Constitutional: He is oriented to person, place, and time. He appears well-developed and well-nourished.  HENT:  Head: Normocephalic and atraumatic.  Right Ear: External ear normal.  Left Ear: External ear normal.  Nose: Nose normal.  Mouth/Throat: Oropharynx is clear and moist.  Eyes: Conjunctivae and EOM are normal. Pupils are equal, round, and reactive to light.  Neck: Normal range of motion. Neck supple.  Cardiovascular: Regular rhythm, normal heart sounds and intact distal pulses.  Tachycardia present.     Pulmonary/Chest: Effort normal and breath sounds normal.  Abdominal: Soft. Bowel sounds are normal.  Musculoskeletal:  Left aka  Neurological: He is alert and oriented to person, place, and time.  Skin: Skin is warm.  Psychiatric: He has a normal mood and affect. His behavior is normal. Judgment and thought content normal.  Nursing note and vitals reviewed.    ED Treatments / Results  Labs (all labs ordered are listed, but only abnormal results are displayed) Labs Reviewed  BASIC METABOLIC PANEL - Abnormal; Notable for the following:       Result Value   CO2 21 (*)    BUN <5 (*)    All other components within normal limits  CBC  I-STAT TROPOININ, ED  I-STAT TROPOININ, ED    EKG  EKG Interpretation  Date/Time:  Sunday March 06 2017 13:32:45 EDT Ventricular Rate:  108 PR Interval:  140 QRS Duration: 82 QT Interval:  332 QTC Calculation: 444 R Axis:   66 Text Interpretation:  Sinus tachycardia Otherwise normal ECG Confirmed by Montine Hight MD, Alexios Keown ([id number]) on 03/06/2017 1:55:05 PM       Radiology Dg Chest 2 View  Result Date: 03/06/2017 CLINICAL DATA:  Left-sided chest pain.  Coronary artery disease. EXAM: CHEST  2 VIEW COMPARISON:  12/17/2016 FINDINGS: The heart size and mediastinal contours are within normal limits. Both lungs are clear. The visualized skeletal structures are unremarkable. IMPRESSION: No active cardiopulmonary disease. Electronically Signed   By: Earle Gell M.D.   On: 03/06/2017 14:45    Procedures Procedures (including critical care time)  Medications Ordered in ED Medications  ondansetron (ZOFRAN) injection 4 mg (4 mg Intravenous Refused 03/06/17 1510)  sodium chloride 0.9 % bolus 1,000 mL (1,000 mLs Intravenous New Bag/Given 03/06/17 1509)  morphine 4 MG/ML injection 4 mg (4 mg Intravenous Given 03/06/17 1510)  LORazepam (ATIVAN) injection 1 mg (1 mg Intravenous Given 03/06/17 1509)     Initial Impression / Assessment and Plan / ED Course  I  have reviewed the triage  vital signs and the nursing notes.  Pertinent labs & imaging results that were available during my care of the patient were reviewed by me and considered in my medical decision making (see chart for details).    Pt's pain is atypical.  He just had a cardiac cath in Feb.  He has had 2 negative troponins.  He knows to f/u with his pcp.  Final Clinical Impressions(s) / ED Diagnoses   Final diagnoses:  Atypical chest pain    New Prescriptions New Prescriptions   LORAZEPAM (ATIVAN) 0.5 MG TABLET    Take 1 tablet (0.5 mg total) by mouth every 8 (eight) hours as needed for anxiety.     Isla Pence, MD 03/06/17 1600

## 2017-03-06 NOTE — ED Triage Notes (Signed)
Patient complains of dull chest pain with radiation through to back since 0900. No other associated symptoms. NAD. Alert and oriented

## 2017-04-02 ENCOUNTER — Encounter (HOSPITAL_COMMUNITY): Payer: Self-pay | Admitting: *Deleted

## 2017-04-02 ENCOUNTER — Other Ambulatory Visit: Payer: Self-pay

## 2017-04-02 ENCOUNTER — Emergency Department (HOSPITAL_COMMUNITY)
Admission: EM | Admit: 2017-04-02 | Discharge: 2017-04-02 | Disposition: A | Discharge status: Home / Self Care | Payer: Medicare HMO | Attending: Emergency Medicine | Admitting: Emergency Medicine

## 2017-04-02 ENCOUNTER — Emergency Department (HOSPITAL_COMMUNITY): Payer: Medicare HMO

## 2017-04-02 DIAGNOSIS — R0789 Other chest pain: Secondary | ICD-10-CM | POA: Insufficient documentation

## 2017-04-02 DIAGNOSIS — Z89612 Acquired absence of left leg above knee: Secondary | ICD-10-CM | POA: Insufficient documentation

## 2017-04-02 DIAGNOSIS — Z7901 Long term (current) use of anticoagulants: Secondary | ICD-10-CM | POA: Insufficient documentation

## 2017-04-02 DIAGNOSIS — Z79899 Other long term (current) drug therapy: Secondary | ICD-10-CM | POA: Insufficient documentation

## 2017-04-02 DIAGNOSIS — J449 Chronic obstructive pulmonary disease, unspecified: Secondary | ICD-10-CM | POA: Insufficient documentation

## 2017-04-02 DIAGNOSIS — Z955 Presence of coronary angioplasty implant and graft: Secondary | ICD-10-CM | POA: Diagnosis not present

## 2017-04-02 DIAGNOSIS — Z8673 Personal history of transient ischemic attack (TIA), and cerebral infarction without residual deficits: Secondary | ICD-10-CM | POA: Insufficient documentation

## 2017-04-02 DIAGNOSIS — R079 Chest pain, unspecified: Secondary | ICD-10-CM

## 2017-04-02 DIAGNOSIS — I252 Old myocardial infarction: Secondary | ICD-10-CM | POA: Diagnosis not present

## 2017-04-02 DIAGNOSIS — F909 Attention-deficit hyperactivity disorder, unspecified type: Secondary | ICD-10-CM | POA: Insufficient documentation

## 2017-04-02 DIAGNOSIS — F17210 Nicotine dependence, cigarettes, uncomplicated: Secondary | ICD-10-CM | POA: Diagnosis not present

## 2017-04-02 DIAGNOSIS — I2510 Atherosclerotic heart disease of native coronary artery without angina pectoris: Secondary | ICD-10-CM | POA: Diagnosis not present

## 2017-04-02 DIAGNOSIS — I10 Essential (primary) hypertension: Secondary | ICD-10-CM | POA: Insufficient documentation

## 2017-04-02 DIAGNOSIS — Z7982 Long term (current) use of aspirin: Secondary | ICD-10-CM | POA: Diagnosis not present

## 2017-04-02 LAB — BASIC METABOLIC PANEL
ANION GAP: 10 (ref 5–15)
BUN: 5 mg/dL — ABNORMAL LOW (ref 6–20)
CALCIUM: 9.3 mg/dL (ref 8.9–10.3)
CO2: 25 mmol/L (ref 22–32)
Chloride: 104 mmol/L (ref 101–111)
Creatinine, Ser: 1.02 mg/dL (ref 0.61–1.24)
GLUCOSE: 94 mg/dL (ref 65–99)
POTASSIUM: 3.8 mmol/L (ref 3.5–5.1)
SODIUM: 139 mmol/L (ref 135–145)

## 2017-04-02 LAB — I-STAT TROPONIN, ED
TROPONIN I, POC: 0 ng/mL (ref 0.00–0.08)
TROPONIN I, POC: 0 ng/mL (ref 0.00–0.08)

## 2017-04-02 LAB — CBC
HCT: 42.6 % (ref 39.0–52.0)
HEMOGLOBIN: 14.6 g/dL (ref 13.0–17.0)
MCH: 32.3 pg (ref 26.0–34.0)
MCHC: 34.3 g/dL (ref 30.0–36.0)
MCV: 94.2 fL (ref 78.0–100.0)
Platelets: 317 10*3/uL (ref 150–400)
RBC: 4.52 MIL/uL (ref 4.22–5.81)
RDW: 14.4 % (ref 11.5–15.5)
WBC: 6.9 10*3/uL (ref 4.0–10.5)

## 2017-04-02 MED ORDER — GI COCKTAIL ~~LOC~~
30.0000 mL | Freq: Once | ORAL | Status: AC
  Administered 2017-04-02: 30 mL via ORAL
  Filled 2017-04-02: qty 30

## 2017-04-02 MED ORDER — MORPHINE SULFATE (PF) 4 MG/ML IV SOLN
4.0000 mg | Freq: Once | INTRAVENOUS | Status: AC
  Administered 2017-04-02: 4 mg via INTRAVENOUS
  Filled 2017-04-02: qty 1

## 2017-04-02 NOTE — ED Notes (Signed)
IV team at bedside 

## 2017-04-02 NOTE — ED Notes (Signed)
Patient transported to X-ray 

## 2017-04-02 NOTE — ED Triage Notes (Addendum)
To ED for eval of left side chest pain for past couple of days. States he was out of town last week and noticed he didn't feel well at the beginning of the week. Pt took 3 nitro today without relief. Strong cardiac hx. ASA 325mg  taken today

## 2017-04-02 NOTE — Discharge Instructions (Signed)
Follow-up with your cardiologist, continue your current medications °

## 2017-04-02 NOTE — ED Provider Notes (Signed)
[place] DEPT Provider Note   CSN: [id number] Arrival date & time: 04/02/17  1744     History   Chief Complaint Chief Complaint  Patient presents with  . Chest Pain    HPI Aaron Dodson is a 51 y.o. male.  HPI Pt has a history of heart disease.  He started having pain in his chest today around 2pm.    The pain goes to his back and shoulder blades.  The pain has persisted and not resolved.  He has been having recurrent episodes of chest pain the last couple of months.  He had a cath in Feb 2018 that did not show any new occlusions or critical lesions.  His pain was felt to be non cardiac. No nausea but he does feel short of breath.  He took some NTG earlier today with some partial relief but it returned.  Past Medical History:  Diagnosis Date  . ADD (attention deficit disorder)   . Anxiety   . Chest pain, cath with non obstructive CAD 12/20/16 11/18/2015  . Chronic lower back pain   . Club foot   . COPD (chronic obstructive pulmonary disease) ([place])   . Coronary artery disease    a. s/p multiple prior PCIs (LAD, LCx, OM1 - [place]; [place]) // b. LHC 1/17: LM 30, mLAD 20, dLAD stent patent, oLCx 40 within stent but o/w patent with 50-70 beyond stent (jailed), OM1 stent patent, oRCA stent patent with 20 ISR, EF 55-65 // c. Echo 1/17: EF 50-55, no RWMA, Gr 1 DD, mild AI  . Endocarditis    hx of IV drug abuse; tx At [facility] 2016  . GERD (gastroesophageal reflux disease)   . History of echocardiogram    Echo 11/09/16: mild LVH, EF 55-60, no RWMA, Gr 1 DD, mildly dilated aortic root (38 mm)  . Hyperlipidemia   . Hypertension   . Myocardial infarction [facility]) 2000's - ~ 04/2015   "I've had a total of 3" (11/08/2016)  . PAF (paroxysmal atrial fibrillation) (HCC)    on Eliquis  . PAF (paroxysmal atrial fibrillation) ([place]) on Eliquis 12/20/2016  . PTSD (post-traumatic stress disorder)   . Pulmonary embolism ([place])    "I've had 2 or 3" (11/08/2016)  . 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 Squamous cell carcinoma of scalp   . Stroke [facility]) ~ 2005-2006 X 2   denies residual on 11/08/2016    Patient Active Problem List   Diagnosis Date Noted  . PAF (paroxysmal atrial fibrillation) ([place]) on Eliquis 12/20/2016  . Unstable angina ([place]) 12/17/2016  . CAD in native artery   . CAD s/p PCI 11/19/2015  . Anxiety state   . Chest pain, cath with non obstructive CAD 12/20/16 11/18/2015  . HLD (hyperlipidemia) 11/18/2015  . Essential hypertension 11/18/2015  . Tobacco abuse 11/18/2015    Past Surgical History:  Procedure Laterality Date  . ABOVE KNEE LEG AMPUTATION Left   . CARDIAC CATHETERIZATION  "several"  . CARDIAC CATHETERIZATION N/A 11/26/2015   Procedure: Left Heart Cath and Coronary Angiography;  Surgeon: Burnell Blanks, MD;  Location: [place] [facility];  Service: Cardiovascular;  Laterality: N/A;  . CLUB FOOT RELEASE Left 12-07-66  . CORONARY ANGIOPLASTY  "several"  . CORONARY ANGIOPLASTY WITH STENT PLACEMENT  "several"   "total of 10 stents placed" (11/18/2015)  . LAPAROSCOPIC CHOLECYSTECTOMY    . LEFT HEART CATH AND CORONARY ANGIOGRAPHY N/A 12/20/2016   Procedure: Left Heart Cath and  Coronary Angiography;  Surgeon: Troy Sine, MD;  Location: [place] [facility];  Service: Cardiovascular;  Laterality: N/A;  . ORTHOPEDIC SURGERY  07-Mar-1967-~ 2007   "total of 52 on my left leg; started w/club foot released"  . TONSILLECTOMY         Home Medications    Prior to Admission medications   Medication Sig Start Date End Date Taking? Authorizing Provider  acetaminophen (TYLENOL) 325 MG tablet Take 2 tablets (650 mg total) by mouth every 4 (four) hours as needed for headache or mild pain. 12/20/16  Yes Isaiah Serge, NP  apixaban (ELIQUIS) 5 MG TABS tablet Take 1 tablet (5 mg total) by mouth 2 (two) times daily. 12/21/16  Yes Isaiah Serge, NP  aspirin 325 MG EC tablet Take 325 mg by mouth every morning.   Yes [provider]    clopidogrel (PLAVIX) 75 MG tablet Take 1 tablet (75 mg total) by mouth daily. 07/22/16  Yes Dessa Phi Chahn-Yang, DO  cyclobenzaprine (FLEXERIL) 10 MG tablet Take 10 mg by mouth 3 (three) times daily as needed for muscle spasms.  03/10/17  Yes [provider]  HYDROcodone-acetaminophen (NORCO) 10-325 MG tablet Take 1 tablet by mouth 4 (four) times daily. SCHEDULED   Yes [provider]  ibuprofen (ADVIL,MOTRIN) 200 MG tablet Take 200 mg by mouth every 6 (six) hours as needed (for pain).    Yes [provider]  loperamide (IMODIUM A-D) 2 MG tablet Take 2-4 mg by mouth 4 (four) times daily as needed for diarrhea or loose stools.   Yes [provider]  LORazepam (ATIVAN) 0.5 MG tablet Take 1 tablet (0.5 mg total) by mouth every 8 (eight) hours as needed for anxiety. 03/06/17  Yes Isla Pence, MD  metoprolol succinate (TOPROL-XL) 25 MG 24 hr tablet Take 1 tablet (25 mg total) by mouth daily. 10/13/16  Yes Jerline Pain, MD  nitroGLYCERIN (NITROSTAT) 0.4 MG SL tablet Place 1 tablet (0.4 mg total) under the tongue every 5 (five) minutes x 3 doses as needed for chest pain. 12/20/16  Yes Isaiah Serge, NP  pantoprazole (PROTONIX) 20 MG tablet Take 1 tablet (20 mg total) by mouth daily. 12/21/16  Yes Isaiah Serge, NP  rosuvastatin (CRESTOR) 5 MG tablet Take 5 mg by mouth at bedtime.    Yes [provider]  ALPRAZolam Duanne Moron) 0.5 MG tablet Take 1 tablet (0.5 mg total) by mouth at bedtime as needed for anxiety. Patient not taking: Reported on 04/02/2017 12/16/16   Mackuen, Courteney Lyn, MD  nicotine (NICODERM CQ - DOSED IN MG/24 HOURS) 21 mg/24hr patch Place 1 patch (21 mg total) onto the skin daily. Patient not taking: Reported on 04/02/2017 12/21/16   Isaiah Serge, NP    Family History Family History  Problem Relation Age of Onset  . Hypertension Father   . Lung cancer Father   . Heart attack Father     Social History Social History  Substance  Use Topics  . Smoking status: Current Some Day Smoker    Packs/day: 0.50    Years: 35.00    Types: Cigarettes  . Smokeless tobacco: Never Used     Comment: 12/17/2016 "using the patch"  . Alcohol use 0.0 oz/week     Comment: 12/17/2016 nothing since "~ 2011"     Allergies   Benadryl [diphenhydramine hcl]; Promethazine hcl; Augmentin [amoxicillin-pot clavulanate]; and Toradol [ketorolac tromethamine]   Review of Systems Review of Systems  All other  systems reviewed and are negative.    Physical Exam Updated Vital Signs BP (!) 133/98   Pulse 82   Temp 98.6 F (37 C) (Oral)   Resp 16   Ht 1.829 m (6')   Wt 90.7 kg (200 lb)   SpO2 100%   BMI 27.12 kg/m   Physical Exam  Constitutional: He appears well-developed and well-nourished. No distress.  HENT:  Head: Normocephalic and atraumatic.  Right Ear: External ear normal.  Left Ear: External ear normal.  Eyes: Conjunctivae are normal. Right eye exhibits no discharge. Left eye exhibits no discharge. No scleral icterus.  Neck: Neck supple. No tracheal deviation present.  Cardiovascular: Normal rate, regular rhythm and intact distal pulses.   Pulmonary/Chest: Effort normal and breath sounds normal. No stridor. No respiratory distress. He has no wheezes. He has no rales.  Abdominal: Soft. Bowel sounds are normal. He exhibits no distension. There is no tenderness. There is no rebound and no guarding.  Musculoskeletal: He exhibits no edema or tenderness.  S/p bka left leg  Neurological: He is alert. He has normal strength. No cranial nerve deficit (no facial droop, extraocular movements intact, no slurred speech) or sensory deficit. He exhibits normal muscle tone. He displays no seizure activity. Coordination normal.  Skin: Skin is warm and dry. No rash noted.  Psychiatric: He has a normal mood and affect.  Nursing note and vitals reviewed.    ED Treatments / Results  Labs (all labs ordered are listed, but only abnormal results  are displayed) Labs Reviewed  BASIC METABOLIC PANEL - Abnormal; Notable for the following:       Result Value   BUN 5 (*)    All other components within normal limits  CBC  I-STAT TROPOININ, ED  I-STAT TROPOININ, ED    EKG  EKG Interpretation  Date/Time:  Saturday Apr 02 2017 17:49:33 EDT Ventricular Rate:  91 PR Interval:  136 QRS Duration: 84 QT Interval:  354 QTC Calculation: 435 R Axis:   72 Text Interpretation:  Normal sinus rhythm Normal ECG No significant change since last tracing Confirmed by Dorie Rank [phone]) on 04/02/2017 6:34:46 PM       Radiology Dg Chest 2 View  Result Date: 04/02/2017 CLINICAL DATA:  Left-sided chest pain EXAM: CHEST  2 VIEW COMPARISON:  03/07/2017 FINDINGS: The heart size and mediastinal contours are within normal limits. Both lungs are clear. The visualized skeletal structures are unremarkable. IMPRESSION: No active cardiopulmonary disease. Electronically Signed   By: Inez Catalina M.D.   On: 04/02/2017 19:19    Procedures Procedures (including critical care time)  Medications Ordered in ED Medications  morphine 4 MG/ML injection 4 mg (4 mg Intravenous Given 04/02/17 2025)  gi cocktail (Maalox,Lidocaine,Donnatal) (30 mLs Oral Given 04/02/17 1921)     Initial Impression / Assessment and Plan / ED Course  I have reviewed the triage vital signs and the nursing notes.  Pertinent labs & imaging results that were available during my care of the patient were reviewed by me and considered in my medical decision making (see chart for details).   The patient had a history of heart disease and also recurrent episodes of non cardiac chest pain.  Most recent cardiac cath without culprit lesions.  Serial troponins in the ED are normal.  NO acute EKG changes.  Stable for discharge at this time.  Discussed follow up with cardiology.  Final Clinical Impressions(s) / ED Diagnoses   Final diagnoses:  Chest pain, unspecified type  New  Prescriptions New Prescriptions   No medications on file     Dorie Rank, MD 04/02/17 2150

## 2017-04-10 IMAGING — CR DG CHEST 2V
2 series · 2 of 2 positions shown · non-contrast
Comparison: 11/08/2016

CLINICAL DATA: LEFT chest pain worse for 1 day, history coronary
artery disease post MI and coronary stenting, hypertension, COPD,
smoking

EXAM:
CHEST  2 VIEW

[w chest pa]
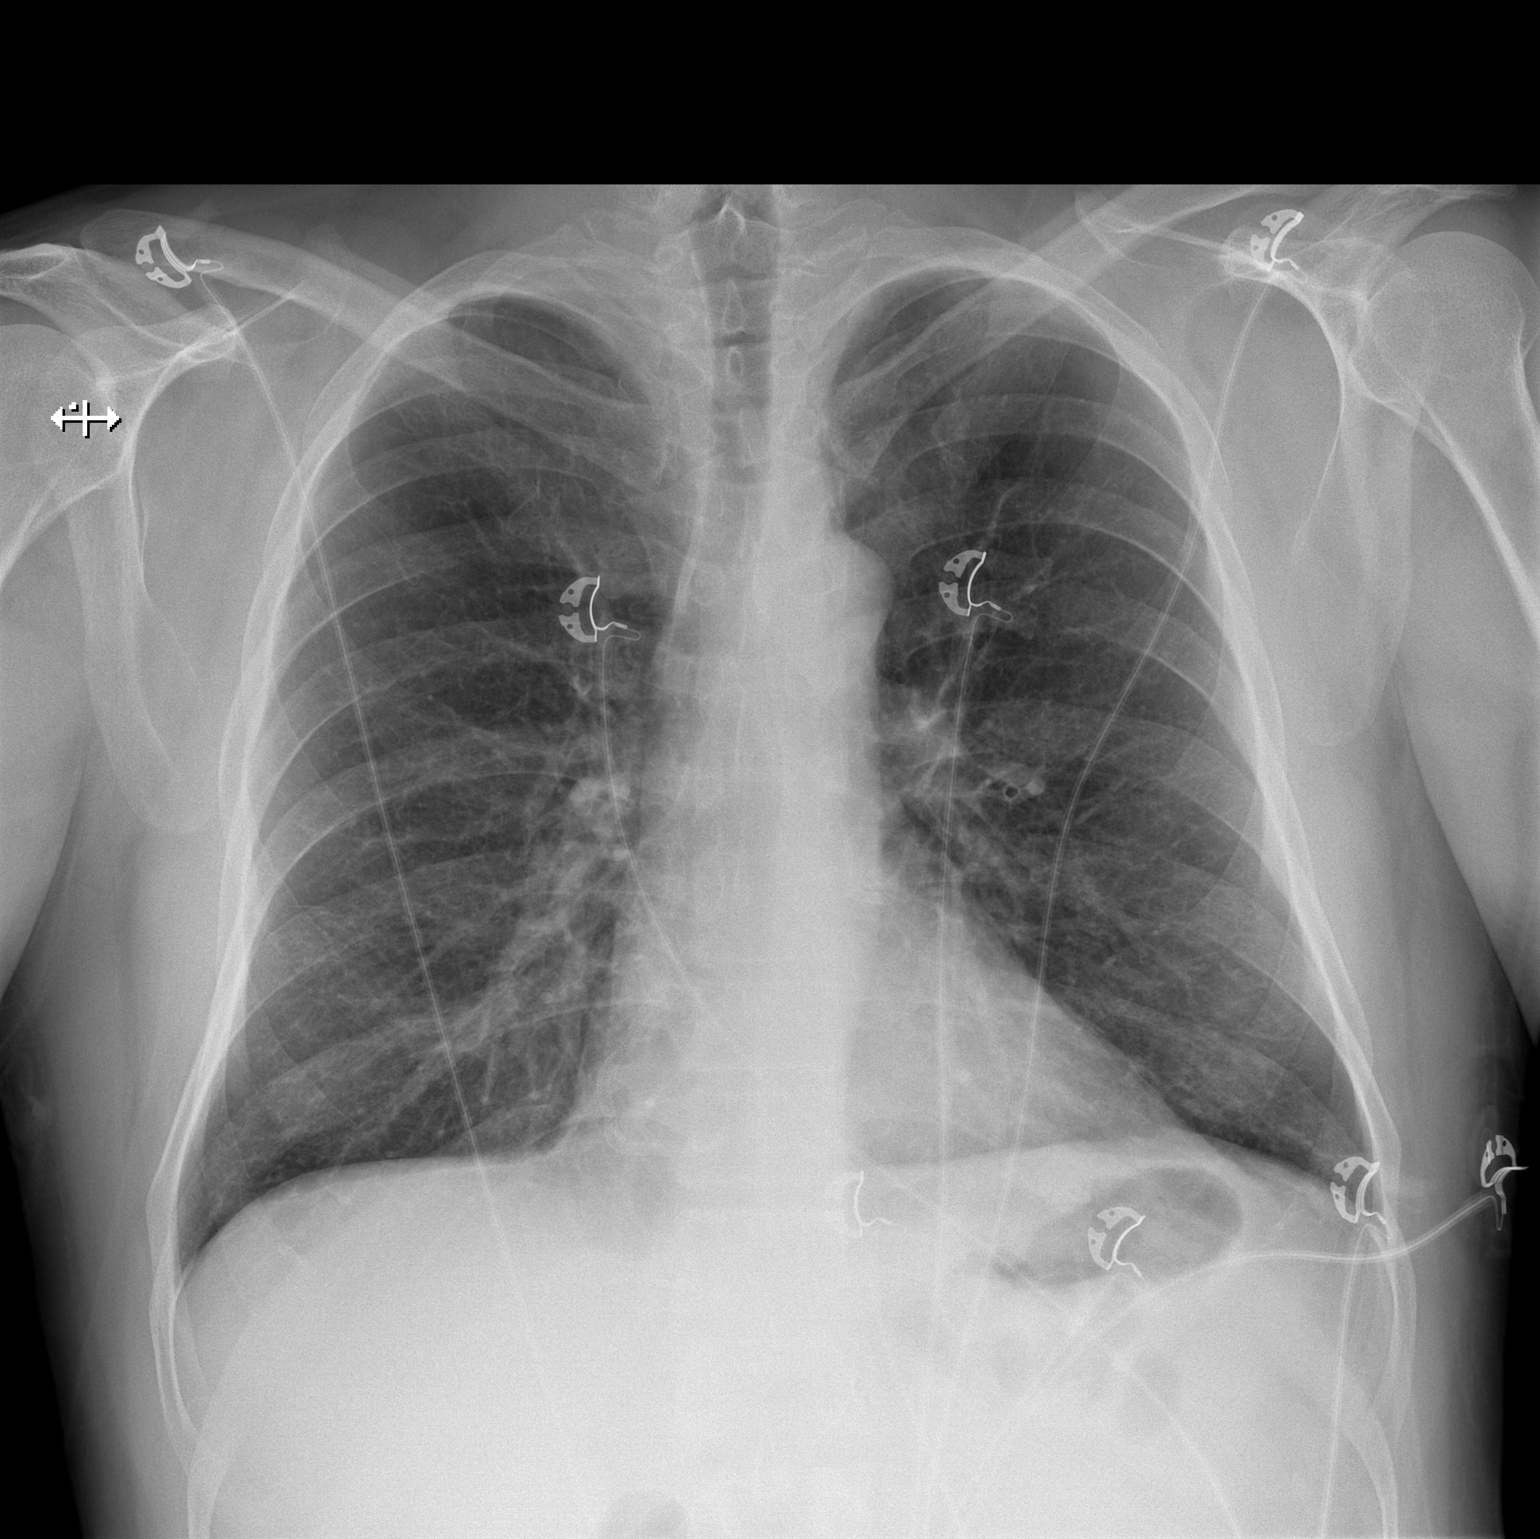

[w chest lat]
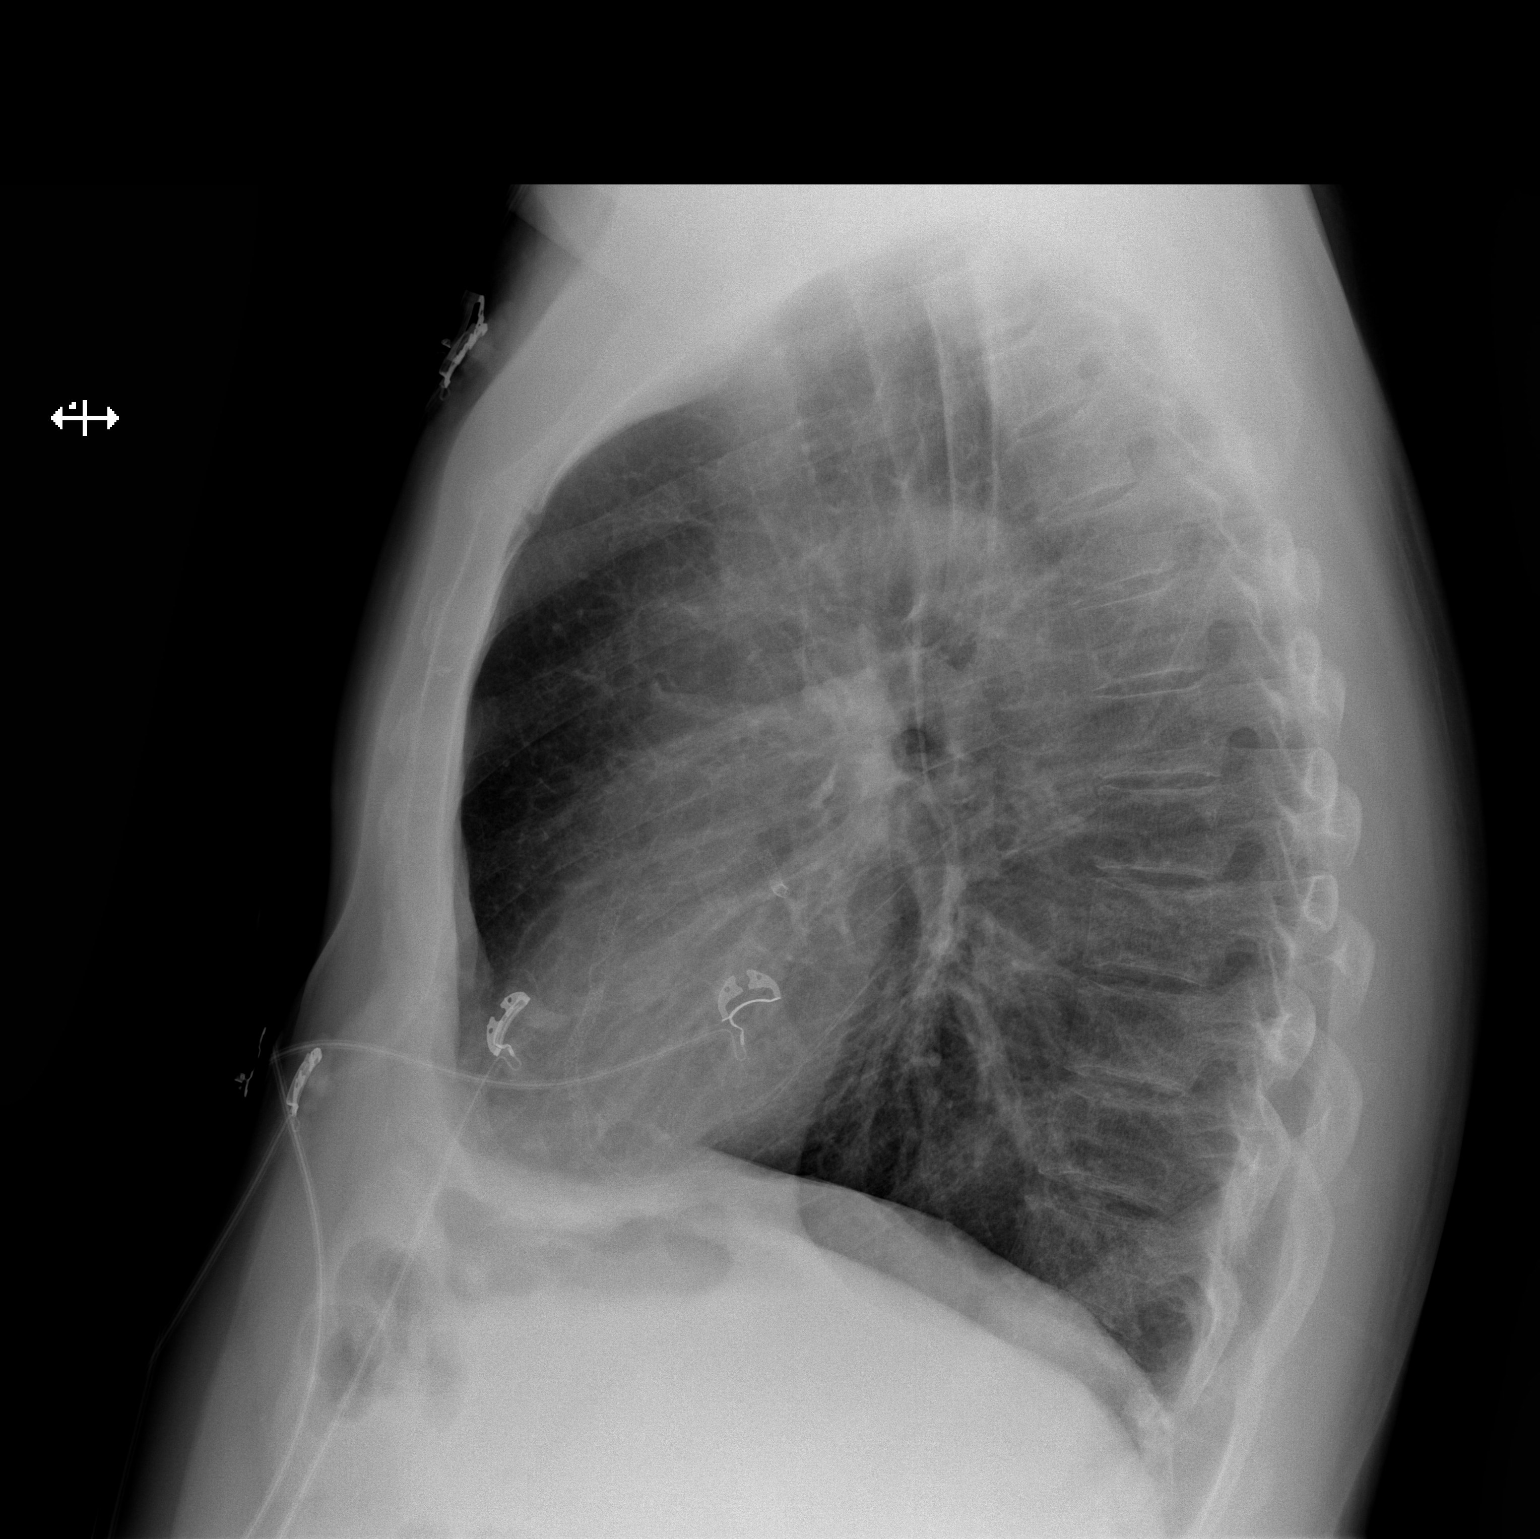

[2 of 2 positions shown; findings below may reference images not displayed]

FINDINGS: Normal heart size, mediastinal contours, and pulmonary vascularity.

Coronary arterial stents noted.

Chronic bronchitic changes without pulmonary infiltrate, pleural
effusion or pneumothorax.

No acute osseous findings.
IMPRESSION: Mild chronic bronchitic changes without infiltrate.

## 2017-04-30 ENCOUNTER — Emergency Department (HOSPITAL_COMMUNITY): Payer: Medicare HMO

## 2017-04-30 ENCOUNTER — Encounter (HOSPITAL_COMMUNITY): Payer: Self-pay | Admitting: Emergency Medicine

## 2017-04-30 ENCOUNTER — Emergency Department (HOSPITAL_COMMUNITY)
Admission: EM | Admit: 2017-04-30 | Discharge: 2017-04-30 | Disposition: A | Discharge status: Home / Self Care | Payer: Medicare HMO | Attending: Emergency Medicine | Admitting: Emergency Medicine

## 2017-04-30 ENCOUNTER — Other Ambulatory Visit: Payer: Self-pay

## 2017-04-30 DIAGNOSIS — Z880 Allergy status to penicillin: Secondary | ICD-10-CM | POA: Insufficient documentation

## 2017-04-30 DIAGNOSIS — R0789 Other chest pain: Secondary | ICD-10-CM

## 2017-04-30 DIAGNOSIS — J449 Chronic obstructive pulmonary disease, unspecified: Secondary | ICD-10-CM | POA: Diagnosis not present

## 2017-04-30 DIAGNOSIS — F17200 Nicotine dependence, unspecified, uncomplicated: Secondary | ICD-10-CM

## 2017-04-30 DIAGNOSIS — Z7901 Long term (current) use of anticoagulants: Secondary | ICD-10-CM | POA: Insufficient documentation

## 2017-04-30 DIAGNOSIS — Z79899 Other long term (current) drug therapy: Secondary | ICD-10-CM | POA: Insufficient documentation

## 2017-04-30 DIAGNOSIS — F17210 Nicotine dependence, cigarettes, uncomplicated: Secondary | ICD-10-CM | POA: Insufficient documentation

## 2017-04-30 DIAGNOSIS — R0602 Shortness of breath: Secondary | ICD-10-CM | POA: Insufficient documentation

## 2017-04-30 DIAGNOSIS — I252 Old myocardial infarction: Secondary | ICD-10-CM | POA: Diagnosis not present

## 2017-04-30 DIAGNOSIS — I10 Essential (primary) hypertension: Secondary | ICD-10-CM | POA: Diagnosis not present

## 2017-04-30 DIAGNOSIS — Z7902 Long term (current) use of antithrombotics/antiplatelets: Secondary | ICD-10-CM | POA: Diagnosis not present

## 2017-04-30 DIAGNOSIS — R5383 Other fatigue: Secondary | ICD-10-CM | POA: Insufficient documentation

## 2017-04-30 DIAGNOSIS — Z85828 Personal history of other malignant neoplasm of skin: Secondary | ICD-10-CM | POA: Diagnosis not present

## 2017-04-30 DIAGNOSIS — I25110 Atherosclerotic heart disease of native coronary artery with unstable angina pectoris: Secondary | ICD-10-CM | POA: Insufficient documentation

## 2017-04-30 DIAGNOSIS — Z8673 Personal history of transient ischemic attack (TIA), and cerebral infarction without residual deficits: Secondary | ICD-10-CM | POA: Diagnosis not present

## 2017-04-30 LAB — BASIC METABOLIC PANEL
ANION GAP: 10 (ref 5–15)
BUN: 5 mg/dL — ABNORMAL LOW (ref 6–20)
CHLORIDE: 106 mmol/L (ref 101–111)
CO2: 23 mmol/L (ref 22–32)
Calcium: 9.4 mg/dL (ref 8.9–10.3)
Creatinine, Ser: 1.21 mg/dL (ref 0.61–1.24)
GFR calc non Af Amer: >60 mL/min (ref >60)
Glucose, Bld: 111 mg/dL — ABNORMAL HIGH (ref 65–99)
Potassium: 3.7 mmol/L (ref 3.5–5.1)
SODIUM: 139 mmol/L (ref 135–145)

## 2017-04-30 LAB — HEPATIC FUNCTION PANEL
ALBUMIN: 4.2 g/dL (ref 3.5–5.0)
ALK PHOS: 129 U/L — AB (ref 38–126)
ALT: 10 U/L — ABNORMAL LOW (ref 17–63)
AST: 21 U/L (ref 15–41)
Bilirubin, Direct: <0.1 mg/dL — ABNORMAL LOW (ref 0.1–0.5)
TOTAL PROTEIN: 7.5 g/dL (ref 6.5–8.1)
Total Bilirubin: 0.7 mg/dL (ref 0.3–1.2)

## 2017-04-30 LAB — CBC
HCT: 40.1 % (ref 39.0–52.0)
HEMOGLOBIN: 13.8 g/dL (ref 13.0–17.0)
MCH: 32.6 pg (ref 26.0–34.0)
MCHC: 34.4 g/dL (ref 30.0–36.0)
MCV: 94.8 fL (ref 78.0–100.0)
PLATELETS: 294 10*3/uL (ref 150–400)
RBC: 4.23 MIL/uL (ref 4.22–5.81)
RDW: 14.1 % (ref 11.5–15.5)
WBC: 8.7 10*3/uL (ref 4.0–10.5)

## 2017-04-30 LAB — BRAIN NATRIURETIC PEPTIDE: B NATRIURETIC PEPTIDE 5: 35.8 pg/mL (ref 0.0–100.0)

## 2017-04-30 LAB — I-STAT TROPONIN, ED
TROPONIN I, POC: 0.01 ng/mL (ref 0.00–0.08)
TROPONIN I, POC: 0 ng/mL (ref 0.00–0.08)

## 2017-04-30 MED ORDER — OXYCODONE-ACETAMINOPHEN 5-325 MG PO TABS
2.0000 | ORAL_TABLET | Freq: Once | ORAL | Status: AC
  Administered 2017-04-30: 2 via ORAL
  Filled 2017-04-30: qty 2

## 2017-04-30 MED ORDER — MORPHINE SULFATE (PF) 4 MG/ML IV SOLN
4.0000 mg | Freq: Once | INTRAVENOUS | Status: AC
  Administered 2017-04-30: 4 mg via INTRAVENOUS
  Filled 2017-04-30: qty 1

## 2017-04-30 MED ORDER — ASPIRIN 81 MG PO CHEW
324.0000 mg | CHEWABLE_TABLET | Freq: Once | ORAL | Status: AC
  Administered 2017-04-30: 324 mg via ORAL
  Filled 2017-04-30: qty 4

## 2017-04-30 MED ORDER — NITROGLYCERIN 2 % TD OINT
1.0000 [in_us] | TOPICAL_OINTMENT | Freq: Once | TRANSDERMAL | Status: AC
  Administered 2017-04-30: 1 [in_us] via TOPICAL
  Filled 2017-04-30: qty 1

## 2017-04-30 MED ORDER — GI COCKTAIL ~~LOC~~
30.0000 mL | Freq: Once | ORAL | Status: AC
  Administered 2017-04-30: 30 mL via ORAL
  Filled 2017-04-30: qty 30

## 2017-04-30 NOTE — ED Notes (Signed)
Patient transported to X-ray 

## 2017-04-30 NOTE — ED Notes (Signed)
Pt stable, understands discharge instructions, and reasons for return.   

## 2017-04-30 NOTE — ED Notes (Signed)
In room to medicate patient.  Caught patient spitting percocet into his hand and trying to hide them.  When confronted states I can't take them unless they are cut in half.  Split with a pill splitter and swallowed.  States "I'm sorry I should have told you before I tried to swallow them".

## 2017-04-30 NOTE — ED Triage Notes (Signed)
Pt to ER for evaluation of left chest pressure onset this morning at 11 am although states "has not felt well since last night." reports fatigued and short of breath. States hx of 10 stents, in NAD at triage. VSS.

## 2017-04-30 NOTE — ED Provider Notes (Signed)
[facility] DEPT Provider Note   CSN: [id number] Arrival date & time: 04/30/17  1642     History   Chief Complaint Chief Complaint  Patient presents with  . Chest Pain    HPI Ivann Trimarco is a 51 y.o. male.  HPI   51 yo M with extensive PMHx including multiple PCIs with DES, ho IVDU, h/o pAFib on ASA who presents with CP. Pt states that his sx started around 9-10 AM this morning as he was waking up. He reports aching, gnawing, pressure like chest pain that is located in his mid chest. It radiates to his left upper shoulder. He has associated severe SOB and dyspnea. He endorses worsening HTN as well as orthopnea as well at home. His pain feels similar, but worse, to the pain he felt during his prior/recent ED evaluations. Of note, cath several months ago was unremarkable. He did have improvement in his pain with nitro. No worsening factors.  Past Medical History:  Diagnosis Date  . ADD (attention deficit disorder)   . Anxiety   . Chest pain, cath with non obstructive CAD 12/20/16 11/18/2015  . Chronic lower back pain   . Club foot   . COPD (chronic obstructive pulmonary disease) ([place])   . Coronary artery disease    a. s/p multiple prior PCIs (LAD, LCx, OM1 - [place]; [place]) // b. LHC 1/17: LM 30, mLAD 20, dLAD stent patent, oLCx 40 within stent but o/w patent with 50-70 beyond stent (jailed), OM1 stent patent, oRCA stent patent with 20 ISR, EF 55-65 // c. Echo 1/17: EF 50-55, no RWMA, Gr 1 DD, mild AI  . Endocarditis    hx of IV drug abuse; tx At [facility] 2016  . GERD (gastroesophageal reflux disease)   . History of echocardiogram    Echo 11/09/16: mild LVH, EF 55-60, no RWMA, Gr 1 DD, mildly dilated aortic root (38 mm)  . Hyperlipidemia   . Hypertension   . Myocardial infarction [facility]) 2000's - ~ 04/2015   "I've had a total of 3" (11/08/2016)  . PAF (paroxysmal atrial fibrillation) (HCC)    on Eliquis  . PAF (paroxysmal atrial fibrillation) ([place]) on Eliquis 12/20/2016  . PTSD  (post-traumatic stress disorder)   . Pulmonary embolism ([place])    "I've had 2 or 3" (11/08/2016)  . 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 Squamous cell carcinoma of scalp   . Stroke [facility]) ~ 2005-2006 X 2   denies residual on 11/08/2016    Patient Active Problem List   Diagnosis Date Noted  . PAF (paroxysmal atrial fibrillation) ([place]) on Eliquis 12/20/2016  . Unstable angina ([place]) 12/17/2016  . CAD in native artery   . CAD s/p PCI 11/19/2015  . Anxiety state   . Chest pain, cath with non obstructive CAD 12/20/16 11/18/2015  . HLD (hyperlipidemia) 11/18/2015  . Essential hypertension 11/18/2015  . Tobacco abuse 11/18/2015    Past Surgical History:  Procedure Laterality Date  . ABOVE KNEE LEG AMPUTATION Left   . CARDIAC CATHETERIZATION  "several"  . CARDIAC CATHETERIZATION N/A 11/26/2015   Procedure: Left Heart Cath and Coronary Angiography;  Surgeon: Burnell Blanks, MD;  Location: [place] CV LAB;  Service: Cardiovascular;  Laterality: N/A;  . CLUB FOOT RELEASE Left 02/01/1967  . CORONARY ANGIOPLASTY  "several"  . CORONARY ANGIOPLASTY WITH STENT PLACEMENT  "several"   "total of 10 stents placed" (11/18/2015)  . LAPAROSCOPIC CHOLECYSTECTOMY    . LEFT HEART  CATH AND CORONARY ANGIOGRAPHY N/A 12/20/2016   Procedure: Left Heart Cath and Coronary Angiography;  Surgeon: Troy Sine, MD;  Location: [place] [facility] LAB;  Service: Cardiovascular;  Laterality: N/A;  . ORTHOPEDIC SURGERY  10-30-1967-~ 2007   "total of 52 on my left leg; started w/club foot released"  . TONSILLECTOMY         Home Medications    Prior to Admission medications   Medication Sig Start Date End Date Taking? Authorizing Provider  acetaminophen (TYLENOL) 325 MG tablet Take 2 tablets (650 mg total) by mouth every 4 (four) hours as needed for headache or mild pain. 12/20/16  Yes Isaiah Serge, NP  apixaban (ELIQUIS) 5 MG TABS tablet Take 1 tablet (5 mg total) by mouth 2 (two) times  daily. 12/21/16  Yes Isaiah Serge, NP  aspirin 325 MG EC tablet Take 325 mg by mouth daily.    Yes [provider]  clopidogrel (PLAVIX) 75 MG tablet Take 1 tablet (75 mg total) by mouth daily. 07/22/16  Yes Dessa Phi Chahn-Yang, DO  ibuprofen (ADVIL,MOTRIN) 200 MG tablet Take 200 mg by mouth every 6 (six) hours as needed for headache (pain).    Yes [provider]  loperamide (IMODIUM A-D) 2 MG tablet Take 2-4 mg by mouth 4 (four) times daily as needed for diarrhea or loose stools.   Yes [provider]  metoprolol succinate (TOPROL-XL) 25 MG 24 hr tablet Take 1 tablet (25 mg total) by mouth daily. 10/13/16  Yes Jerline Pain, MD  nitroGLYCERIN (NITROSTAT) 0.4 MG SL tablet Place 1 tablet (0.4 mg total) under the tongue every 5 (five) minutes x 3 doses as needed for chest pain. 12/20/16  Yes Isaiah Serge, NP  oxyCODONE (ROXICODONE) 15 MG immediate release tablet Take 15 mg by mouth every 6 (six) hours. scheduled 04/21/17  Yes [provider]  pantoprazole (PROTONIX) 20 MG tablet Take 1 tablet (20 mg total) by mouth daily. 12/21/16  Yes Isaiah Serge, NP  rosuvastatin (CRESTOR) 5 MG tablet Take 5 mg by mouth at bedtime.    Yes [provider]  ALPRAZolam Duanne Moron) 0.5 MG tablet Take 1 tablet (0.5 mg total) by mouth at bedtime as needed for anxiety. Patient not taking: Reported on 04/02/2017 12/16/16   Mackuen, Fredia Sorrow, MD  cyclobenzaprine (FLEXERIL) 10 MG tablet Take 10 mg by mouth 3 (three) times daily as needed for muscle spasms.  03/10/17   [provider]  LORazepam (ATIVAN) 0.5 MG tablet Take 1 tablet (0.5 mg total) by mouth every 8 (eight) hours as needed for anxiety. Patient not taking: Reported on 04/30/2017 03/06/17   Isla Pence, MD  nicotine (NICODERM CQ - DOSED IN MG/24 HOURS) 21 mg/24hr patch Place 1 patch (21 mg total) onto the skin daily. Patient not taking: Reported on 04/02/2017 12/21/16   Isaiah Serge, NP    Family  History Family History  Problem Relation Age of Onset  . Hypertension Father   . Lung cancer Father   . Heart attack Father     Social History Social History  Substance Use Topics  . Smoking status: Current Some Day Smoker    Packs/day: 0.50    Years: 35.00    Types: Cigarettes  . Smokeless tobacco: Never Used     Comment: 12/17/2016 "using the patch"  . Alcohol use 0.0 oz/week     Comment: 12/17/2016 nothing since "~ 2011"     Allergies   Benadryl [diphenhydramine  hcl]; Promethazine hcl; Augmentin [amoxicillin-pot clavulanate]; and Toradol [ketorolac tromethamine]   Review of Systems Review of Systems  Constitutional: Positive for fatigue.  Respiratory: Positive for chest tightness and shortness of breath.   Cardiovascular: Positive for chest pain.  All other systems reviewed and are negative.    Physical Exam Updated Vital Signs BP 115/80   Pulse 66   Resp 17   SpO2 96%   Physical Exam  Constitutional: He is oriented to person, place, and time. He appears well-developed and well-nourished. No distress.  HENT:  Head: Normocephalic and atraumatic.  Eyes: Conjunctivae are normal.  Neck: Neck supple.  Cardiovascular: Normal rate, regular rhythm and normal heart sounds.  Exam reveals no friction rub.   No murmur heard. Pulmonary/Chest: Effort normal and breath sounds normal. No respiratory distress. He has no wheezes. He has no rales.  Abdominal: Soft. He exhibits no distension.  Musculoskeletal: He exhibits no edema.  Neurological: He is alert and oriented to person, place, and time. He exhibits normal muscle tone.  Skin: Skin is warm. Capillary refill takes less than 2 seconds.  Psychiatric: He has a normal mood and affect.  Nursing note and vitals reviewed.    ED Treatments / Results  Labs (all labs ordered are listed, but only abnormal results are displayed) Labs Reviewed  BASIC METABOLIC PANEL - Abnormal; Notable for the following:       Result Value    Glucose, Bld 111 (*)    BUN 5 (*)    All other components within normal limits  HEPATIC FUNCTION PANEL - Abnormal; Notable for the following:    ALT 10 (*)    Alkaline Phosphatase 129 (*)    Bilirubin, Direct <0.1 (*)    All other components within normal limits  CBC  BRAIN NATRIURETIC PEPTIDE  I-STAT TROPOININ, ED  I-STAT TROPOININ, ED    EKG  EKG Interpretation  Date/Time:  Saturday April 30 2017 16:48:51 EDT Ventricular Rate:  99 PR Interval:  134 QRS Duration: 84 QT Interval:  330 QTC Calculation: 423 R Axis:   61 Text Interpretation:  Normal sinus rhythm Nonspecific ST abnormality Abnormal ECG No significant change since last tracing Confirmed by Duffy Bruce [phone]) on 04/30/2017 5:22:45 PM       Radiology Dg Chest 2 View  Result Date: 04/30/2017 CLINICAL DATA:  51 year old male with chest pain. EXAM: CHEST  2 VIEW COMPARISON:  Chest radiograph dated 04/02/2017 FINDINGS: The lungs are clear. There is no pleural effusion or pneumothorax. The cardiac silhouette is within normal limits. Coronary vascular stent noted. No acute osseous pathology. IMPRESSION: No active cardiopulmonary disease. Electronically Signed   By: Anner Crete M.D.   On: 04/30/2017 18:23    Procedures Procedures (including critical care time)  Medications Ordered in ED Medications  nitroGLYCERIN (NITROGLYN) 2 % ointment 1 inch (1 inch Topical Given 04/30/17 1753)  aspirin chewable tablet 324 mg (324 mg Oral Given 04/30/17 1753)  morphine 4 MG/ML injection 4 mg (4 mg Intravenous Given 04/30/17 1929)  gi cocktail (Maalox,Lidocaine,Donnatal) (30 mLs Oral Given 04/30/17 2009)  oxyCODONE-acetaminophen (PERCOCET/ROXICET) 5-325 MG per tablet 2 tablet (2 tablets Oral Given 04/30/17 2009)     Initial Impression / Assessment and Plan / ED Course  I have reviewed the triage vital signs and the nursing notes.  Pertinent labs & imaging results that were available during my care of the patient were  reviewed by me and considered in my medical decision making (see chart for details).  51 yo M with PMHx of CAD, h/o chronic recurrent non-cardiac CP, s/p clean cath 12/2016, here with ongoing chest pain. Exam as above. Etiology of pt's recurrent CP is unclear at this time - likely non-cardiac versus stable angina, also anxiety, GERD pain. He has a normal EKG today with neg trop x 2 - discussed with Cardiology Dr. Otelia Sergeant who reviewed labs/imaging, and does not feel further work-up indicated from cardiac standpoint. He has no tachycardia, tachypnea, cough, SOB, and I do not suspect PE (has had multiple neg PE work-ups in the past). BNP normal. He does have some mild improvement with nitro but this is typical of his chronic CP. No sx/labs to suggest Gi etiology but esophageal spasm on DDx as well.  Pt feels improved in ED and is requesting to go home. Feel this is reasonable. Reiterated importance of outpt follow-up. Smoking cessation instruction/counseling given:  counseled patient on the dangers of tobacco use, advised patient to stop smoking, and reviewed strategies to maximize success.   Final Clinical Impressions(s) / ED Diagnoses   Final diagnoses:  Tobacco dependence  Atypical chest pain    New Prescriptions Discharge Medication List as of 04/30/2017  8:59 PM       Duffy Bruce, MD 04/30/17 2324

## 2017-05-01 ENCOUNTER — Emergency Department (HOSPITAL_COMMUNITY)
Admission: EM | Admit: 2017-05-01 | Discharge: 2017-05-01 | Disposition: A | Discharge status: Home / Self Care | Payer: Medicare HMO | Attending: Emergency Medicine | Admitting: Emergency Medicine

## 2017-05-01 ENCOUNTER — Encounter (HOSPITAL_COMMUNITY): Payer: Self-pay

## 2017-05-01 ENCOUNTER — Telehealth: Payer: Self-pay | Admitting: Physician Assistant

## 2017-05-01 DIAGNOSIS — Z8673 Personal history of transient ischemic attack (TIA), and cerebral infarction without residual deficits: Secondary | ICD-10-CM | POA: Insufficient documentation

## 2017-05-01 DIAGNOSIS — Z79899 Other long term (current) drug therapy: Secondary | ICD-10-CM | POA: Insufficient documentation

## 2017-05-01 DIAGNOSIS — Z7902 Long term (current) use of antithrombotics/antiplatelets: Secondary | ICD-10-CM | POA: Diagnosis not present

## 2017-05-01 DIAGNOSIS — Z880 Allergy status to penicillin: Secondary | ICD-10-CM | POA: Diagnosis not present

## 2017-05-01 DIAGNOSIS — I119 Hypertensive heart disease without heart failure: Secondary | ICD-10-CM | POA: Diagnosis not present

## 2017-05-01 DIAGNOSIS — I2510 Atherosclerotic heart disease of native coronary artery without angina pectoris: Secondary | ICD-10-CM | POA: Insufficient documentation

## 2017-05-01 DIAGNOSIS — Z7982 Long term (current) use of aspirin: Secondary | ICD-10-CM | POA: Diagnosis not present

## 2017-05-01 DIAGNOSIS — I252 Old myocardial infarction: Secondary | ICD-10-CM | POA: Diagnosis not present

## 2017-05-01 DIAGNOSIS — R079 Chest pain, unspecified: Secondary | ICD-10-CM | POA: Diagnosis not present

## 2017-05-01 DIAGNOSIS — J449 Chronic obstructive pulmonary disease, unspecified: Secondary | ICD-10-CM | POA: Insufficient documentation

## 2017-05-01 DIAGNOSIS — F17210 Nicotine dependence, cigarettes, uncomplicated: Secondary | ICD-10-CM | POA: Insufficient documentation

## 2017-05-01 LAB — CBC
HCT: 39.2 % (ref 39.0–52.0)
Hemoglobin: 13.8 g/dL (ref 13.0–17.0)
MCH: 33.2 pg (ref 26.0–34.0)
MCHC: 35.2 g/dL (ref 30.0–36.0)
MCV: 94.2 fL (ref 78.0–100.0)
PLATELETS: 250 10*3/uL (ref 150–400)
RBC: 4.16 MIL/uL — AB (ref 4.22–5.81)
RDW: 13.9 % (ref 11.5–15.5)
WBC: 7.8 10*3/uL (ref 4.0–10.5)

## 2017-05-01 LAB — BASIC METABOLIC PANEL
Anion gap: 8 (ref 5–15)
BUN: 5 mg/dL — AB (ref 6–20)
CHLORIDE: 105 mmol/L (ref 101–111)
CO2: 25 mmol/L (ref 22–32)
CREATININE: 1.01 mg/dL (ref 0.61–1.24)
Calcium: 9.3 mg/dL (ref 8.9–10.3)
Glucose, Bld: 92 mg/dL (ref 65–99)
Potassium: 3.9 mmol/L (ref 3.5–5.1)
SODIUM: 138 mmol/L (ref 135–145)

## 2017-05-01 LAB — I-STAT TROPONIN, ED: TROPONIN I, POC: 0.05 ng/mL (ref 0.00–0.08)

## 2017-05-01 LAB — D-DIMER, QUANTITATIVE: D-Dimer, Quant: 0.58 ug/mL-FEU — ABNORMAL HIGH (ref 0.00–0.50)

## 2017-05-01 MED ORDER — NITROGLYCERIN 0.4 MG SL SUBL
0.4000 mg | SUBLINGUAL_TABLET | SUBLINGUAL | Status: AC | PRN
  Administered 2017-05-01 (×3): 0.4 mg via SUBLINGUAL
  Filled 2017-05-01: qty 1

## 2017-05-01 MED ORDER — ASPIRIN 81 MG PO CHEW
324.0000 mg | CHEWABLE_TABLET | Freq: Once | ORAL | Status: AC
  Administered 2017-05-01: 324 mg via ORAL
  Filled 2017-05-01: qty 4

## 2017-05-01 MED ORDER — NITROGLYCERIN 2 % TD OINT
1.0000 [in_us] | TOPICAL_OINTMENT | Freq: Once | TRANSDERMAL | Status: AC
  Administered 2017-05-01: 1 [in_us] via TOPICAL
  Filled 2017-05-01: qty 1

## 2017-05-01 NOTE — ED Triage Notes (Signed)
Patient complains of ongoing chest pain and seen yesterday in ED for same. Reports that his ongoing CP is not resolving and here for further evaluation. Wants to be stuck only once and did not want labs collected at triage. Request only to be stuck by IV therapy.

## 2017-05-01 NOTE — ED Notes (Signed)
IV team at bedside 

## 2017-05-01 NOTE — Telephone Encounter (Signed)
The patient called the answering service after-hours today. Chart reviewed, CAD s/p prior stenting, HTN, HL, prior stroke, prior IV drug abuse and endocarditis in 2016, chronic low back pain (takes opioids), anxiety and reported atrial fibrillation (although this has not been documented). 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 Cath 12/2016 was stable except for jailing of small branch of marginal with recommendation for medical therapy. Prior evals for PE noted as well. He reports remote h/o GI eval but none recently. Went to ED yesterday for severe chest pain and SOB, troponins negative, had been discussed with on-call cardiologist. Patient was advised to return if symptoms returned or worsened - this afternoon he reports recurrence of chest pain and severe dyspnea and wants to know if appropriate to return to ED. Unfortunately it is impossible to fully eval/reassure over the phone - etiology of his chest pain not immediately known to me, but we discussed presenting back to the ED to exclude development of new ischemia or other acute issue. Office is not open today so this is our only option based on how severe he is reporting his symptoms are. May benefit from consideration of empiric antianginals to address possibility of small vessel disease, eval for recurrent valve issue, or alternative workup for noncardiac causes of chest pain including MSK, anxiety, GERD, or COPD as he had prior emphysema noted on CTA. Also has h/o chronic pain so suspect pain tolerance in general is fairly low. May benefit from pain management referral/resources if no evidence to suspect any of this is recurrently cardiac. Initially I was concerned that patient may be pain seeking but he did not specifically ask me for any pain medications during our conversation, just sounding really concerned for what he should do next. The patient verbalized understanding and gratitude for above  discussion. Aaron Farney PA-C

## 2017-05-01 NOTE — ED Provider Notes (Signed)
[facility] DEPT Provider Note   CSN: [id number] Arrival date & time: 05/01/17  1556     History   Chief Complaint Chief Complaint  Patient presents with  . Chest Pain    HPI Aaron Dodson is a 51 y.o. male.  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 He was evaluated for CP yesterday and after discharge pain eased off. This morning pain returned and woke him from sleep. The pain is left sided, pressure sensation, sometimes radiating to back and left shoulder. Pain is constant, nothing makes it worse or better. He took NTG which mildly eased it off. He reports associated decreased appetite, shortness of breath but no nausea or sweatiness. No fever, cough/cold, vomiting, diarrhea. No IVDU x 2 years.  He called [facility] and was instructed to return to ED.    The history is provided by the patient.  Chest Pain      Past Medical History:  Diagnosis Date  . ADD (attention deficit disorder)   . Anxiety   . Chest pain, cath with non obstructive CAD 12/20/16 11/18/2015  . Chronic lower back pain   . Club foot   . COPD (chronic obstructive pulmonary disease) ([place])   . Coronary artery disease    a. s/p multiple prior PCIs (LAD, LCx, OM1 - [place]; [place]) // b. LHC 1/17: LM 30, mLAD 20, dLAD stent patent, oLCx 40 within stent but o/w patent with 50-70 beyond stent (jailed), OM1 stent patent, oRCA stent patent with 20 ISR, EF 55-65 // c. Echo 1/17: EF 50-55, no RWMA, Gr 1 DD, mild AI  . Endocarditis    hx of IV drug abuse; tx At [facility] 2016  . GERD (gastroesophageal reflux disease)   . History of echocardiogram    Echo 11/09/16: mild LVH, EF 55-60, no RWMA, Gr 1 DD, mildly dilated aortic root (38 mm)  . Hyperlipidemia   . Hypertension   . Myocardial infarction [facility]) 2000's - ~ 04/2015   "I've had a total of 3" (11/08/2016)  . PAF (paroxysmal atrial fibrillation) (HCC)    on Eliquis  . PAF  (paroxysmal atrial fibrillation) ([place]) on Eliquis 12/20/2016  . PTSD (post-traumatic stress disorder)   . Pulmonary embolism ([place])    "I've had 2 or 3" (11/08/2016)  . 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 Squamous cell carcinoma of scalp   . Stroke [facility]) ~ 2005-2006 X 2   denies residual on 11/08/2016    Patient Active Problem List   Diagnosis Date Noted  . PAF (paroxysmal atrial fibrillation) ([place]) on Eliquis 12/20/2016  . Unstable angina ([place]) 12/17/2016  . CAD in native artery   . CAD s/p PCI 11/19/2015  . Anxiety state   . Chest pain, cath with non obstructive CAD 12/20/16 11/18/2015  . HLD (hyperlipidemia) 11/18/2015  . Essential hypertension 11/18/2015  . Tobacco abuse 11/18/2015    Past Surgical History:  Procedure Laterality Date  . ABOVE KNEE LEG AMPUTATION Left   . CARDIAC CATHETERIZATION  "several"  . CARDIAC CATHETERIZATION N/A 11/26/2015   Procedure: Left Heart Cath and Coronary Angiography;  Surgeon: Burnell Blanks, MD;  Location: [place] CV LAB;  Service: Cardiovascular;  Laterality: N/A;  . CLUB FOOT RELEASE Left 1967-09-13  . CORONARY ANGIOPLASTY  "several"  . CORONARY ANGIOPLASTY WITH STENT PLACEMENT  "several"   "total of 10 stents placed" (  11/18/2015)  . LAPAROSCOPIC CHOLECYSTECTOMY    . LEFT HEART CATH AND CORONARY ANGIOGRAPHY N/A 12/20/2016   Procedure: Left Heart Cath and Coronary Angiography;  Surgeon: Troy Sine, MD;  Location: [place] CV LAB;  Service: Cardiovascular;  Laterality: N/A;  . ORTHOPEDIC SURGERY  10/13/67-~ 2007   "total of 52 on my left leg; started w/club foot released"  . TONSILLECTOMY         Home Medications    Prior to Admission medications   Medication Sig Start Date End Date Taking? Authorizing Provider  acetaminophen (TYLENOL) 325 MG tablet Take 2 tablets (650 mg total) by mouth every 4 (four) hours as needed for headache or mild pain. 12/20/16  Yes Isaiah Serge, NP  apixaban (ELIQUIS) 5  MG TABS tablet Take 1 tablet (5 mg total) by mouth 2 (two) times daily. 12/21/16  Yes Isaiah Serge, NP  aspirin 325 MG EC tablet Take 325 mg by mouth daily.    Yes [provider]  clopidogrel (PLAVIX) 75 MG tablet Take 1 tablet (75 mg total) by mouth daily. 07/22/16  Yes Dessa Phi Chahn-Yang, DO  cyclobenzaprine (FLEXERIL) 10 MG tablet Take 10 mg by mouth 3 (three) times daily as needed for muscle spasms.  03/10/17  Yes [provider]  ibuprofen (ADVIL,MOTRIN) 200 MG tablet Take 200 mg by mouth every 6 (six) hours as needed for headache (pain).    Yes [provider]  loperamide (IMODIUM A-D) 2 MG tablet Take 2-4 mg by mouth 4 (four) times daily as needed for diarrhea or loose stools.   Yes [provider]  metoprolol succinate (TOPROL-XL) 25 MG 24 hr tablet Take 1 tablet (25 mg total) by mouth daily. 10/13/16  Yes Jerline Pain, MD  nitroGLYCERIN (NITROSTAT) 0.4 MG SL tablet Place 1 tablet (0.4 mg total) under the tongue every 5 (five) minutes x 3 doses as needed for chest pain. 12/20/16  Yes Isaiah Serge, NP  oxyCODONE (ROXICODONE) 15 MG immediate release tablet Take 15 mg by mouth every 6 (six) hours. scheduled 04/21/17  Yes [provider]  pantoprazole (PROTONIX) 20 MG tablet Take 1 tablet (20 mg total) by mouth daily. 12/21/16  Yes Isaiah Serge, NP  rosuvastatin (CRESTOR) 5 MG tablet Take 5 mg by mouth at bedtime.    Yes [provider]  LORazepam (ATIVAN) 0.5 MG tablet Take 1 tablet (0.5 mg total) by mouth every 8 (eight) hours as needed for anxiety. Patient not taking: Reported on 04/30/2017 03/06/17   Isla Pence, MD  nicotine (NICODERM CQ - DOSED IN MG/24 HOURS) 21 mg/24hr patch Place 1 patch (21 mg total) onto the skin daily. Patient not taking: Reported on 04/02/2017 12/21/16   Isaiah Serge, NP    Family History Family History  Problem Relation Age of Onset  . Hypertension Father   . Lung cancer Father   . Heart  attack Father     Social History Social History  Substance Use Topics  . Smoking status: Current Some Day Smoker    Packs/day: 0.50    Years: 35.00    Types: Cigarettes  . Smokeless tobacco: Never Used     Comment: 12/17/2016 "using the patch"  . Alcohol use 0.0 oz/week     Comment: 12/17/2016 nothing since "~ 2011"     Allergies   Benadryl [diphenhydramine hcl]; Promethazine hcl; Augmentin [amoxicillin-pot clavulanate]; and Toradol [ketorolac tromethamine]   Review of Systems Review of Systems  Cardiovascular: Positive for  chest pain.  All other systems reviewed and are negative except that which was mentioned in HPI    Physical Exam Updated Vital Signs BP (!) 145/95   Pulse 68   Temp 98.4 F (36.9 C) (Oral)   Resp 13   SpO2 98%   Physical Exam  Constitutional: He is oriented to person, place, and time. He appears well-developed and well-nourished. No distress.  HENT:  Head: Normocephalic and atraumatic.  Moist mucous membranes  Eyes: Conjunctivae are normal. Pupils are equal, round, and reactive to light.  Neck: Neck supple.  Cardiovascular: Normal rate, regular rhythm and normal heart sounds.   Pulmonary/Chest: Effort normal and breath sounds normal.  Abdominal: Soft. Bowel sounds are normal. He exhibits no distension. There is no tenderness.  Musculoskeletal: He exhibits no edema.  Neurological: He is alert and oriented to person, place, and time.  Fluent speech  Skin: Skin is warm and dry.  Psychiatric: Judgment normal.  Mildly anxious  Nursing note and vitals reviewed.    ED Treatments / Results  Labs (all labs ordered are listed, but only abnormal results are displayed) Labs Reviewed  BASIC METABOLIC PANEL - Abnormal; Notable for the following:       Result Value   BUN 5 (*)    All other components within normal limits  CBC - Abnormal; Notable for the following:    RBC 4.16 (*)    All other components within normal limits  D-DIMER, QUANTITATIVE  (NOT AT [facility]) - Abnormal; Notable for the following:    D-Dimer, Quant 0.58 (*)    All other components within normal limits  I-STAT TROPOININ, ED    EKG  EKG Interpretation  Date/Time:  Sunday May 01 2017 15:58:21 EDT Ventricular Rate:  103 PR Interval:  136 QRS Duration: 84 QT Interval:  336 QTC Calculation: 440 R Axis:   57 Text Interpretation:  Sinus tachycardia Otherwise normal ECG No significant change since last tracing Confirmed by Theotis Burrow [phone]) on 05/01/2017 4:24:01 PM       Radiology Dg Chest 2 View  Result Date: 04/30/2017 CLINICAL DATA:  51 year old male with chest pain. EXAM: CHEST  2 VIEW COMPARISON:  Chest radiograph dated 04/02/2017 FINDINGS: The lungs are clear. There is no pleural effusion or pneumothorax. The cardiac silhouette is within normal limits. Coronary vascular stent noted. No acute osseous pathology. IMPRESSION: No active cardiopulmonary disease. Electronically Signed   By: Anner Crete M.D.   On: 04/30/2017 18:23    Procedures Procedures (including critical care time)  Medications Ordered in ED Medications  aspirin chewable tablet 324 mg (324 mg Oral Given 05/01/17 1746)  nitroGLYCERIN (NITROSTAT) SL tablet 0.4 mg (0.4 mg Sublingual Given 05/01/17 1800)  nitroGLYCERIN (NITROGLYN) 2 % ointment 1 inch (1 inch Topical Given 05/01/17 2016)     Initial Impression / Assessment and Plan / ED Course  I have reviewed the triage vital signs and the nursing notes.  Pertinent labs & imaging results that were available during my care of the patient were reviewed by me and considered in my medical decision making (see chart for details).    PT p/w ongoing chest pain, Was evaluated here yesterday and discharged after negative serial troponins, back today because his symptoms have reoccurred. He was nontoxic on exam, vital signs notable for mild hypertension. He was borderline tachycardic at triage that normal heart rate during my examination.  Normal work of breathing and clear breath sounds. He states that his symptoms do improve with nitroglycerin,  gave the patient sublingual and later Nitropaste. His chest x-ray yesterday was unremarkable. Labs today show unremarkable CBC/BMP and negative troponin. I obtained a d-dimer which was very mildly elevated at 0.58 w/ age adjusted cut off 0.5. The patient required multiple sticks to obtain blood work and refused any further attempts for the IV. I had a long discussion with him regarding next step in workup for PE which would require IV for CT angiogram. Overall my suspicion is low as his PE was in the context of IV drug use and he has had no unprovoked clots, no recent risk factors for blood clot. His d-dimer is also barely above normal. I did explain that not obtaining this study does run small risk of missing a blood clot. The patient voiced understanding of risks of not obtaining this study and ultimately decided that he did not want to undergo any further IV attempts to obtain CTA. I discussed his presentation with cardiology, Dr. Eula Fried, who advised that the patient will likely have chronic chest pain because of his underlying multivessel disease but given no active ischemia or changes on EKG he does not require any further cardiac evaluation or management at this time. He strongly recommended that he follow-up with his cardiologist in the clinic. Patient has voiced understanding and plans to contact for appointment. Patient discharged in satisfactory condition. Final Clinical Impressions(s) / ED Diagnoses   Final diagnoses:  Chest pain, unspecified type    New Prescriptions Discharge Medication List as of 05/01/2017  9:09 PM       Shlok Raz, Wenda Overland, MD 05/02/17 [phone]

## 2017-05-01 NOTE — ED Notes (Signed)
ED Provider at bedside. 

## 2017-05-01 NOTE — ED Notes (Signed)
Pt reports he only wants to have one IV stick stating he is former IV drug user and has no veins. Pt requesting IV team. EDP made aware and agrees. Explained to pt this would delay blood results and he agrees. Will place IV team consult.

## 2017-05-05 ENCOUNTER — Encounter (HOSPITAL_COMMUNITY): Payer: Self-pay | Admitting: Emergency Medicine

## 2017-05-05 ENCOUNTER — Emergency Department (HOSPITAL_COMMUNITY)
Admission: EM | Admit: 2017-05-05 | Discharge: 2017-05-06 | Discharge status: Against Medical Advice | Payer: Medicare HMO | Attending: Emergency Medicine | Admitting: Emergency Medicine

## 2017-05-05 ENCOUNTER — Emergency Department (HOSPITAL_COMMUNITY): Payer: Medicare HMO

## 2017-05-05 DIAGNOSIS — Z955 Presence of coronary angioplasty implant and graft: Secondary | ICD-10-CM | POA: Diagnosis not present

## 2017-05-05 DIAGNOSIS — I10 Essential (primary) hypertension: Secondary | ICD-10-CM | POA: Insufficient documentation

## 2017-05-05 DIAGNOSIS — Z79899 Other long term (current) drug therapy: Secondary | ICD-10-CM | POA: Diagnosis not present

## 2017-05-05 DIAGNOSIS — J449 Chronic obstructive pulmonary disease, unspecified: Secondary | ICD-10-CM | POA: Diagnosis not present

## 2017-05-05 DIAGNOSIS — Z7901 Long term (current) use of anticoagulants: Secondary | ICD-10-CM | POA: Insufficient documentation

## 2017-05-05 DIAGNOSIS — F909 Attention-deficit hyperactivity disorder, unspecified type: Secondary | ICD-10-CM | POA: Diagnosis not present

## 2017-05-05 DIAGNOSIS — F17210 Nicotine dependence, cigarettes, uncomplicated: Secondary | ICD-10-CM | POA: Insufficient documentation

## 2017-05-05 DIAGNOSIS — I252 Old myocardial infarction: Secondary | ICD-10-CM | POA: Diagnosis not present

## 2017-05-05 DIAGNOSIS — I2510 Atherosclerotic heart disease of native coronary artery without angina pectoris: Secondary | ICD-10-CM | POA: Insufficient documentation

## 2017-05-05 DIAGNOSIS — Z8673 Personal history of transient ischemic attack (TIA), and cerebral infarction without residual deficits: Secondary | ICD-10-CM | POA: Diagnosis not present

## 2017-05-05 DIAGNOSIS — R0789 Other chest pain: Secondary | ICD-10-CM | POA: Diagnosis not present

## 2017-05-05 LAB — BASIC METABOLIC PANEL
ANION GAP: 9 (ref 5–15)
BUN: 8 mg/dL (ref 6–20)
CO2: 25 mmol/L (ref 22–32)
Calcium: 9.2 mg/dL (ref 8.9–10.3)
Chloride: 103 mmol/L (ref 101–111)
Creatinine, Ser: 1.05 mg/dL (ref 0.61–1.24)
GFR calc Af Amer: >60 mL/min (ref >60)
GFR calc non Af Amer: >60 mL/min (ref >60)
GLUCOSE: 109 mg/dL — AB (ref 65–99)
POTASSIUM: 3.1 mmol/L — AB (ref 3.5–5.1)
Sodium: 137 mmol/L (ref 135–145)

## 2017-05-05 LAB — CBC
HEMATOCRIT: 35.9 % — AB (ref 39.0–52.0)
HEMOGLOBIN: 12.8 g/dL — AB (ref 13.0–17.0)
MCH: 32.8 pg (ref 26.0–34.0)
MCHC: 35.7 g/dL (ref 30.0–36.0)
MCV: 92.1 fL (ref 78.0–100.0)
Platelets: 282 10*3/uL (ref 150–400)
RBC: 3.9 MIL/uL — ABNORMAL LOW (ref 4.22–5.81)
RDW: 14 % (ref 11.5–15.5)
WBC: 8.7 10*3/uL (ref 4.0–10.5)

## 2017-05-05 LAB — POCT I-STAT TROPONIN I: Troponin i, poc: 0.01 ng/mL (ref 0.00–0.08)

## 2017-05-05 MED ORDER — ASPIRIN 81 MG PO CHEW
324.0000 mg | CHEWABLE_TABLET | Freq: Once | ORAL | Status: AC
Start: 2017-05-05 — End: 2017-05-05
  Administered 2017-05-05: 324 mg via ORAL
  Filled 2017-05-05: qty 4

## 2017-05-05 MED ORDER — NITROGLYCERIN 2 % TD OINT
1.0000 [in_us] | TOPICAL_OINTMENT | Freq: Once | TRANSDERMAL | Status: AC
  Administered 2017-05-05: 1 [in_us] via TOPICAL
  Filled 2017-05-05: qty 1

## 2017-05-05 NOTE — Telephone Encounter (Signed)
Thanks Mark Skains, MD  

## 2017-05-05 NOTE — ED Notes (Signed)
Pt requesting oxycodone 15mg  for chronic pain per home regimen. Primary RN. Roderic Palau made aware at this time.

## 2017-05-05 NOTE — ED Triage Notes (Signed)
Pt is c/o pain in his left chest that started today around 4 pm  Pt states it is a sharp pressure  Pt states he was seen Sunday at [facility] for chest pain but this is more intense  Pt has hx of 3 heart attacks and 2 strokes and has 10 stents  Pt has an appt with his cardiologist July 9th

## 2017-05-06 ENCOUNTER — Telehealth: Payer: Self-pay | Admitting: Cardiology

## 2017-05-06 ENCOUNTER — Telehealth: Payer: Self-pay | Admitting: Physician Assistant

## 2017-05-06 ENCOUNTER — Encounter: Payer: Self-pay | Admitting: Physician Assistant

## 2017-05-06 ENCOUNTER — Ambulatory Visit (INDEPENDENT_AMBULATORY_CARE_PROVIDER_SITE_OTHER): Payer: Medicare HMO | Admitting: Physician Assistant

## 2017-05-06 VITALS — BP 136/80 | HR 85 | Ht 72.0 in | Wt 192.4 lb

## 2017-05-06 DIAGNOSIS — R55 Syncope and collapse: Secondary | ICD-10-CM | POA: Insufficient documentation

## 2017-05-06 DIAGNOSIS — I480 Paroxysmal atrial fibrillation: Secondary | ICD-10-CM

## 2017-05-06 DIAGNOSIS — E876 Hypokalemia: Secondary | ICD-10-CM

## 2017-05-06 DIAGNOSIS — R7881 Bacteremia: Secondary | ICD-10-CM | POA: Insufficient documentation

## 2017-05-06 DIAGNOSIS — I358 Other nonrheumatic aortic valve disorders: Secondary | ICD-10-CM | POA: Insufficient documentation

## 2017-05-06 DIAGNOSIS — I2510 Atherosclerotic heart disease of native coronary artery without angina pectoris: Secondary | ICD-10-CM | POA: Diagnosis not present

## 2017-05-06 DIAGNOSIS — R0789 Other chest pain: Secondary | ICD-10-CM

## 2017-05-06 DIAGNOSIS — F419 Anxiety disorder, unspecified: Secondary | ICD-10-CM

## 2017-05-06 DIAGNOSIS — G459 Transient cerebral ischemic attack, unspecified: Secondary | ICD-10-CM | POA: Insufficient documentation

## 2017-05-06 LAB — POCT I-STAT TROPONIN I: TROPONIN I, POC: 0 ng/mL (ref 0.00–0.08)

## 2017-05-06 MED ORDER — AMLODIPINE BESYLATE 2.5 MG PO TABS: 2.5000 mg | ORAL_TABLET | Freq: Every day | ORAL | 2 refills | Status: DC

## 2017-05-06 MED ORDER — PANTOPRAZOLE SODIUM 40 MG PO TBEC: 40.0000 mg | DELAYED_RELEASE_TABLET | Freq: Every day | ORAL | 2 refills | Status: DC

## 2017-05-06 NOTE — ED Provider Notes (Signed)
[facility] DEPT Provider Note   CSN: [id number] Arrival date & time: 05/05/17  2045  Time seen 23:08 PM   History   Chief Complaint Chief Complaint  Patient presents with  . Chest Pain    HPI Aaron Dodson is a 51 y.o. male.  HPI  patient has a history coronary artery disease status post coronary stents, relates about 4 PM he started having pain in his left mid chest after bringing the garbage cans back to the house. He states he has some tingling in his left hand and feels like his left hand is swollen. He describes the pain as pressure. He states he was already sweating when he came in from the outside due to the heat. He denies nausea, vomiting. He states he does feel short of breath. He states the pain is lasting 5-10 minutes and he has been having intermittent episodes. He states he's had about 10. He states nothing he does makes them, or feel worse, nothing he does makes it feel worse. He states his last episode was about 11 PM. He relates he's been getting more frequent episodes of chest pain, stating he is getting them once or twice a week. He states he had a cardiac cath done 3-4 months ago that did not show any significant blockage. He states he took nitroglycerin 3 at home without relief. He states he last took aspirin 325 mg this morning.  PCP none Cardiology Dr Marlou Porch  Past Medical History:  Diagnosis Date  . ADD (attention deficit disorder)   . Anxiety   . Chest pain, cath with non obstructive CAD 12/20/16 11/18/2015  . Chronic lower back pain   . Club foot   . COPD (chronic obstructive pulmonary disease) ([place])   . Coronary artery disease    a. s/p multiple prior PCIs (LAD, LCx, OM1 - [place]; [place]) // b. LHC 1/17: LM 30, mLAD 20, dLAD stent patent, oLCx 40 within stent but o/w patent with 50-70 beyond stent (jailed), OM1 stent patent, oRCA stent patent with 20 ISR, EF 55-65 // c. Echo 1/17: EF 50-55, no RWMA, Gr 1 DD, mild AI  . Endocarditis    hx of IV drug  abuse; tx At [facility] 2016  . GERD (gastroesophageal reflux disease)   . History of echocardiogram    Echo 11/09/16: mild LVH, EF 55-60, no RWMA, Gr 1 DD, mildly dilated aortic root (38 mm)  . Hyperlipidemia   . Hypertension   . Myocardial infarction [facility]) 2000's - ~ 04/2015   "I've had a total of 3" (11/08/2016)  . PAF (paroxysmal atrial fibrillation) (HCC)    on Eliquis  . PAF (paroxysmal atrial fibrillation) ([place]) on Eliquis 12/20/2016  . PTSD (post-traumatic stress disorder)   . Pulmonary embolism ([place])    "I've had 2 or 3" (11/08/2016)  . 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 Squamous cell carcinoma of scalp   . Stroke [facility]) ~ 2005-2006 X 2   denies residual on 11/08/2016    Patient Active Problem List   Diagnosis Date Noted  . PAF (paroxysmal atrial fibrillation) ([place]) on Eliquis 12/20/2016  . Unstable angina ([place]) 12/17/2016  . CAD in native artery   . CAD s/p PCI 11/19/2015  . Anxiety state   . Chest pain, cath with non obstructive CAD 12/20/16 11/18/2015  . HLD (hyperlipidemia) 11/18/2015  . Essential hypertension 11/18/2015  . Tobacco abuse 11/18/2015    Past Surgical History:  Procedure Laterality Date  .  ABOVE KNEE LEG AMPUTATION Left   . CARDIAC CATHETERIZATION  "several"  . CARDIAC CATHETERIZATION N/A 11/26/2015   Procedure: Left Heart Cath and Coronary Angiography;  Surgeon: Burnell Blanks, MD;  Location: [facility];  Service: Cardiovascular;  Laterality: N/A;  . CLUB FOOT RELEASE Left 07/26/1967  . CORONARY ANGIOPLASTY  "several"  . CORONARY ANGIOPLASTY WITH STENT PLACEMENT  "several"   "total of 10 stents placed" (11/18/2015)  . LAPAROSCOPIC CHOLECYSTECTOMY    . LEFT HEART CATH AND CORONARY ANGIOGRAPHY N/A 12/20/2016   Procedure: Left Heart Cath and Coronary Angiography;  Surgeon: Troy Sine, MD;  Location: [place] [place];  Service: Cardiovascular;  Laterality: N/A;  . ORTHOPEDIC SURGERY  1967/04/15-~ 2007   "total of 52 on  my left leg; started w/club foot released"  . TONSILLECTOMY         Home Medications    Prior to Admission medications   Medication Sig Start Date End Date Taking? Authorizing Provider  acetaminophen (TYLENOL) 325 MG tablet Take 2 tablets (650 mg total) by mouth every 4 (four) hours as needed for headache or mild pain. 12/20/16  Yes Isaiah Serge, NP  apixaban (ELIQUIS) 5 MG TABS tablet Take 1 tablet (5 mg total) by mouth 2 (two) times daily. 12/21/16  Yes Isaiah Serge, NP  aspirin 325 MG EC tablet Take 325 mg by mouth daily.    Yes [provider]  clopidogrel (PLAVIX) 75 MG tablet Take 1 tablet (75 mg total) by mouth daily. 07/22/16  Yes Dessa Phi Chahn-Yang, DO  cyclobenzaprine (FLEXERIL) 10 MG tablet Take 10 mg by mouth 3 (three) times daily as needed for muscle spasms.  03/10/17  Yes [provider]  ibuprofen (ADVIL,MOTRIN) 200 MG tablet Take 200 mg by mouth every 6 (six) hours as needed for headache (pain).    Yes [provider]  loperamide (IMODIUM A-D) 2 MG tablet Take 2-4 mg by mouth 4 (four) times daily as needed for diarrhea or loose stools.   Yes [provider]  metoprolol succinate (TOPROL-XL) 25 MG 24 hr tablet Take 1 tablet (25 mg total) by mouth daily. 10/13/16  Yes Jerline Pain, MD  nitroGLYCERIN (NITROSTAT) 0.4 MG SL tablet Place 1 tablet (0.4 mg total) under the tongue every 5 (five) minutes x 3 doses as needed for chest pain. 12/20/16  Yes Isaiah Serge, NP  oxyCODONE (ROXICODONE) 15 MG immediate release tablet Take 15 mg by mouth every 6 (six) hours. scheduled 04/21/17  Yes [provider]  pantoprazole (PROTONIX) 20 MG tablet Take 1 tablet (20 mg total) by mouth daily. 12/21/16  Yes Isaiah Serge, NP  rosuvastatin (CRESTOR) 5 MG tablet Take 5 mg by mouth at bedtime.    Yes [provider]  LORazepam (ATIVAN) 0.5 MG tablet Take 1 tablet (0.5 mg total) by mouth every 8 (eight) hours as needed for  anxiety. Patient not taking: Reported on 04/30/2017 03/06/17   Isla Pence, MD  nicotine (NICODERM CQ - DOSED IN MG/24 HOURS) 21 mg/24hr patch Place 1 patch (21 mg total) onto the skin daily. Patient not taking: Reported on 04/02/2017 12/21/16   Isaiah Serge, NP    Family History Family History  Problem Relation Age of Onset  . Hypertension Father   . Lung cancer Father   . Heart attack Father     Social History Social History  Substance Use Topics  . Smoking status: Current Some Day Smoker  Packs/day: 0.50    Years: 35.00    Types: Cigarettes  . Smokeless tobacco: Never Used     Comment: 12/17/2016 "using the patch"  . Alcohol use 0.0 oz/week     Comment: 12/17/2016 nothing since "~ 2011"  on disability b/o AKA amputation left leg   Allergies   Benadryl [diphenhydramine hcl]; Promethazine hcl; Augmentin [amoxicillin-pot clavulanate]; and Toradol [ketorolac tromethamine]   Review of Systems Review of Systems  All other systems reviewed and are negative.    Physical Exam Updated Vital Signs BP 110/71 (BP Location: Right Arm)   Pulse 88   Temp 97.8 F (36.6 C) (Oral)   Resp 20   SpO2 98%   Vital signs normal    Physical Exam  Constitutional: He is oriented to person, place, and time. He appears well-developed and well-nourished.  Non-toxic appearance. He does not appear ill. No distress.  HENT:  Head: Normocephalic and atraumatic.  Right Ear: External ear normal.  Left Ear: External ear normal.  Nose: Nose normal. No mucosal edema or rhinorrhea.  Mouth/Throat: Oropharynx is clear and moist and mucous membranes are normal. No dental abscesses or uvula swelling.  Eyes: Conjunctivae and EOM are normal. Pupils are equal, round, and reactive to light.  Neck: Normal range of motion and full passive range of motion without pain. Neck supple.  Cardiovascular: Normal rate, regular rhythm and normal heart sounds.  Exam reveals no gallop and no friction rub.   No  murmur heard. Pulmonary/Chest: Effort normal and breath sounds normal. No respiratory distress. He has no wheezes. He has no rhonchi. He has no rales. He exhibits no tenderness and no crepitus.    Area of pain noted  Abdominal: Soft. Normal appearance and bowel sounds are normal. He exhibits no distension. There is no tenderness. There is no rebound and no guarding.  Musculoskeletal: Normal range of motion. He exhibits no edema or tenderness.  Moves all extremities well.   Neurological: He is alert and oriented to person, place, and time. He has normal strength. No cranial nerve deficit.  Skin: Skin is warm, dry and intact. No rash noted. No erythema. No pallor.  Psychiatric: He has a normal mood and affect. His speech is normal and behavior is normal. His mood appears not anxious.  Nursing note and vitals reviewed.    ED Treatments / Results  Labs (all labs ordered are listed, but only abnormal results are displayed) Results for orders placed or performed during the hospital encounter of 96/22/29  Basic metabolic panel  Result Value Ref Range   Sodium 137 135 - 145 mmol/L   Potassium 3.1 (L) 3.5 - 5.1 mmol/L   Chloride 103 101 - 111 mmol/L   CO2 25 22 - 32 mmol/L   Glucose, Bld 109 (H) 65 - 99 mg/dL   BUN 8 6 - 20 mg/dL   Creatinine, Ser 1.05 0.61 - 1.24 mg/dL   Calcium 9.2 8.9 - 10.3 mg/dL   GFR calc non Af Amer >60 >60 mL/min   GFR calc Af Amer >60 >60 mL/min   Anion gap 9 5 - 15  CBC  Result Value Ref Range   WBC 8.7 4.0 - 10.5 K/uL   RBC 3.90 (L) 4.22 - 5.81 MIL/uL   Hemoglobin 12.8 (L) 13.0 - 17.0 g/dL   HCT 35.9 (L) 39.0 - 52.0 %   MCV 92.1 78.0 - 100.0 fL   MCH 32.8 26.0 - 34.0 pg   MCHC 35.7 30.0 - 36.0 g/dL  RDW 14.0 11.5 - 15.5 %   Platelets 282 150 - 400 K/uL  POCT i-Stat troponin I  Result Value Ref Range   Troponin i, poc 0.01 0.00 - 0.08 ng/mL   Comment 3          POCT i-Stat troponin I  Result Value Ref Range   Troponin i, poc 0.00 0.00 - 0.08 ng/mL    Comment 3           Laboratory interpretation all normal except mild anemia, hypokalemia    EKG  EKG Interpretation  Date/Time:  Thursday May 05 2017 20:50:45 EDT Ventricular Rate:  111 PR Interval:    QRS Duration: 90 QT Interval:  320 QTC Calculation: 435 R Axis:   61 Text Interpretation:  Sinus tachycardia Abnormal R-wave progression, early transition Borderline repolarization abnormality Baseline wander No significant change since last tracing 01 May 2017 Confirmed by Rolland Porter [phone]) on 05/05/2017 11:00:12 PM       Radiology Dg Chest 2 View  Result Date: 05/05/2017 CLINICAL DATA:  Pain in the left chest sharp pressure EXAM: CHEST  2 VIEW COMPARISON:  04/30/2017 FINDINGS: Linear atelectasis at the left base. The right lung is clear. Normal cardiomediastinal silhouette. No pneumothorax. Probable coronary vessel calcification on lateral view. IMPRESSION: No active cardiopulmonary disease. Electronically Signed   By: Donavan Foil M.D.   On: 05/05/2017 21:18    Procedures Procedures (including critical care time)  Medications Ordered in ED Medications  aspirin chewable tablet 324 mg (324 mg Oral Given 05/05/17 2349)  nitroGLYCERIN (NITROGLYN) 2 % ointment 1 inch (1 inch Topical Given 05/05/17 2349)     Initial Impression / Assessment and Plan / ED Course  I have reviewed the triage vital signs and the nursing notes.  Pertinent labs & imaging results that were available during my care of the patient were reviewed by me and considered in my medical decision making (see chart for details).  Patient was given full dose aspirin to chew and nitroglycerin paste. We discussed getting a delta troponin.  The nurse came to me and said patient was wanting something else for pain, I ordered Toradol 15 mg IV. However it was not given. Patient left stating he wanted to go home and take his oxycodone 15 mg.  Patient's delta troponin was negative, his EKG did not show acute changes,  he also had a cardiac catheterization done about 4 months ago that did not show any significant blockage. Patient can follow-up with his cardiologist. Patient however left AMA without getting discharge instructions.  Cardiac Cath Dec 10, 2016 Normal LV function with an ejection fraction of 55-60% without focal segmental wall motion abnormalities.  Mild nonobstructive residual CAD with coronary calcification of the proximal LAD with 20% proximal stenosis, 20% mid stenosis, and a patent distal LAD stent with 10-15% intimal hyperplasia; widely patent stent extending just beyond the ostium of the circumflex vessel into the OM1 vessel with jailing of a small branch of this marginal vessel; and extensive stenting to a dominant RCA with mild 30% focal mid stent narrowing.  RECOMMENDATION: Mediical therapy.   Final Clinical Impressions(s) / ED Diagnoses   Final diagnoses:  Atypical chest pain    Plan discharge  Rolland Porter, MD, Barbette Or, MD 05/06/17 [id number]

## 2017-05-06 NOTE — Telephone Encounter (Signed)
Pt c/o of Chest Pain: STAT if CP now or developed within 24 hours  1. Are you having CP right now? Yes constant   2. Are you experiencing any other symptoms (ex. SOB, nausea, vomiting, sweating)? Yes   3. How long have you been experiencing CP? A week   4. Is your CP continuous or coming and going? Continuous   5. Have you taken Nitroglycerin? yes ?

## 2017-05-06 NOTE — Telephone Encounter (Signed)
Late Entry: 11:24 AM  Received incoming call in Triage. Pt stated he was having active CP which started 1 week ago. Pt stated he was at the ED yesterday. Instructed pt to report to the ED. Pt declined. Pt reported calling our after hours number and was instructed to come in to our office. Pt requested appt to be seen today. Made appt with Melina Copa, PA today at 2:30 PM, arriving at 2:15 PM. Pt verbalized understanding.

## 2017-05-06 NOTE — Patient Instructions (Addendum)
Medication Instructions:  Your physician has recommended you make the following change in your medication:  1.  INCREASE the Protonix to 40 mg daily 2. START Amlodipine 2.5 mg daily 3.  STOP the Aspirin  Labwork: None ordered  Testing/Procedures:   You have been referred to [facility].  THEY SHOULD CONTACT YOU WITH AN APPOINTMENT.   Follow-Up: Your physician recommends that you schedule a follow-up appointment in: 3 MONTHS WITH DR. Marlou Porch   Any Other Special Instructions Will Be Listed Below (If Applicable). I strongly encourage you to discuss with your pain management team your pain issues.   If you need a refill on your cardiac medications before your next appointment, please call your pharmacy.

## 2017-05-06 NOTE — Telephone Encounter (Signed)
[name], I spent more time delving into Aaron Dodson's chart and sometime between December 2017 and January 2018, Eliquis showed up on his medicine list with mention of "prior atrial fib" but we have never had documentation of this. It's just been continued by our office but we don't really know who ever started the Eliquis. Can you contact his pharmacy to see if we can find out who the first prescriber of this medication was? It looks like Karilyn Cota name is on a prior refill but I am interested to see where the original prescription came from. If unable to obtain, please contact Mr. Crysler and ask who placed him on this medicine and where he was at that time so we can obtain formal records. I would hate for him to be on an unnecessary medicine if he has never been formally diagnosed with this. Thanks!  Matthew Cina PA-C
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
## 2017-05-09 NOTE — Telephone Encounter (Signed)
Placed a call to [facility], Dr. Claudie Leach, and spoke with [facility]. She will send over office note and EKG from 07/21/16 visit.

## 2017-05-09 NOTE — Addendum Note (Signed)
Addended by: Marlis Edelson C on: 05/09/2017 02:37 PM   Modules accepted: Orders

## 2017-05-09 NOTE — Telephone Encounter (Signed)
Reviewed records from Dr. Verdon Cummins office. EKG showed NSR. Note references a history of PAF, hence prescription for Eliquis at that time. Can you please contact the patient and find out where and when he was first diagnosed with atrial fib? Sounds like it was even before this appointment. Thanks. Dayna Dunn PA-C

## 2017-05-09 NOTE — Telephone Encounter (Signed)
Can you see if you can get a copy of Dr. Verdon Cummins note and EKG from around that time? Chosen Geske PA-C

## 2017-05-09 NOTE — Telephone Encounter (Signed)
Spoke to Beth @ [facility] ph: [phone] fax: [phone], and she advised me that pt was seen there in 2015 and transferred to [facility], [facility] [place] She needs a ROI signed and sent before she can send me any records.  I am also having 1 signed for [place] [facility] as well, then I will call and request those records once we have ROI in hand.  Pt states he will come by the office and get those signed as soon as he can.

## 2017-05-09 NOTE — Telephone Encounter (Signed)
Spoke with pt's pharmacy, [facility].  Pharmacist informed me that the Eliquis was originally called in by Dr. Lawson Radar 07/21/2016, but pt never picked that up.   Cecilie Kicks, NP, sent in a refill 12/20/16 and pt did pick that up.

## 2017-05-10 ENCOUNTER — Ambulatory Visit: Payer: Medicare HMO | Admitting: Nurse Practitioner

## 2017-05-16 ENCOUNTER — Ambulatory Visit: Payer: Medicare HMO | Admitting: Cardiology

## 2017-05-16 ENCOUNTER — Encounter: Payer: Self-pay | Admitting: Physician Assistant

## 2017-06-02 NOTE — Telephone Encounter (Signed)
Please remind patient to come sign ROI as below, can also consider mailing to his house if he cannot make it up here to the office. Really important that we verify evidence of atrial fib since he continues to remain on Eliquis, as we do not have any prior records of the original events that prompted this med. Dayna Dunn PA-C

## 2017-06-06 NOTE — Telephone Encounter (Signed)
Tried to call pt re: coming to sign a ROL so we can get some records from past physician, and pts # is not working. I called spouse, DPR on file, and left a message for pt.

## 2017-06-15 ENCOUNTER — Telehealth: Payer: Self-pay | Admitting: *Deleted

## 2017-06-15 NOTE — Telephone Encounter (Signed)
3rd attempt to call pt re: signing a ROI so we can send for his records, per Melina Copa, PA-C. Left another message for pt to call back.

## 2017-08-09 ENCOUNTER — Ambulatory Visit: Payer: Medicare HMO | Admitting: Cardiology

## 2017-08-09 NOTE — Progress Notes (Deleted)
Cardiology Office Note    Date:  08/09/2017   ID:  Aaron Dodson, DOB 01-15-1966, MRN [id number]  PCP:  Patient, No Pcp Per  Cardiologist:   Candee Furbish, MD     History of Present Illness:  Aaron Dodson is a 51 y.o. male with a h/o triple vessel CAD s/p remote coronary stenting in [place] and [place] in the past (LAD, OM1 and RCA stents). He had a recent cath here at [facility] in 11/2015 after multiple presentations to the ED for chest pain, revealing nonobstructive CAD. Continued medical therapy recommended. Since then, he was incarcerated for several months and has returned 2 separate occasions with recurrent CP (06/2016- left AMA and + current admission).  Also with a h/o HTN, HLD, prior IVDU and endocarditis in 2016, chronic LBP for which he takes opioids + h/o anxiety.   Occasional "goes into AFIB" I do not see any previous EKGs demonstrating this. Dyspnea moderate walking across parking lot, continues to smoke. 32 ortho surgeries.   Prior CVA.   Past Medical History:  Diagnosis Date  . ADD (attention deficit disorder)   . Anxiety   . Chronic chest pain   . Chronic lower back pain   . Club foot   . COPD (chronic obstructive pulmonary disease) ([place])   . Coronary artery disease    a. s/p multiple prior PCIs (LAD, LCx, OM1 - [place]; [place]). b. Multiple prior caths, last in 12/2016 - medical therapy, no new lesions, nonobstructive.  . Endocarditis 2013  . GERD (gastroesophageal reflux disease)   . History of echocardiogram    Echo 11/09/16: mild LVH, EF 55-60, no RWMA, Gr 1 DD, mildly dilated aortic root (38 mm)  . Hyperlipidemia   . Hypertension   . IV drug abuse   . Myocardial infarction [facility]) 2000's - ~ 04/2015   "I've had a total of 3" (11/08/2016)  . PAF (paroxysmal atrial fibrillation) ([facility])    on Eliquis  . PTSD (post-traumatic stress disorder)   . Pulmonary embolism ([place])    "I've had 2 or 3" (11/08/2016)  . 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  Septic pulmonary embolism ([place]) 2013  . Squamous cell carcinoma of scalp   . Stroke [facility]) ~ 2005-2006 X 2   denies residual on 11/08/2016    Past Surgical History:  Procedure Laterality Date  . ABOVE KNEE LEG AMPUTATION Left   . CARDIAC CATHETERIZATION  "several"  . CARDIAC CATHETERIZATION N/A 11/26/2015   Procedure: Left Heart Cath and Coronary Angiography;  Surgeon: Burnell Blanks, MD;  Location: [facility];  Service: Cardiovascular;  Laterality: N/A;  . CLUB FOOT RELEASE Left May 05, 1967  . CORONARY ANGIOPLASTY  "several"  . CORONARY ANGIOPLASTY WITH STENT PLACEMENT  "several"   "total of 10 stents placed" (11/18/2015)  . LAPAROSCOPIC CHOLECYSTECTOMY    . LEFT HEART CATH AND CORONARY ANGIOGRAPHY N/A 12/20/2016   Procedure: Left Heart Cath and Coronary Angiography;  Surgeon: Troy Sine, MD;  Location: [facility];  Service: Cardiovascular;  Laterality: N/A;  . ORTHOPEDIC SURGERY  08/04/1967-~ 2007   "total of 52 on my left leg; started w/club foot released"  . TONSILLECTOMY      Current Medications: Outpatient Medications Prior to Visit  Medication Sig Dispense Refill  . acetaminophen (TYLENOL) 325 MG tablet Take 2 tablets (650 mg total) by mouth every 4 (four) hours as needed for headache or mild pain.    Marland Kitchen  amLODipine (NORVASC) 2.5 MG tablet Take 1 tablet (2.5 mg total) by mouth daily. 30 tablet 2  . apixaban (ELIQUIS) 5 MG TABS tablet Take 1 tablet (5 mg total) by mouth 2 (two) times daily. 60 tablet 6  . clopidogrel (PLAVIX) 75 MG tablet Take 1 tablet (75 mg total) by mouth daily. 30 tablet 0  . cyclobenzaprine (FLEXERIL) 10 MG tablet Take 10 mg by mouth 3 (three) times daily as needed for muscle spasms.   0  . loperamide (IMODIUM A-D) 2 MG tablet Take 2-4 mg by mouth 4 (four) times daily as needed for diarrhea or loose stools.    . metoprolol succinate (TOPROL-XL) 25 MG 24 hr tablet Take 1 tablet (25 mg total) by mouth daily. 30 tablet 6  . nitroGLYCERIN  (NITROSTAT) 0.4 MG SL tablet Place 1 tablet (0.4 mg total) under the tongue every 5 (five) minutes x 3 doses as needed for chest pain. 25 tablet 6  . oxyCODONE (ROXICODONE) 15 MG immediate release tablet Take 15 mg by mouth every 6 (six) hours. scheduled    . pantoprazole (PROTONIX) 40 MG tablet Take 1 tablet (40 mg total) by mouth daily. 30 tablet 2  . rosuvastatin (CRESTOR) 5 MG tablet Take 5 mg by mouth at bedtime.      No facility-administered medications prior to visit.      Allergies:   Benadryl [diphenhydramine hcl]; Promethazine hcl; Augmentin [amoxicillin-pot clavulanate]; and Toradol [ketorolac tromethamine]   Social History   Social History  . Marital status: Divorced    Spouse name: N/A  . Number of children: N/A  . Years of education: N/A   Social History Main Topics  . Smoking status: Current Some Day Smoker    Packs/day: 0.50    Years: 35.00    Types: Cigarettes  . Smokeless tobacco: Never Used     Comment: 12/17/2016 "using the patch"  . Alcohol use 0.0 oz/week     Comment: 12/17/2016 nothing since "~ 2011"  . Drug use: No     Comment: 2/9//2018 "nothing since 05/2015"  . Sexual activity: Not Currently   Other Topics Concern  . Not on file   Social History Narrative   Lives with ex wife by his report.  On disability.       Family History:  The patient's family history includes Heart attack in his father; Hypertension in his father; Lung cancer in his father.   ROS:   Please see the history of present illness.    ROS All other systems reviewed and are negative.   PHYSICAL EXAM:   VS:  There were no vitals taken for this visit.   GEN: Well nourished, well developed, in no acute distress  HEENT: normal  Neck: no JVD, carotid bruits, or masses Cardiac: RRR; no murmurs, rubs, or gallops,no edema  Respiratory:  clear to auscultation bilaterally, normal work of breathing GI: soft, nontender, nondistended, + BS MS: no deformity or atrophy  Skin: warm and dry,  no rash Neuro:  Alert and Oriented x 3, Strength and sensation are intact Psych: euthymic mood, full affect  Wt Readings from Last 3 Encounters:  05/06/17 192 lb 6.4 oz (87.3 kg)  04/02/17 200 lb (90.7 kg)  03/06/17 200 lb (90.7 kg)      Studies/Labs Reviewed:   EKG:  EKG is not ordered today.    Recent Labs: 12/17/2016: Magnesium 2.0; TSH 0.695 04/30/2017: ALT 10; B Natriuretic Peptide 35.8 05/05/2017: BUN 8; Creatinine, Ser 1.05; Hemoglobin 12.8; Platelets  282; Potassium 3.1; Sodium 137   Lipid Panel No results found for: CHOL, TRIG, HDL, CHOLHDL, VLDL, LDLCALC, LDLDIRECT  Additional studies/ records that were reviewed today include:  Prior office notes reviewed, lab work reviewed.  Cardiac cath 11/26/15: Conclusion   1. Triple vessel CAD 2. The LAD has mild plaque in the proximal segment and a patent stent in the distal/apical segment without restenosis.  3. The Circumflex has an ostial stent that extends into OM1. There is 40% ostial narrowing within the stent. The continuation of the AV groove Circumflex beyond OM1 is jailed by the stent with 50-70% stenosis. This continuation branch of the AV groove Circumflex is small in caliber.  4. The RCA has patent stents extending from the ostium of the vessel down into the PDA. There is diffuse mild stent restenosis.  5. The left main has a distal 30% stenosis.  6. Normal LV systolic function  Recommendations: Continue medical management of CAD. He can be discharged tonight after bedrest.    ECHO 11/26/15: - Left ventricle: The cavity size was normal. Wall thickness was   normal. Systolic function was normal. The estimated ejection   fraction was in the range of 50% to 55%. Wall motion was normal;   there were no regional wall motion abnormalities. Doppler   parameters are consistent with abnormal left ventricular   relaxation (grade 1 diastolic dysfunction). - Aortic valve: Valve mobility was restricted. There was mild    regurgitation.  Impressions:  - Technically difficult; low normal to mild global reduction in LV   function; EF 50; grade 1 diastolic dysfunction; aortic valve not   well visualized but appears calcified with reduced cusp   excursion; no AS by doppler; mild AI.    ASSESSMENT:    No diagnosis found.   PLAN:  In order of problems listed above:  Atypical chest pain  - Musculoskeletal type discomfort in the past. Left heart catheterization in January 2017 showed patent stents, nonobstructive coronary artery disease, normal EF.  Coronary artery disease  - Continue with aggressive secondary prevention.  - Prior stenting of LAD, obtuse marginal 1 and RCA.  - Continue with aspirin and Plavix. Encourage beta blocker, used to be on Bystolic (Thought it was mainly for his blood pressure which has been fairly well controlled.)  - Will switch to metoprolol ER 25mg .  - He previously had been seen by [facility] [place].  Hyperlipidemia  - Encourage Crestor use.  Essential hypertension  - Blood pressure under good control.  Left above-knee amputation  - Prosthesis, born with a clubfoot, nerve damage  Tobacco use  - Encourage cessation. 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   Medication Adjustments/Labs and Tests Ordered: Current medicines are reviewed at length with the patient today.  Concerns regarding medicines are outlined above.  Medication changes, Labs and Tests ordered today are listed in the Patient Instructions below. There are no Patient Instructions on file for this visit.   Signed, Candee Furbish, MD  08/09/2017 9:37 AM    [facility] [address], [place], [place]  [address] Phone: [phone]; Fax: [phone]

## 2017-08-10 ENCOUNTER — Other Ambulatory Visit: Payer: Self-pay | Admitting: Physician Assistant

## 2017-08-10 ENCOUNTER — Encounter: Payer: Self-pay | Admitting: Cardiology

## 2018-01-26 ENCOUNTER — Telehealth: Payer: Self-pay | Admitting: Cardiology

## 2018-01-26 NOTE — Telephone Encounter (Signed)
Pt called in reporting over the past couple of days he has been running a low grade fever. Wife with sick contacts at work. States he was seen at [facility] and had a CXR/US? And told things looked ok. Was given antibiotics but has felt to terrible to go and pick these up. Took his temperature was around 100. Given symptoms I advised that he come in for further evaluation to the ED. Of note hx of endocarditis. Patient understood and agreeable to this plan.   Reino Bellis

## 2018-02-15 ENCOUNTER — Other Ambulatory Visit: Payer: Self-pay

## 2018-02-15 ENCOUNTER — Encounter (HOSPITAL_COMMUNITY): Payer: Self-pay | Admitting: Emergency Medicine

## 2018-02-15 ENCOUNTER — Telehealth: Payer: Self-pay | Admitting: Cardiology

## 2018-02-15 ENCOUNTER — Emergency Department (HOSPITAL_COMMUNITY): Payer: Medicare HMO

## 2018-02-15 ENCOUNTER — Emergency Department (HOSPITAL_COMMUNITY)
Admission: EM | Admit: 2018-02-15 | Discharge: 2018-02-15 | Disposition: A | Discharge status: Home / Self Care | Payer: Medicare HMO | Source: Home / Self Care | Attending: Physician Assistant | Admitting: Physician Assistant

## 2018-02-15 DIAGNOSIS — Z515 Encounter for palliative care: Secondary | ICD-10-CM | POA: Diagnosis not present

## 2018-02-15 DIAGNOSIS — R5383 Other fatigue: Secondary | ICD-10-CM

## 2018-02-15 DIAGNOSIS — I609 Nontraumatic subarachnoid hemorrhage, unspecified: Secondary | ICD-10-CM | POA: Diagnosis not present

## 2018-02-15 DIAGNOSIS — I480 Paroxysmal atrial fibrillation: Secondary | ICD-10-CM | POA: Diagnosis present

## 2018-02-15 DIAGNOSIS — I340 Nonrheumatic mitral (valve) insufficiency: Secondary | ICD-10-CM | POA: Diagnosis present

## 2018-02-15 DIAGNOSIS — Z6821 Body mass index (BMI) 21.0-21.9, adult: Secondary | ICD-10-CM

## 2018-02-15 DIAGNOSIS — F17210 Nicotine dependence, cigarettes, uncomplicated: Secondary | ICD-10-CM | POA: Insufficient documentation

## 2018-02-15 DIAGNOSIS — R634 Abnormal weight loss: Secondary | ICD-10-CM | POA: Diagnosis present

## 2018-02-15 DIAGNOSIS — F1990 Other psychoactive substance use, unspecified, uncomplicated: Secondary | ICD-10-CM

## 2018-02-15 DIAGNOSIS — R29733 NIHSS score 33: Secondary | ICD-10-CM | POA: Diagnosis not present

## 2018-02-15 DIAGNOSIS — N179 Acute kidney failure, unspecified: Secondary | ICD-10-CM | POA: Diagnosis present

## 2018-02-15 DIAGNOSIS — K219 Gastro-esophageal reflux disease without esophagitis: Secondary | ICD-10-CM | POA: Diagnosis present

## 2018-02-15 DIAGNOSIS — Z66 Do not resuscitate: Secondary | ICD-10-CM | POA: Diagnosis not present

## 2018-02-15 DIAGNOSIS — Z7901 Long term (current) use of anticoagulants: Secondary | ICD-10-CM

## 2018-02-15 DIAGNOSIS — A408 Other streptococcal sepsis: Principal | ICD-10-CM | POA: Diagnosis present

## 2018-02-15 DIAGNOSIS — E872 Acidosis: Secondary | ICD-10-CM | POA: Diagnosis present

## 2018-02-15 DIAGNOSIS — I468 Cardiac arrest due to other underlying condition: Secondary | ICD-10-CM | POA: Diagnosis not present

## 2018-02-15 DIAGNOSIS — E871 Hypo-osmolality and hyponatremia: Secondary | ICD-10-CM | POA: Diagnosis present

## 2018-02-15 DIAGNOSIS — Z7902 Long term (current) use of antithrombotics/antiplatelets: Secondary | ICD-10-CM | POA: Insufficient documentation

## 2018-02-15 DIAGNOSIS — I252 Old myocardial infarction: Secondary | ICD-10-CM

## 2018-02-15 DIAGNOSIS — I25110 Atherosclerotic heart disease of native coronary artery with unstable angina pectoris: Secondary | ICD-10-CM | POA: Insufficient documentation

## 2018-02-15 DIAGNOSIS — J432 Centrilobular emphysema: Secondary | ICD-10-CM | POA: Diagnosis present

## 2018-02-15 DIAGNOSIS — I330 Acute and subacute infective endocarditis: Secondary | ICD-10-CM | POA: Diagnosis present

## 2018-02-15 DIAGNOSIS — Z86711 Personal history of pulmonary embolism: Secondary | ICD-10-CM

## 2018-02-15 DIAGNOSIS — Z89612 Acquired absence of left leg above knee: Secondary | ICD-10-CM

## 2018-02-15 DIAGNOSIS — I130 Hypertensive heart and chronic kidney disease with heart failure and stage 1 through stage 4 chronic kidney disease, or unspecified chronic kidney disease: Secondary | ICD-10-CM | POA: Diagnosis present

## 2018-02-15 DIAGNOSIS — M545 Low back pain: Secondary | ICD-10-CM | POA: Diagnosis present

## 2018-02-15 DIAGNOSIS — I2510 Atherosclerotic heart disease of native coronary artery without angina pectoris: Secondary | ICD-10-CM | POA: Diagnosis present

## 2018-02-15 DIAGNOSIS — D649 Anemia, unspecified: Secondary | ICD-10-CM | POA: Diagnosis present

## 2018-02-15 DIAGNOSIS — G8929 Other chronic pain: Secondary | ICD-10-CM | POA: Diagnosis present

## 2018-02-15 DIAGNOSIS — R001 Bradycardia, unspecified: Secondary | ICD-10-CM | POA: Diagnosis not present

## 2018-02-15 DIAGNOSIS — Z79899 Other long term (current) drug therapy: Secondary | ICD-10-CM | POA: Insufficient documentation

## 2018-02-15 DIAGNOSIS — J449 Chronic obstructive pulmonary disease, unspecified: Secondary | ICD-10-CM

## 2018-02-15 DIAGNOSIS — I10 Essential (primary) hypertension: Secondary | ICD-10-CM

## 2018-02-15 DIAGNOSIS — Z801 Family history of malignant neoplasm of trachea, bronchus and lung: Secondary | ICD-10-CM

## 2018-02-15 DIAGNOSIS — Z8249 Family history of ischemic heart disease and other diseases of the circulatory system: Secondary | ICD-10-CM

## 2018-02-15 DIAGNOSIS — R482 Apraxia: Secondary | ICD-10-CM | POA: Diagnosis present

## 2018-02-15 DIAGNOSIS — F1121 Opioid dependence, in remission: Secondary | ICD-10-CM | POA: Diagnosis present

## 2018-02-15 DIAGNOSIS — Z8673 Personal history of transient ischemic attack (TIA), and cerebral infarction without residual deficits: Secondary | ICD-10-CM

## 2018-02-15 DIAGNOSIS — Q231 Congenital insufficiency of aortic valve: Secondary | ICD-10-CM

## 2018-02-15 DIAGNOSIS — J9601 Acute respiratory failure with hypoxia: Secondary | ICD-10-CM | POA: Diagnosis not present

## 2018-02-15 DIAGNOSIS — Z9119 Patient's noncompliance with other medical treatment and regimen: Secondary | ICD-10-CM

## 2018-02-15 DIAGNOSIS — R531 Weakness: Secondary | ICD-10-CM

## 2018-02-15 DIAGNOSIS — N182 Chronic kidney disease, stage 2 (mild): Secondary | ICD-10-CM | POA: Diagnosis present

## 2018-02-15 DIAGNOSIS — R748 Abnormal levels of other serum enzymes: Secondary | ICD-10-CM | POA: Diagnosis present

## 2018-02-15 DIAGNOSIS — R6520 Severe sepsis without septic shock: Secondary | ICD-10-CM | POA: Diagnosis present

## 2018-02-15 DIAGNOSIS — Z955 Presence of coronary angioplasty implant and graft: Secondary | ICD-10-CM | POA: Insufficient documentation

## 2018-02-15 DIAGNOSIS — I5031 Acute diastolic (congestive) heart failure: Secondary | ICD-10-CM | POA: Diagnosis not present

## 2018-02-15 DIAGNOSIS — J441 Chronic obstructive pulmonary disease with (acute) exacerbation: Secondary | ICD-10-CM | POA: Diagnosis not present

## 2018-02-15 DIAGNOSIS — E785 Hyperlipidemia, unspecified: Secondary | ICD-10-CM | POA: Diagnosis present

## 2018-02-15 DIAGNOSIS — I959 Hypotension, unspecified: Secondary | ICD-10-CM | POA: Diagnosis present

## 2018-02-15 DIAGNOSIS — G9382 Brain death: Secondary | ICD-10-CM | POA: Diagnosis not present

## 2018-02-15 DIAGNOSIS — F1510 Other stimulant abuse, uncomplicated: Secondary | ICD-10-CM | POA: Diagnosis present

## 2018-02-15 DIAGNOSIS — E876 Hypokalemia: Secondary | ICD-10-CM | POA: Diagnosis present

## 2018-02-15 DIAGNOSIS — G894 Chronic pain syndrome: Secondary | ICD-10-CM | POA: Diagnosis present

## 2018-02-15 LAB — URINALYSIS, ROUTINE W REFLEX MICROSCOPIC
Bilirubin Urine: NEGATIVE
Glucose, UA: NEGATIVE mg/dL
KETONES UR: NEGATIVE mg/dL
Nitrite: NEGATIVE
Protein, ur: 30 mg/dL — AB
SPECIFIC GRAVITY, URINE: 1.015 (ref 1.005–1.030)
SQUAMOUS EPITHELIAL / LPF: NONE SEEN
pH: 6 (ref 5.0–8.0)

## 2018-02-15 LAB — COMPREHENSIVE METABOLIC PANEL
ALK PHOS: 86 U/L (ref 38–126)
ALT: 9 U/L — AB (ref 17–63)
ANION GAP: 11 (ref 5–15)
AST: 23 U/L (ref 15–41)
Albumin: 2.4 g/dL — ABNORMAL LOW (ref 3.5–5.0)
BILIRUBIN TOTAL: 0.7 mg/dL (ref 0.3–1.2)
BUN: 12 mg/dL (ref 6–20)
CALCIUM: 10.2 mg/dL (ref 8.9–10.3)
CO2: 24 mmol/L (ref 22–32)
CREATININE: 1.19 mg/dL (ref 0.61–1.24)
Chloride: 96 mmol/L — ABNORMAL LOW (ref 101–111)
GFR calc non Af Amer: >60 mL/min (ref >60)
Glucose, Bld: 123 mg/dL — ABNORMAL HIGH (ref 65–99)
Potassium: 3.7 mmol/L (ref 3.5–5.1)
SODIUM: 131 mmol/L — AB (ref 135–145)
TOTAL PROTEIN: 7 g/dL (ref 6.5–8.1)

## 2018-02-15 LAB — CBC WITH DIFFERENTIAL/PLATELET
Basophils Absolute: 0 10*3/uL (ref 0.0–0.1)
Basophils Relative: 0 %
EOS ABS: 0 10*3/uL (ref 0.0–0.7)
Eosinophils Relative: 0 %
HEMATOCRIT: 30.8 % — AB (ref 39.0–52.0)
HEMOGLOBIN: 10.2 g/dL — AB (ref 13.0–17.0)
LYMPHS ABS: 1.1 10*3/uL (ref 0.7–4.0)
LYMPHS PCT: 10 %
MCH: 29.6 pg (ref 26.0–34.0)
MCHC: 33.1 g/dL (ref 30.0–36.0)
MCV: 89.3 fL (ref 78.0–100.0)
MONOS PCT: 4 %
Monocytes Absolute: 0.4 10*3/uL (ref 0.1–1.0)
NEUTROS ABS: 9.7 10*3/uL — AB (ref 1.7–7.7)
NEUTROS PCT: 86 %
Platelets: 178 10*3/uL (ref 150–400)
RBC: 3.45 MIL/uL — AB (ref 4.22–5.81)
RDW: 15.7 % — ABNORMAL HIGH (ref 11.5–15.5)
WBC: 11.3 10*3/uL — AB (ref 4.0–10.5)

## 2018-02-15 LAB — I-STAT CG4 LACTIC ACID, ED: Lactic Acid, Venous: 3.84 mmol/L (ref 0.5–1.9)

## 2018-02-15 MED ORDER — ACETAMINOPHEN 325 MG PO TABS: 650.0000 mg | ORAL_TABLET | Freq: Once | ORAL | Status: DC

## 2018-02-15 NOTE — Telephone Encounter (Signed)
Called by patient to let us know he was checking in to the ED- see previous note today by Harlan Stains NP. Will await ED evaluation.  Kerin Ransom PA-C 02/15/2018 5:27 PM

## 2018-02-15 NOTE — ED Triage Notes (Signed)
States he was diagnosed with pericarditis last Wednesday by PCP and told to come to ED if he wasn't feeling any better.  History of endocarditis.  Low- grade fever x 3 weeks.  Intermittent sob.  Denies chest pain.  C/o cramping to lower back.  Last used Molly 3 days ago.

## 2018-02-15 NOTE — Discharge Instructions (Addendum)
You were seen in the emergency department today and decided to leave against medical advise. You expressed understanding that deciding to leave without a full workup and treatment could result in permanent disability or even death.  You are encouraged to return to the emergency department if you would like to continue your care or if your symptoms worsen.  You will be welcomed if you choose to to return to the emergency department and your care will be resumed.  Please follow-up with your cardiologist or your primary care doctor for further workup and treatment of your symptoms if you choose not to return to the emergency department.

## 2018-02-15 NOTE — ED Notes (Signed)
Provider at bedside

## 2018-02-15 NOTE — ED Provider Notes (Signed)
[facility] Provider Note   CSN: [id number] Arrival date & time: 02/15/18  1723     History   Chief Complaint Chief Complaint  Patient presents with  . pericarditis    HPI Naasir Carreira is a 52 y.o. male.  HPI   Patient is a 52 year old male with a history of substance abuse, anxiety, chronic chest pain, endocarditis, pericarditis who presents the ED today  52 y/o male with a h/o substance abuse, anxiety, endocarditis, pericarditis, chronic chest pain, COPD, CAD, HTN, HLD, MI,pAfib, septic PE, who presents the ED today complaining of generalized weakness and fatigue that began about 3 days ago.  States patient has not felt like his normal self for the last several weeks.  He was seen on 01/26/18 at urgent care and was started on antibiotic at that time.  States that symptoms improved after he started on antibiotic however they returned after finishing antibiotic.  He was seen by his primary care doctor on 02/08/18 at [facility] where he states he had a cardiac ultrasound and was diagnosed pericarditis at that time.  States he was started on an antibiotic at that time.  He is not sure what the medication was that he was started on but states that his symptoms improved after he started taking it.  After finishing this medicine symptoms returned about 3 days ago and he began feeling fatigued again.  States he has chronic dyspnea on exertion which is unchanged today.  He also has a chronic cough which he states is also unchanged today.  Denies URI symptoms, chest pain, nausea, vomiting, abdominal pain, diarrhea, palpitations, urinary symptoms..  States he has had intermittent fevers for the last week.  H/o IVDU. States he has not used heroin in 2.5 years. States he has been using molly daily for the last 1.5 months and states he is injecting it. Denies LE swelling.  Patient has a history of chronic back pain and is endorsing chronic pain today.   Denies any loss control of his bowels or bladder.  Denies any numbness or weakness to his right lower extremity.  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  Past Medical History:  Diagnosis Date  . ADD (attention deficit disorder)   . Anxiety   . Chronic chest pain   . Chronic lower back pain   . Club foot   . COPD (chronic obstructive pulmonary disease) ([place])   . Coronary artery disease    a. s/p multiple prior PCIs (LAD, LCx, OM1 - [place]; [place]). b. Multiple prior caths, last in 12/2016 - medical therapy, no new lesions, nonobstructive.  . Endocarditis 2013  . GERD (gastroesophageal reflux disease)   . History of echocardiogram    Echo 11/09/16: mild LVH, EF 55-60, no RWMA, Gr 1 DD, mildly dilated aortic root (38 mm)  . Hyperlipidemia   . Hypertension   . IV drug abuse ([place])   . Myocardial infarction [facility]) 2000's - ~ 04/2015   "I've had a total of 3" (11/08/2016)  . PAF (paroxysmal atrial fibrillation) ([facility])    on Eliquis  . PTSD (post-traumatic stress disorder)   . Pulmonary embolism ([place])    "I've had 2 or 3" (11/08/2016)  . 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 Septic pulmonary embolism ([place]) 2013  . Squamous cell carcinoma of scalp   . Stroke [facility]) ~ 2005-2006 X 2   denies residual on 11/08/2016  Patient Active Problem List   Diagnosis Date Noted  . Endocarditis 05/06/2017  . Gram-positive bacteremia 05/06/2017  . TIA (transient ischemic attack) 05/06/2017  . Vasovagal syncope 05/06/2017  . Paroxysmal atrial fibrillation ([place]) 12/20/2016  . Unstable angina ([place]) 12/17/2016  . Chronic coronary artery disease   . Coronary atherosclerosis 11/19/2015  . Anxiety state   . Chest pain, unspecified 11/18/2015  . Hyperlipidemia 11/18/2015  . Hypertension 11/18/2015  . Tobacco use disorder 11/18/2015  . AKI (acute kidney injury) ([place]) 05/30/2015  . Bacteremia 05/30/2015  . Right calf pain 05/30/2015  . Syncope 05/02/2015  . S/P  coronary artery stent placement 04/17/2015  . Pain syndrome, chronic 02/11/2015  . Thrombophlebitis of arm, left 01/21/2015  . Backache 10/29/2014  . Chronic back pain 10/29/2014  . Esophageal reflux 10/29/2014  . GERD (gastroesophageal reflux disease) 10/29/2014  . Cerebrovascular disease 09/15/2014  . Pulmonary embolism ([place]) 09/15/2014  . Temporary cerebral vascular dysfunction 09/15/2014  . Status post percutaneous transluminal coronary angioplasty 01/21/2014  . Presence of stent in left circumflex coronary artery 01/21/2014  . Presence of stent in right coronary artery 01/21/2014  . Pure hypercholesterolemia 01/21/2014  . Shortness of breath 01/10/2014    Past Surgical History:  Procedure Laterality Date  . ABOVE KNEE LEG AMPUTATION Left   . CARDIAC CATHETERIZATION  "several"  . CARDIAC CATHETERIZATION N/A 11/26/2015   Procedure: Left Heart Cath and Coronary Angiography;  Surgeon: Burnell Blanks, MD;  Location: [facility];  Service: Cardiovascular;  Laterality: N/A;  . CLUB FOOT RELEASE Left 17-Nov-1966  . CORONARY ANGIOPLASTY  "several"  . CORONARY ANGIOPLASTY WITH STENT PLACEMENT  "several"   "total of 10 stents placed" (11/18/2015)  . LAPAROSCOPIC CHOLECYSTECTOMY    . LEFT HEART CATH AND CORONARY ANGIOGRAPHY N/A 12/20/2016   Procedure: Left Heart Cath and Coronary Angiography;  Surgeon: Troy Sine, MD;  Location: [facility];  Service: Cardiovascular;  Laterality: N/A;  . ORTHOPEDIC SURGERY  1967-07-12-~ 2007   "total of 52 on my left leg; started w/club foot released"  . TONSILLECTOMY          Home Medications    Prior to Admission medications   Medication Sig Start Date End Date Taking? Authorizing Provider  acetaminophen (TYLENOL) 325 MG tablet Take 2 tablets (650 mg total) by mouth every 4 (four) hours as needed for headache or mild pain. 12/20/16   Isaiah Serge, NP  amLODipine (NORVASC) 2.5 MG tablet Take 1 tablet (2.5 mg total) by mouth  daily. 05/06/17 08/04/17  Dunn, Nedra Hai, PA-C  apixaban (ELIQUIS) 5 MG TABS tablet Take 1 tablet (5 mg total) by mouth 2 (two) times daily. 12/21/16   Isaiah Serge, NP  clopidogrel (PLAVIX) 75 MG tablet Take 1 tablet (75 mg total) by mouth daily. 07/22/16   Dessa Phi, DO  cyclobenzaprine (FLEXERIL) 10 MG tablet Take 10 mg by mouth 3 (three) times daily as needed for muscle spasms.  03/10/17   [provider]  loperamide (IMODIUM A-D) 2 MG tablet Take 2-4 mg by mouth 4 (four) times daily as needed for diarrhea or loose stools.    [provider]  metoprolol succinate (TOPROL-XL) 25 MG 24 hr tablet Take 1 tablet (25 mg total) by mouth daily. 10/13/16   Jerline Pain, MD  nitroGLYCERIN (NITROSTAT) 0.4 MG SL tablet Place 1 tablet (0.4 mg total) under the tongue every 5 (five) minutes x 3 doses as needed for chest pain. 12/20/16  Isaiah Serge, NP  oxyCODONE (ROXICODONE) 15 MG immediate release tablet Take 15 mg by mouth every 6 (six) hours. scheduled 04/21/17   [provider]  pantoprazole (PROTONIX) 40 MG tablet take 1 tablet by mouth once daily 08/12/17   Dunn, Dayna N, PA-C  rosuvastatin (CRESTOR) 5 MG tablet Take 5 mg by mouth at bedtime.     [provider]    Family History Family History  Problem Relation Age of Onset  . Hypertension Father   . Lung cancer Father   . Heart attack Father     Social History Social History   Tobacco Use  . Smoking status: Current Every Day Smoker    Packs/day: 0.50    Years: 35.00    Pack years: 17.50    Types: Cigarettes  . Smokeless tobacco: Never Used  Substance Use Topics  . Alcohol use: Not Currently  . Drug use: Yes    Comment: Molly, last used  heroin 2.5 years ago     Allergies   Benadryl [diphenhydramine hcl]; Promethazine hcl; Augmentin [amoxicillin-pot clavulanate]; and Toradol [ketorolac tromethamine]   Review of Systems Review of Systems  Constitutional: Positive for activity change,  fatigue and fever.  HENT: Negative for congestion, rhinorrhea and sore throat.   Eyes: Negative for visual disturbance.  Respiratory: Positive for cough and shortness of breath.   Cardiovascular: Negative for chest pain, palpitations and leg swelling.  Gastrointestinal: Negative for abdominal pain, constipation, diarrhea, nausea and vomiting.  Genitourinary: Negative for flank pain and hematuria.  Musculoskeletal: Positive for back pain.  Skin: Negative for wound.  Neurological: Negative for dizziness, weakness, light-headedness, numbness and headaches.     Physical Exam Updated Vital Signs BP (!) 115/58   Pulse (!) 109   Temp 99 F (37.2 C)   Resp 18   Ht 6' (1.829 m)   Wt 70.8 kg (156 lb)   SpO2 98%   BMI 21.16 kg/m   Physical Exam  Constitutional: He appears well-developed and well-nourished.  Patient appears chronically ill  HENT:  Head: Normocephalic and atraumatic.  Mucous members are dry.  Eyes: Pupils are equal, round, and reactive to light. Conjunctivae and EOM are normal.  Neck: Normal range of motion. Neck supple.  Cardiovascular: Normal rate and regular rhythm.  No murmur heard. Distant heart sounds, no murmurs auscultated  Pulmonary/Chest: Effort normal and breath sounds normal.  Crackles to bases bilaterally  Abdominal: Soft. Bowel sounds are normal. He exhibits no distension. There is no tenderness.  Musculoskeletal: He exhibits no edema.  Neurological: He is alert.  Skin: Skin is warm and dry. Capillary refill takes less than 2 seconds.  Psychiatric: He has a normal mood and affect.  Nursing note and vitals reviewed.    ED Treatments / Results  Labs (all labs ordered are listed, but only abnormal results are displayed) Labs Reviewed  CBC WITH DIFFERENTIAL/PLATELET - Abnormal; Notable for the following components:      Result Value   WBC 11.3 (*)    RBC 3.45 (*)    Hemoglobin 10.2 (*)    HCT 30.8 (*)    RDW 15.7 (*)    Neutro Abs 9.7 (*)     All other components within normal limits  COMPREHENSIVE METABOLIC PANEL - Abnormal; Notable for the following components:   Sodium 131 (*)    Chloride 96 (*)    Glucose, Bld 123 (*)    Albumin 2.4 (*)    ALT 9 (*)  All other components within normal limits  URINALYSIS, ROUTINE W REFLEX MICROSCOPIC - Abnormal; Notable for the following components:   APPearance HAZY (*)    Hgb urine dipstick LARGE (*)    Protein, ur 30 (*)    Leukocytes, UA TRACE (*)    Bacteria, UA RARE (*)    All other components within normal limits  I-STAT CG4 LACTIC ACID, ED - Abnormal; Notable for the following components:   Lactic Acid, Venous 3.84 (*)    All other components within normal limits  CULTURE, BLOOD (ROUTINE X 2)  CULTURE, BLOOD (ROUTINE X 2) W REFLEX TO ID PANEL  BLOOD CULTURE ID PANEL (REFLEXED)    EKG None  Radiology Dg Chest 2 View  Result Date: 02/15/2018 CLINICAL DATA:  Productive cough x2 weeks. Low-grade fever x3 weeks. EXAM: CHEST - 2 VIEW COMPARISON:  05/05/2017 FINDINGS: The heart size and mediastinal contours are within normal limits. Coronary stent noted on the lateral view. No CHF or pulmonary consolidations. Mild emphysematous hyperinflation of the lungs, upper lobe predominant. Mild chronic interstitial prominence. The visualized skeletal structures are unremarkable. IMPRESSION: No active cardiopulmonary disease. Mild emphysematous hyperinflation of the lungs, upper lobe predominant. Electronically Signed   By: Ashley Royalty M.D.   On: 02/15/2018 19:24    Procedures Procedures (including critical care time)  Medications Ordered in ED Medications - No data to display   Initial Impression / Assessment and Plan / ED Course  I have reviewed the triage vital signs and the nursing notes.  Pertinent labs & imaging results that were available during my care of the patient were reviewed by me and considered in my medical decision making (see chart for details).  Patient becoming  agitated and repeatedly not allowing nursing to initiate an IV.  He will not let nursing draw repeat lactic acid or other labs have been ordered.  Patient states that he does not want to stay here with an IV in his arm all night, and states that he wants to leave and follow-up with his cardiologist tomorrow.  I discussed that I do not recommend leaving at this time and recommend that he have further workup given his history of endocarditis, his low-grade fevers here, and tachycardia.  Discussed the risks of permanent disability and death if he chooses to leave without further workup, and he states that he fully understands the risks involved and wants to leave and follow-up tomorrow.  We discussed at length the nature and purpose, risks and benefits, as well as, the alternatives of treatment. Time was given to allow the opportunity to ask questions and consider their options, and after the discussion, the patient decided to refuse the offerred treatment. The patient was informed that refusal could lead to, but was not limited to, death, permanent disability, or severe pain.  Patient's wife was present in the room and I asked her to dissuade him without success.  She states that patient does have capacity to make this decision, and I agree that he also has capacity to do so at this time.  I believe that he understands consequences of his decision. After refusal, I made every reasonable opportunity to treat them to the best of my ability, however patient continually refused further treatment.  The patient was notified that they may return to the emergency department at any time for further treatment and are encouraged to do so. If he chooses not to return to the emergency department I advised him to follow up closely with  his cardiologist and/or primary care doctor and he agrees to do so.  Discussed pt presentation and exam findings with Dr. Thomasene Lot, who agrees with the plan to have pt sign out AMA if he wants to  leave and does not consent to treatment.  Final Clinical Impressions(s) / ED Diagnoses   Final diagnoses:  Other fatigue   52 year old male with a history of IV drug use, endocarditis, and pericarditis presenting with generalized fatigue and malaise, low-grade fevers for a week.  Has a low-grade temp here and tachycardia.  Initial labs were drawn prior to me seeing the patient and repeat labs were not able to be drawn as patient continually declined treatment.  Long discussion with patient was completed as noted above.  Patient ultimately decided to leave AMA as noted above.   ED Discharge Orders    None       Rodney Booze, [place] 02/16/18 1435    [place], Fredia Sorrow, MD 02/18/18 1249

## 2018-02-15 NOTE — ED Notes (Signed)
No addl blood draw,  Per nurse pt is leaving.

## 2018-02-16 ENCOUNTER — Encounter (HOSPITAL_COMMUNITY): Payer: Self-pay | Admitting: *Deleted

## 2018-02-16 ENCOUNTER — Telehealth (HOSPITAL_BASED_OUTPATIENT_CLINIC_OR_DEPARTMENT_OTHER): Payer: Self-pay | Admitting: *Deleted

## 2018-02-16 ENCOUNTER — Emergency Department (HOSPITAL_COMMUNITY)
Admission: EM | Admit: 2018-02-16 | Discharge: 2018-02-17 | Disposition: A | Discharge status: Home / Self Care | Payer: Medicare HMO | Source: Home / Self Care

## 2018-02-16 ENCOUNTER — Other Ambulatory Visit: Payer: Self-pay

## 2018-02-16 DIAGNOSIS — R69 Illness, unspecified: Secondary | ICD-10-CM

## 2018-02-16 DIAGNOSIS — Z5321 Procedure and treatment not carried out due to patient leaving prior to being seen by health care provider: Secondary | ICD-10-CM | POA: Insufficient documentation

## 2018-02-16 LAB — CBC WITH DIFFERENTIAL/PLATELET
BASOS ABS: 0 10*3/uL (ref 0.0–0.1)
Basophils Relative: 0 %
Eosinophils Absolute: 0.1 10*3/uL (ref 0.0–0.7)
Eosinophils Relative: 1 %
HEMATOCRIT: 28.8 % — AB (ref 39.0–52.0)
Hemoglobin: 9.7 g/dL — ABNORMAL LOW (ref 13.0–17.0)
LYMPHS PCT: 18 %
Lymphs Abs: 1.3 10*3/uL (ref 0.7–4.0)
MCH: 29.7 pg (ref 26.0–34.0)
MCHC: 33.7 g/dL (ref 30.0–36.0)
MCV: 88.1 fL (ref 78.0–100.0)
Monocytes Absolute: 0.4 10*3/uL (ref 0.1–1.0)
Monocytes Relative: 6 %
Neutro Abs: 5.6 10*3/uL (ref 1.7–7.7)
Neutrophils Relative %: 75 %
Platelets: 166 10*3/uL (ref 150–400)
RBC: 3.27 MIL/uL — AB (ref 4.22–5.81)
RDW: 15.6 % — ABNORMAL HIGH (ref 11.5–15.5)
WBC: 7.4 10*3/uL (ref 4.0–10.5)

## 2018-02-16 LAB — URINALYSIS, ROUTINE W REFLEX MICROSCOPIC
Glucose, UA: NEGATIVE mg/dL
Ketones, ur: NEGATIVE mg/dL
Nitrite: NEGATIVE
PROTEIN: 30 mg/dL — AB
Specific Gravity, Urine: 1.019 (ref 1.005–1.030)
pH: 5 (ref 5.0–8.0)

## 2018-02-16 LAB — COMPREHENSIVE METABOLIC PANEL
ALBUMIN: 2.3 g/dL — AB (ref 3.5–5.0)
ALT: 11 U/L — ABNORMAL LOW (ref 17–63)
ANION GAP: 11 (ref 5–15)
AST: 33 U/L (ref 15–41)
Alkaline Phosphatase: 78 U/L (ref 38–126)
BILIRUBIN TOTAL: 0.7 mg/dL (ref 0.3–1.2)
BUN: 14 mg/dL (ref 6–20)
CO2: 25 mmol/L (ref 22–32)
Calcium: 10.3 mg/dL (ref 8.9–10.3)
Chloride: 95 mmol/L — ABNORMAL LOW (ref 101–111)
Creatinine, Ser: 1.25 mg/dL — ABNORMAL HIGH (ref 0.61–1.24)
GFR calc Af Amer: >60 mL/min (ref >60)
GLUCOSE: 135 mg/dL — AB (ref 65–99)
Potassium: 3.5 mmol/L (ref 3.5–5.1)
Sodium: 131 mmol/L — ABNORMAL LOW (ref 135–145)
TOTAL PROTEIN: 6.6 g/dL (ref 6.5–8.1)

## 2018-02-16 LAB — BLOOD CULTURE ID PANEL (REFLEXED)
Acinetobacter baumannii: NOT DETECTED
CANDIDA ALBICANS: NOT DETECTED
CANDIDA KRUSEI: NOT DETECTED
CANDIDA PARAPSILOSIS: NOT DETECTED
Candida glabrata: NOT DETECTED
Candida tropicalis: NOT DETECTED
ENTEROBACTER CLOACAE COMPLEX: NOT DETECTED
ENTEROCOCCUS SPECIES: NOT DETECTED
ESCHERICHIA COLI: NOT DETECTED
Enterobacteriaceae species: NOT DETECTED
HAEMOPHILUS INFLUENZAE: NOT DETECTED
KLEBSIELLA OXYTOCA: NOT DETECTED
Klebsiella pneumoniae: NOT DETECTED
Listeria monocytogenes: NOT DETECTED
Neisseria meningitidis: NOT DETECTED
Proteus species: NOT DETECTED
Pseudomonas aeruginosa: NOT DETECTED
STREPTOCOCCUS SPECIES: DETECTED — AB
Serratia marcescens: NOT DETECTED
Staphylococcus aureus (BCID): NOT DETECTED
Staphylococcus species: NOT DETECTED
Streptococcus agalactiae: NOT DETECTED
Streptococcus pneumoniae: NOT DETECTED
Streptococcus pyogenes: NOT DETECTED

## 2018-02-16 LAB — I-STAT CG4 LACTIC ACID, ED: Lactic Acid, Venous: 1.91 mmol/L — ABNORMAL HIGH (ref 0.5–1.9)

## 2018-02-16 NOTE — ED Triage Notes (Signed)
Pt would like to be re-evaluated due to generalized illness.  Pt reports that he recently was diagnosed with pericarditis and continues to feel unwell.  Pt reports IV drug use.  Last shot up [facility] yesterday morning.  No CP or sob, generalized illness.  Pt reports that he was "going to be admitted yesterday but I didn't get a straight answer so I left and I still feel like shit".  Pt looks pale.

## 2018-02-17 ENCOUNTER — Emergency Department (HOSPITAL_COMMUNITY): Payer: Medicare HMO

## 2018-02-17 ENCOUNTER — Inpatient Hospital Stay (HOSPITAL_COMMUNITY)
Admission: EM | Admit: 2018-02-17 | Discharge: 2018-03-08 | DRG: 871 | Disposition: E | Payer: Medicare HMO | Attending: Critical Care Medicine | Admitting: Critical Care Medicine

## 2018-02-17 ENCOUNTER — Encounter (HOSPITAL_COMMUNITY): Payer: Self-pay | Admitting: Emergency Medicine

## 2018-02-17 ENCOUNTER — Other Ambulatory Visit: Payer: Self-pay

## 2018-02-17 DIAGNOSIS — D649 Anemia, unspecified: Secondary | ICD-10-CM | POA: Diagnosis present

## 2018-02-17 DIAGNOSIS — N289 Disorder of kidney and ureter, unspecified: Secondary | ICD-10-CM

## 2018-02-17 DIAGNOSIS — I959 Hypotension, unspecified: Secondary | ICD-10-CM | POA: Diagnosis present

## 2018-02-17 DIAGNOSIS — I609 Nontraumatic subarachnoid hemorrhage, unspecified: Secondary | ICD-10-CM

## 2018-02-17 DIAGNOSIS — I358 Other nonrheumatic aortic valve disorders: Secondary | ICD-10-CM

## 2018-02-17 DIAGNOSIS — F1910 Other psychoactive substance abuse, uncomplicated: Secondary | ICD-10-CM | POA: Diagnosis present

## 2018-02-17 DIAGNOSIS — J441 Chronic obstructive pulmonary disease with (acute) exacerbation: Secondary | ICD-10-CM

## 2018-02-17 DIAGNOSIS — R7881 Bacteremia: Secondary | ICD-10-CM

## 2018-02-17 DIAGNOSIS — J810 Acute pulmonary edema: Secondary | ICD-10-CM

## 2018-02-17 DIAGNOSIS — R0902 Hypoxemia: Secondary | ICD-10-CM

## 2018-02-17 DIAGNOSIS — I214 Non-ST elevation (NSTEMI) myocardial infarction: Secondary | ICD-10-CM | POA: Diagnosis present

## 2018-02-17 DIAGNOSIS — G894 Chronic pain syndrome: Secondary | ICD-10-CM | POA: Diagnosis present

## 2018-02-17 DIAGNOSIS — J9600 Acute respiratory failure, unspecified whether with hypoxia or hypercapnia: Secondary | ICD-10-CM

## 2018-02-17 DIAGNOSIS — L8990 Pressure ulcer of unspecified site, unspecified stage: Secondary | ICD-10-CM

## 2018-02-17 DIAGNOSIS — I76 Septic arterial embolism: Secondary | ICD-10-CM

## 2018-02-17 DIAGNOSIS — R0602 Shortness of breath: Secondary | ICD-10-CM

## 2018-02-17 DIAGNOSIS — B955 Unspecified streptococcus as the cause of diseases classified elsewhere: Secondary | ICD-10-CM

## 2018-02-17 DIAGNOSIS — A491 Streptococcal infection, unspecified site: Secondary | ICD-10-CM

## 2018-02-17 DIAGNOSIS — I2510 Atherosclerotic heart disease of native coronary artery without angina pectoris: Secondary | ICD-10-CM | POA: Diagnosis present

## 2018-02-17 LAB — CBC
HCT: 30.6 % — ABNORMAL LOW (ref 39.0–52.0)
Hemoglobin: 10.4 g/dL — ABNORMAL LOW (ref 13.0–17.0)
MCH: 29.6 pg (ref 26.0–34.0)
MCHC: 34 g/dL (ref 30.0–36.0)
MCV: 87.2 fL (ref 78.0–100.0)
PLATELETS: 187 10*3/uL (ref 150–400)
RBC: 3.51 MIL/uL — AB (ref 4.22–5.81)
RDW: 15.4 % (ref 11.5–15.5)
WBC: 10.7 10*3/uL — AB (ref 4.0–10.5)

## 2018-02-17 LAB — I-STAT TROPONIN, ED: TROPONIN I, POC: 0.26 ng/mL — AB (ref 0.00–0.08)

## 2018-02-17 LAB — BASIC METABOLIC PANEL
Anion gap: 15 (ref 5–15)
BUN: 15 mg/dL (ref 6–20)
CALCIUM: 10.2 mg/dL (ref 8.9–10.3)
CO2: 21 mmol/L — ABNORMAL LOW (ref 22–32)
Chloride: 97 mmol/L — ABNORMAL LOW (ref 101–111)
Creatinine, Ser: 1.48 mg/dL — ABNORMAL HIGH (ref 0.61–1.24)
GFR, EST NON AFRICAN AMERICAN: 53 mL/min — AB (ref >60)
Glucose, Bld: 136 mg/dL — ABNORMAL HIGH (ref 65–99)
Potassium: 3.4 mmol/L — ABNORMAL LOW (ref 3.5–5.1)
SODIUM: 133 mmol/L — AB (ref 135–145)

## 2018-02-17 LAB — I-STAT CG4 LACTIC ACID, ED
LACTIC ACID, VENOUS: 5.17 mmol/L — AB (ref 0.5–1.9)
Lactic Acid, Venous: 4.58 mmol/L (ref 0.5–1.9)

## 2018-02-17 LAB — PROTIME-INR
INR: 1.13
Prothrombin Time: 14.4 seconds (ref 11.4–15.2)

## 2018-02-17 LAB — HEPATIC FUNCTION PANEL
ALT: 12 U/L — AB (ref 17–63)
AST: 31 U/L (ref 15–41)
Albumin: 2.3 g/dL — ABNORMAL LOW (ref 3.5–5.0)
Alkaline Phosphatase: 85 U/L (ref 38–126)
BILIRUBIN INDIRECT: 0.5 mg/dL (ref 0.3–0.9)
Bilirubin, Direct: 0.2 mg/dL (ref 0.1–0.5)
TOTAL PROTEIN: 6.3 g/dL — AB (ref 6.5–8.1)
Total Bilirubin: 0.7 mg/dL (ref 0.3–1.2)

## 2018-02-17 MED ORDER — SODIUM CHLORIDE 0.9 % IV BOLUS (SEPSIS)
1000.0000 mL | Freq: Once | INTRAVENOUS | Status: AC
  Administered 2018-02-17: 1000 mL via INTRAVENOUS

## 2018-02-17 MED ORDER — SODIUM CHLORIDE 0.9 % IV SOLN
2.0000 g | Freq: Once | INTRAVENOUS | Status: AC
  Administered 2018-02-17: 2 g via INTRAVENOUS
  Filled 2018-02-17: qty 20

## 2018-02-17 MED ORDER — SODIUM CHLORIDE 0.9 % IV BOLUS (SEPSIS)
250.0000 mL | Freq: Once | INTRAVENOUS | Status: AC
  Administered 2018-02-18: 250 mL via INTRAVENOUS

## 2018-02-17 MED ORDER — IOPAMIDOL (ISOVUE-370) INJECTION 76%
100.0000 mL | Freq: Once | INTRAVENOUS | Status: AC | PRN
  Administered 2018-02-17: 100 mL via INTRAVENOUS

## 2018-02-17 MED ORDER — IOPAMIDOL (ISOVUE-370) INJECTION 76%
INTRAVENOUS | Status: AC
  Filled 2018-02-17: qty 100

## 2018-02-17 NOTE — ED Notes (Addendum)
Pt request nurse collect blood cultures and all labs during start of IV.  I will notify the nurse.

## 2018-02-17 NOTE — ED Notes (Signed)
Called for room x2 no response.

## 2018-02-17 NOTE — ED Triage Notes (Addendum)
Pt from home. Has been here in the past couple of days but left without being seen d/t wait times and refusal of IV  Pt is a past IV drug user and has recent diagnosis of pericarditis.  Says he "can't breathe"  Fevers continually.  Taking ibuprofen and tylenol at home.

## 2018-02-17 NOTE — ED Notes (Signed)
Dr. Ashok Cordia notified of elevated CG-4 and Trop

## 2018-02-17 NOTE — ED Provider Notes (Signed)
[facility] EMERGENCY DEPARTMENT Provider Note   CSN: [id number] Arrival date & time: 03/02/2018  2028     History   Chief Complaint Chief Complaint  Patient presents with  . Chest Pain    HPI Aaron Dodson is a 52 y.o. male.  HPI  Aaron Dodson is a 52yo male with a history of substance abuse, endocarditis (2016), CAD, HTN, HLD, paroxysmal atrial fibrillation, septic PE (not currently on anticoagulation) who presents to the emergency department for fever, generalized weakness and fatigue.  Patient reports that he has not used heroin in over 2 years, but over the past month and a half he started injecting himself with [place].  States that since this time he has had generalized fatigue with sleeping most of the day.  He has had loss of appetite and lost 36 pounds unintentionally.  He has also had fevers ranging between 102-104 F.  He has had shortness of breath upon exertion which is worse than his normal. States that he has had productive cough of yellow sputum. Reports substernal pressure-like, non-radiating chest pain with exertion. Denies CP at rest.  He was seen by his primary care doctor on 3/21 and started on antibiotic for his symptoms.  He states that he improved initially, but symptoms returned after he finished antibiotics. Patient denies abdominal pain, n/v, dysuria, urinary frequency. Reports that he has had several PE's in the past but is not currently on anticoagulation. He denies pleuritic chest pain, leg swelling/calf tenderness, recent travel.   He was seen here in the ER two days ago in which he had low grade fever 27F, tachycardia, lactic acidosis of 3.84 and leukocytosis of 11.3. He left AMA, not allowing nursing staff to initiate IV and preferred to follow up with his cardiologist Dr. Marlou Porch but does not have an appointment until 4/15.   Per chart review patient has history of CAD s/p remote coronary stenting in [place] and [place] in the past (LAD, OM1  and RCA stents.) Had a cardiac cath 12/20/2016 with non-occlusive disease.    Past Medical History:  Diagnosis Date  . ADD (attention deficit disorder)   . Anxiety   . Chronic chest pain   . Chronic lower back pain   . Club foot   . COPD (chronic obstructive pulmonary disease) ([place])   . Coronary artery disease    a. s/p multiple prior PCIs (LAD, LCx, OM1 - [place]; [place]). b. Multiple prior caths, last in 12/2016 - medical therapy, no new lesions, nonobstructive.  . Endocarditis 2013  . GERD (gastroesophageal reflux disease)   . History of echocardiogram    Echo 11/09/16: mild LVH, EF 55-60, no RWMA, Gr 1 DD, mildly dilated aortic root (38 mm)  . Hyperlipidemia   . Hypertension   . IV drug abuse ([place])   . Myocardial infarction [facility]) 2000's - ~ 04/2015   "I've had a total of 3" (11/08/2016)  . PAF (paroxysmal atrial fibrillation) (HCC)    on Eliquis  . PTSD (post-traumatic stress disorder)   . Pulmonary embolism ([place])    "I've had 2 or 3" (11/08/2016)  . 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 Septic pulmonary embolism ([place]) 2013  . Squamous cell carcinoma of scalp   . Stroke [facility]) ~ 2005-2006 X 2   denies residual on 11/08/2016    Patient Active Problem List   Diagnosis Date Noted  . Endocarditis 05/06/2017  . Gram-positive bacteremia 05/06/2017  . TIA (  transient ischemic attack) 05/06/2017  . Vasovagal syncope 05/06/2017  . Paroxysmal atrial fibrillation ([place]) 12/20/2016  . Unstable angina ([place]) 12/17/2016  . Chronic coronary artery disease   . Coronary atherosclerosis 11/19/2015  . Anxiety state   . Chest pain, unspecified 11/18/2015  . Hyperlipidemia 11/18/2015  . Hypertension 11/18/2015  . Tobacco use disorder 11/18/2015  . AKI (acute kidney injury) ([place]) 05/30/2015  . Bacteremia 05/30/2015  . Right calf pain 05/30/2015  . Syncope 05/02/2015  . S/P coronary artery stent placement 04/17/2015  . Pain syndrome, chronic 02/11/2015  .  Thrombophlebitis of arm, left 01/21/2015  . Backache 10/29/2014  . Chronic back pain 10/29/2014  . Esophageal reflux 10/29/2014  . GERD (gastroesophageal reflux disease) 10/29/2014  . Cerebrovascular disease 09/15/2014  . Pulmonary embolism ([place]) 09/15/2014  . Temporary cerebral vascular dysfunction 09/15/2014  . Status post percutaneous transluminal coronary angioplasty 01/21/2014  . Presence of stent in left circumflex coronary artery 01/21/2014  . Presence of stent in right coronary artery 01/21/2014  . Pure hypercholesterolemia 01/21/2014  . Shortness of breath 01/10/2014    Past Surgical History:  Procedure Laterality Date  . ABOVE KNEE LEG AMPUTATION Left   . CARDIAC CATHETERIZATION  "several"  . CARDIAC CATHETERIZATION N/A 11/26/2015   Procedure: Left Heart Cath and Coronary Angiography;  Surgeon: Burnell Blanks, MD;  Location: [place] CV LAB;  Service: Cardiovascular;  Laterality: N/A;  . CLUB FOOT RELEASE Left 02/12/1967  . CORONARY ANGIOPLASTY  "several"  . CORONARY ANGIOPLASTY WITH STENT PLACEMENT  "several"   "total of 10 stents placed" (11/18/2015)  . LAPAROSCOPIC CHOLECYSTECTOMY    . LEFT HEART CATH AND CORONARY ANGIOGRAPHY N/A 12/20/2016   Procedure: Left Heart Cath and Coronary Angiography;  Surgeon: Troy Sine, MD;  Location: [place] CV LAB;  Service: Cardiovascular;  Laterality: N/A;  . ORTHOPEDIC SURGERY  02-Nov-1967-~ 2007   "total of 52 on my left leg; started w/club foot released"  . TONSILLECTOMY          Home Medications    Prior to Admission medications   Medication Sig Start Date End Date Taking? Authorizing Provider  acetaminophen (TYLENOL) 325 MG tablet Take 2 tablets (650 mg total) by mouth every 4 (four) hours as needed for headache or mild pain. 12/20/16   Isaiah Serge, NP  amLODipine (NORVASC) 2.5 MG tablet Take 1 tablet (2.5 mg total) by mouth daily. 05/06/17 08/04/17  Dunn, Nedra Hai, PA-C  apixaban (ELIQUIS) 5 MG TABS tablet Take  1 tablet (5 mg total) by mouth 2 (two) times daily. 12/21/16   Isaiah Serge, NP  clopidogrel (PLAVIX) 75 MG tablet Take 1 tablet (75 mg total) by mouth daily. 07/22/16   Dessa Phi, DO  cyclobenzaprine (FLEXERIL) 10 MG tablet Take 10 mg by mouth 3 (three) times daily as needed for muscle spasms.  03/10/17   [provider]  loperamide (IMODIUM A-D) 2 MG tablet Take 2-4 mg by mouth 4 (four) times daily as needed for diarrhea or loose stools.    [provider]  metoprolol succinate (TOPROL-XL) 25 MG 24 hr tablet Take 1 tablet (25 mg total) by mouth daily. 10/13/16   Jerline Pain, MD  nitroGLYCERIN (NITROSTAT) 0.4 MG SL tablet Place 1 tablet (0.4 mg total) under the tongue every 5 (five) minutes x 3 doses as needed for chest pain. 12/20/16   Isaiah Serge, NP  oxyCODONE (ROXICODONE) 15 MG immediate release tablet Take 15 mg by mouth every 6 (six)  hours. scheduled 04/21/17   [provider]  pantoprazole (PROTONIX) 40 MG tablet take 1 tablet by mouth once daily 08/12/17   Dunn, Dayna N, PA-C  rosuvastatin (CRESTOR) 5 MG tablet Take 5 mg by mouth at bedtime.     [provider]    Family History Family History  Problem Relation Age of Onset  . Hypertension Father   . Lung cancer Father   . Heart attack Father     Social History Social History   Tobacco Use  . Smoking status: Current Every Day Smoker    Packs/day: 0.50    Years: 35.00    Pack years: 17.50    Types: Cigarettes  . Smokeless tobacco: Never Used  Substance Use Topics  . Alcohol use: Not Currently  . Drug use: Yes    Comment: [place], last used  heroin 2.5 years ago     Allergies   Benadryl [diphenhydramine hcl]; Promethazine hcl; Augmentin [amoxicillin-pot clavulanate]; and Toradol [ketorolac tromethamine]   Review of Systems Review of Systems  Constitutional: Positive for chills, fatigue, fever and unexpected weight change (unintentional 35lb weight loss).  Eyes: Negative for  visual disturbance.  Respiratory: Positive for cough (productive yellow sputum) and shortness of breath (on exertion). Negative for wheezing.   Cardiovascular: Positive for chest pain (on exertion). Negative for leg swelling.  Gastrointestinal: Negative for abdominal pain, nausea and vomiting.  Genitourinary: Negative for dysuria and frequency.  Musculoskeletal: Negative for gait problem.  Skin: Negative for rash.  Neurological: Negative for light-headedness and headaches.  Psychiatric/Behavioral: Negative for agitation.     Physical Exam Updated Vital Signs BP (!) 90/45   Pulse (!) 108   Temp 100 F (37.8 C) (Oral)   Resp 17   Ht 6' (1.829 m)   Wt 70.8 kg (156 lb)   SpO2 97%   BMI 21.16 kg/m   Physical Exam  Constitutional: He is oriented to person, place, and time. He appears well-developed and well-nourished. No distress.  Chronically ill appearing.   HENT:  Head: Normocephalic and atraumatic.  Mucous membranes dry  Eyes: Pupils are equal, round, and reactive to light. Conjunctivae are normal. Right eye exhibits no discharge. Left eye exhibits no discharge.  Neck: Normal range of motion. Neck supple. No JVD present. No tracheal deviation present.  Cardiovascular:  Tachycardic, regular rhythm. Distant heart sounds, no murmur appreciated.   Pulmonary/Chest: Effort normal and breath sounds normal. No stridor. No respiratory distress. He has no wheezes. He has no rales.  No respiratory distress. Speaking in full sentences. Lungs CTA.  Abdominal: Soft. Bowel sounds are normal. There is no tenderness. There is no guarding.  Musculoskeletal:  No leg swelling or calf tenderness.   Neurological: He is alert and oriented to person, place, and time. Coordination normal.  Skin: Skin is warm and dry. He is not diaphoretic.  Psychiatric: He has a normal mood and affect. His behavior is normal.  Nursing note and vitals reviewed.    ED Treatments / Results  Labs (all labs ordered  are listed, but only abnormal results are displayed) Labs Reviewed  BASIC METABOLIC PANEL - Abnormal; Notable for the following components:      Result Value   Sodium 133 (*)    Potassium 3.4 (*)    Chloride 97 (*)    CO2 21 (*)    Glucose, Bld 136 (*)    Creatinine, Ser 1.48 (*)    GFR calc non Af Amer 53 (*)    All  other components within normal limits  CBC - Abnormal; Notable for the following components:   WBC 10.7 (*)    RBC 3.51 (*)    Hemoglobin 10.4 (*)    HCT 30.6 (*)    All other components within normal limits  HEPATIC FUNCTION PANEL - Abnormal; Notable for the following components:   Total Protein 6.3 (*)    Albumin 2.3 (*)    ALT 12 (*)    All other components within normal limits  I-STAT TROPONIN, ED - Abnormal; Notable for the following components:   Troponin i, poc 0.26 (*)    All other components within normal limits  I-STAT CG4 LACTIC ACID, ED - Abnormal; Notable for the following components:   Lactic Acid, Venous 5.17 (*)    All other components within normal limits  I-STAT CG4 LACTIC ACID, ED - Abnormal; Notable for the following components:   Lactic Acid, Venous 4.58 (*)    All other components within normal limits  CULTURE, BLOOD (ROUTINE X 2)  CULTURE, BLOOD (ROUTINE X 2)  PROTIME-INR  URINALYSIS, ROUTINE W REFLEX MICROSCOPIC    EKG None  Radiology Dg Chest 2 View  Result Date: 02/10/2018 CLINICAL DATA:  Shortness of breath and fever EXAM: CHEST - 2 VIEW COMPARISON:  February 15, 2018 FINDINGS: There is no edema or consolidation. The heart size and pulmonary vascularity are normal. No adenopathy. There are multiple foci of coronary artery calcification. No bone lesions. IMPRESSION: Extensive coronary artery calcification.  No edema or consolidation. Electronically Signed   By: Lowella Grip III M.D.   On: 02/16/2018 21:25   Ct Angio Chest Pe W And/or Wo Contrast  Result Date: 02/26/2018 CLINICAL DATA:  Pericarditis, difficulty breathing with  fevers. History of IVDA. EXAM: CT ANGIOGRAPHY CHEST WITH CONTRAST TECHNIQUE: Multidetector CT imaging of the chest was performed using the standard protocol during bolus administration of intravenous contrast. Multiplanar CT image reconstructions and MIPs were obtained to evaluate the vascular anatomy. CONTRAST:  164mL ISOVUE-370 IOPAMIDOL (ISOVUE-370) INJECTION 76% COMPARISON:  11/09/2016 CT FINDINGS: Cardiovascular: The study is of quality for the evaluation of pulmonary embolism. There are no filling defects in the central, lobar, segmental or subsegmental pulmonary artery branches to suggest acute pulmonary embolism. Great vessels are normal in course and caliber. Top-normal heart size. No significant pericardial fluid/thickening. Mediastinum/Nodes: No discrete thyroid nodules. Unremarkable esophagus. No pathologically enlarged axillary, mediastinal or hilar lymph nodes. Lungs/Pleura: No pneumothorax. No pleural effusion. Stable 7 mm nodular density in the medial left upper lobe, series 6/20. Centrilobular emphysema without acute pneumonic consolidation. Bibasilar dependent atelectasis. Upper abdomen: Unremarkable. Musculoskeletal:  No aggressive appearing focal osseous lesions. Review of the MIP images confirms the above findings. IMPRESSION: 1. No acute pulmonary embolus. 2. Centrilobular emphysema. 3. Stable 7 mm nodular density in the medial left lung apex. As this appears stable since 11/09/2016, additional follow-up at 18-24 months from the 11/09/2016 study is recommended in high risk patients which given patient's COPD and IVDA history, would be recommended. This recommendation follows the consensus statement: Guidelines for Management of Incidental Pulmonary Nodules Detected on CT Images: From the Fleischner Society 2017; Radiology 2017; 284:228-243. Emphysema (ICD10-J43.9). Electronically Signed   By: Ashley Royalty M.D.   On: 02/11/2018 23:15    Procedures Procedures (including critical care  time)  CRITICAL CARE Performed by: Glyn Ade   Total critical care time: 35 minutes  Critical care time was exclusive of separately billable procedures and treating other patients.  Critical care was  necessary to treat or prevent imminent or life-threatening deterioration.  Critical care was time spent personally by me on the following activities: development of treatment plan with patient and/or surrogate as well as nursing, discussions with consultants, evaluation of patient's response to treatment, examination of patient, obtaining history from patient or surrogate, ordering and performing treatments and interventions, ordering and review of laboratory studies, ordering and review of radiographic studies, pulse oximetry and re-evaluation of patient's condition.  Medications Ordered in ED Medications  sodium chloride 0.9 % bolus 1,000 mL (1,000 mLs Intravenous New Bag/Given 03/01/2018 2238)    And  sodium chloride 0.9 % bolus 1,000 mL (1,000 mLs Intravenous New Bag/Given 02/14/2018 2315)    And  sodium chloride 0.9 % bolus 250 mL (has no administration in time range)  iopamidol (ISOVUE-370) 76 % injection (has no administration in time range)  cefTRIAXone (ROCEPHIN) 2 g in sodium chloride 0.9 % 100 mL IVPB (0 g Intravenous Stopped 02/18/18 0008)  iopamidol (ISOVUE-370) 76 % injection 100 mL (100 mLs Intravenous Contrast Given 02/28/2018 2245)     Initial Impression / Assessment and Plan / ED Course  I have reviewed the triage vital signs and the nursing notes.  Pertinent labs & imaging results that were available during my care of the patient were reviewed by me and considered in my medical decision making (see chart for details).     Patient is septic on arrival. Has fever of 100F, tachycardic to 125bpm, hypotensive with blood pressure of 90/45 . He has a leukocytosis with WBC 10.7 and lactic acidosis of 5.17. Code sepsis called, blood cultures ordered, IV fluids started per sepsis  protocol. CXR without acute abnormality. Source of infection presumably endocarditis given patient's history of IV drug use and endocarditis in the past with recent fevers, fatigue and weight loss. He had blood cultures drawn two days ago positive for strep viridans.   Patient refusing having two IVs placed. Refusing Vancomycin as it has given him "kidney problems" in the past. Discussed this patient with pharmacy who suggest ceftriaxone for strep viridans given patient refusing vancomycin.  Ceftriaxone ordered.  Patient also has an elevated troponin of 0.26, this is new for him.  Has ST depressions in V4 through V6 which is also new. This is likely demand ischemia in the setting of patient's sepsis. Discussed with Dr. Ellender Hose who suggests cardiology consult. Cardiology paged.   Patient has a history of several PE's, is not currently on anticoagulation. Reports having worsening shortness of breath on exertion. Plan to get CT angio chest to further evaluate for PE or abscess of the lung. Discussed this patient with Dr. Ellender Hose who also saw the patient and agrees with above plan. CT angio of the chest negative for acute PE. Does show 11mm nodule in the left middle lung which is stable from previous CT scan in 2018.  Cardiologist Dr. Radford Pax paged several times with no response. Page sent out to Dr. Judene Companion who is covering [facility] Cardiology. Spoke with him about patient and he recommends maintenance heparin drip given no contraindication to heparin. He suggests routine cardiology consult in the morning given new ST depression and elevated troponin. I was then able to get in touch with Dr. Radford Pax who agrees with plan to start heparin and have cardiology follow up in the morning.   Plan to admit patient for sepsis. Sign out given at shift change to Dr. Tyrone Nine.   Final Clinical Impressions(s) / ED Diagnoses   Final diagnoses:  None  ED Discharge Orders    None       Bernarda Caffey 02/18/18 1152    Duffy Bruce, MD 02/19/18 [phone]

## 2018-02-18 ENCOUNTER — Inpatient Hospital Stay (HOSPITAL_COMMUNITY): Payer: Medicare HMO

## 2018-02-18 ENCOUNTER — Encounter (HOSPITAL_COMMUNITY): Payer: Self-pay | Admitting: Radiology

## 2018-02-18 DIAGNOSIS — J9600 Acute respiratory failure, unspecified whether with hypoxia or hypercapnia: Secondary | ICD-10-CM | POA: Diagnosis not present

## 2018-02-18 DIAGNOSIS — I2510 Atherosclerotic heart disease of native coronary artery without angina pectoris: Secondary | ICD-10-CM | POA: Diagnosis present

## 2018-02-18 DIAGNOSIS — I468 Cardiac arrest due to other underlying condition: Secondary | ICD-10-CM | POA: Diagnosis not present

## 2018-02-18 DIAGNOSIS — I252 Old myocardial infarction: Secondary | ICD-10-CM | POA: Diagnosis not present

## 2018-02-18 DIAGNOSIS — N289 Disorder of kidney and ureter, unspecified: Secondary | ICD-10-CM

## 2018-02-18 DIAGNOSIS — J441 Chronic obstructive pulmonary disease with (acute) exacerbation: Secondary | ICD-10-CM | POA: Diagnosis not present

## 2018-02-18 DIAGNOSIS — G8929 Other chronic pain: Secondary | ICD-10-CM | POA: Diagnosis present

## 2018-02-18 DIAGNOSIS — I080 Rheumatic disorders of both mitral and aortic valves: Secondary | ICD-10-CM | POA: Diagnosis not present

## 2018-02-18 DIAGNOSIS — I214 Non-ST elevation (NSTEMI) myocardial infarction: Secondary | ICD-10-CM | POA: Diagnosis not present

## 2018-02-18 DIAGNOSIS — G894 Chronic pain syndrome: Secondary | ICD-10-CM | POA: Diagnosis not present

## 2018-02-18 DIAGNOSIS — Q231 Congenital insufficiency of aortic valve: Secondary | ICD-10-CM | POA: Diagnosis not present

## 2018-02-18 DIAGNOSIS — D649 Anemia, unspecified: Secondary | ICD-10-CM | POA: Diagnosis present

## 2018-02-18 DIAGNOSIS — Z01810 Encounter for preprocedural cardiovascular examination: Secondary | ICD-10-CM | POA: Diagnosis not present

## 2018-02-18 DIAGNOSIS — Z881 Allergy status to other antibiotic agents status: Secondary | ICD-10-CM | POA: Diagnosis not present

## 2018-02-18 DIAGNOSIS — I959 Hypotension, unspecified: Secondary | ICD-10-CM | POA: Diagnosis present

## 2018-02-18 DIAGNOSIS — Z66 Do not resuscitate: Secondary | ICD-10-CM | POA: Diagnosis not present

## 2018-02-18 DIAGNOSIS — B954 Other streptococcus as the cause of diseases classified elsewhere: Secondary | ICD-10-CM | POA: Diagnosis not present

## 2018-02-18 DIAGNOSIS — Z888 Allergy status to other drugs, medicaments and biological substances status: Secondary | ICD-10-CM | POA: Diagnosis not present

## 2018-02-18 DIAGNOSIS — E876 Hypokalemia: Secondary | ICD-10-CM | POA: Diagnosis present

## 2018-02-18 DIAGNOSIS — F17210 Nicotine dependence, cigarettes, uncomplicated: Secondary | ICD-10-CM | POA: Diagnosis present

## 2018-02-18 DIAGNOSIS — F1910 Other psychoactive substance abuse, uncomplicated: Secondary | ICD-10-CM | POA: Diagnosis present

## 2018-02-18 DIAGNOSIS — I5031 Acute diastolic (congestive) heart failure: Secondary | ICD-10-CM | POA: Diagnosis not present

## 2018-02-18 DIAGNOSIS — I340 Nonrheumatic mitral (valve) insufficiency: Secondary | ICD-10-CM | POA: Diagnosis not present

## 2018-02-18 DIAGNOSIS — R0902 Hypoxemia: Secondary | ICD-10-CM | POA: Diagnosis not present

## 2018-02-18 DIAGNOSIS — G9340 Encephalopathy, unspecified: Secondary | ICD-10-CM | POA: Diagnosis not present

## 2018-02-18 DIAGNOSIS — I351 Nonrheumatic aortic (valve) insufficiency: Secondary | ICD-10-CM | POA: Diagnosis not present

## 2018-02-18 DIAGNOSIS — I358 Other nonrheumatic aortic valve disorders: Secondary | ICD-10-CM | POA: Diagnosis not present

## 2018-02-18 DIAGNOSIS — J9601 Acute respiratory failure with hypoxia: Secondary | ICD-10-CM | POA: Diagnosis not present

## 2018-02-18 DIAGNOSIS — N179 Acute kidney failure, unspecified: Secondary | ICD-10-CM | POA: Diagnosis present

## 2018-02-18 DIAGNOSIS — E785 Hyperlipidemia, unspecified: Secondary | ICD-10-CM | POA: Diagnosis present

## 2018-02-18 DIAGNOSIS — I609 Nontraumatic subarachnoid hemorrhage, unspecified: Secondary | ICD-10-CM | POA: Diagnosis not present

## 2018-02-18 DIAGNOSIS — Z89612 Acquired absence of left leg above knee: Secondary | ICD-10-CM | POA: Diagnosis not present

## 2018-02-18 DIAGNOSIS — R748 Abnormal levels of other serum enzymes: Secondary | ICD-10-CM | POA: Diagnosis not present

## 2018-02-18 DIAGNOSIS — R7881 Bacteremia: Secondary | ICD-10-CM | POA: Diagnosis not present

## 2018-02-18 DIAGNOSIS — A408 Other streptococcal sepsis: Secondary | ICD-10-CM | POA: Diagnosis present

## 2018-02-18 DIAGNOSIS — E871 Hypo-osmolality and hyponatremia: Secondary | ICD-10-CM | POA: Diagnosis present

## 2018-02-18 DIAGNOSIS — F1990 Other psychoactive substance use, unspecified, uncomplicated: Secondary | ICD-10-CM | POA: Diagnosis not present

## 2018-02-18 DIAGNOSIS — I130 Hypertensive heart and chronic kidney disease with heart failure and stage 1 through stage 4 chronic kidney disease, or unspecified chronic kidney disease: Secondary | ICD-10-CM | POA: Diagnosis present

## 2018-02-18 DIAGNOSIS — J810 Acute pulmonary edema: Secondary | ICD-10-CM | POA: Diagnosis not present

## 2018-02-18 DIAGNOSIS — I480 Paroxysmal atrial fibrillation: Secondary | ICD-10-CM | POA: Diagnosis present

## 2018-02-18 DIAGNOSIS — E872 Acidosis: Secondary | ICD-10-CM | POA: Diagnosis present

## 2018-02-18 DIAGNOSIS — B1920 Unspecified viral hepatitis C without hepatic coma: Secondary | ICD-10-CM | POA: Diagnosis not present

## 2018-02-18 DIAGNOSIS — R251 Tremor, unspecified: Secondary | ICD-10-CM | POA: Diagnosis not present

## 2018-02-18 DIAGNOSIS — R011 Cardiac murmur, unspecified: Secondary | ICD-10-CM | POA: Diagnosis not present

## 2018-02-18 DIAGNOSIS — Z515 Encounter for palliative care: Secondary | ICD-10-CM | POA: Diagnosis not present

## 2018-02-18 DIAGNOSIS — F1111 Opioid abuse, in remission: Secondary | ICD-10-CM | POA: Diagnosis not present

## 2018-02-18 DIAGNOSIS — B955 Unspecified streptococcus as the cause of diseases classified elsewhere: Secondary | ICD-10-CM | POA: Diagnosis not present

## 2018-02-18 DIAGNOSIS — R0602 Shortness of breath: Secondary | ICD-10-CM | POA: Diagnosis not present

## 2018-02-18 DIAGNOSIS — G9382 Brain death: Secondary | ICD-10-CM | POA: Diagnosis not present

## 2018-02-18 DIAGNOSIS — K08109 Complete loss of teeth, unspecified cause, unspecified class: Secondary | ICD-10-CM | POA: Diagnosis not present

## 2018-02-18 DIAGNOSIS — I339 Acute and subacute endocarditis, unspecified: Secondary | ICD-10-CM | POA: Diagnosis not present

## 2018-02-18 DIAGNOSIS — I330 Acute and subacute infective endocarditis: Secondary | ICD-10-CM | POA: Diagnosis present

## 2018-02-18 DIAGNOSIS — F419 Anxiety disorder, unspecified: Secondary | ICD-10-CM | POA: Diagnosis not present

## 2018-02-18 LAB — BLOOD CULTURE ID PANEL (REFLEXED)
Acinetobacter baumannii: NOT DETECTED
CANDIDA PARAPSILOSIS: NOT DETECTED
CANDIDA TROPICALIS: NOT DETECTED
Candida albicans: NOT DETECTED
Candida glabrata: NOT DETECTED
Candida krusei: NOT DETECTED
ENTEROBACTERIACEAE SPECIES: NOT DETECTED
Enterobacter cloacae complex: NOT DETECTED
Enterococcus species: NOT DETECTED
Escherichia coli: NOT DETECTED
HAEMOPHILUS INFLUENZAE: NOT DETECTED
KLEBSIELLA OXYTOCA: NOT DETECTED
KLEBSIELLA PNEUMONIAE: NOT DETECTED
Listeria monocytogenes: NOT DETECTED
NEISSERIA MENINGITIDIS: NOT DETECTED
PROTEUS SPECIES: NOT DETECTED
Pseudomonas aeruginosa: NOT DETECTED
SERRATIA MARCESCENS: NOT DETECTED
STAPHYLOCOCCUS AUREUS BCID: NOT DETECTED
STAPHYLOCOCCUS SPECIES: NOT DETECTED
STREPTOCOCCUS AGALACTIAE: NOT DETECTED
STREPTOCOCCUS SPECIES: DETECTED — AB
Streptococcus pneumoniae: NOT DETECTED
Streptococcus pyogenes: NOT DETECTED

## 2018-02-18 LAB — MRSA PCR SCREENING: MRSA by PCR: NEGATIVE

## 2018-02-18 LAB — URINALYSIS, ROUTINE W REFLEX MICROSCOPIC
BACTERIA UA: NONE SEEN
Bilirubin Urine: NEGATIVE
Glucose, UA: NEGATIVE mg/dL
KETONES UR: NEGATIVE mg/dL
NITRITE: NEGATIVE
PROTEIN: NEGATIVE mg/dL
Specific Gravity, Urine: 1.024 (ref 1.005–1.030)
pH: 5 (ref 5.0–8.0)

## 2018-02-18 LAB — CULTURE, BLOOD (ROUTINE X 2): Special Requests: ADEQUATE

## 2018-02-18 LAB — TROPONIN I
TROPONIN I: 0.23 ng/mL — AB (ref <0.03)
Troponin I: 0.18 ng/mL (ref <0.03)
Troponin I: 0.16 ng/mL (ref <0.03)

## 2018-02-18 LAB — BASIC METABOLIC PANEL
Anion gap: 12 (ref 5–15)
BUN: 13 mg/dL (ref 6–20)
CALCIUM: 9.1 mg/dL (ref 8.9–10.3)
CO2: 21 mmol/L — AB (ref 22–32)
CREATININE: 1.39 mg/dL — AB (ref 0.61–1.24)
Chloride: 99 mmol/L — ABNORMAL LOW (ref 101–111)
GFR calc non Af Amer: 57 mL/min — ABNORMAL LOW (ref >60)
GLUCOSE: 102 mg/dL — AB (ref 65–99)
Potassium: 3.4 mmol/L — ABNORMAL LOW (ref 3.5–5.1)
Sodium: 132 mmol/L — ABNORMAL LOW (ref 135–145)

## 2018-02-18 LAB — I-STAT CG4 LACTIC ACID, ED
LACTIC ACID, VENOUS: 2.26 mmol/L — AB (ref 0.5–1.9)
Lactic Acid, Venous: 2.57 mmol/L (ref 0.5–1.9)

## 2018-02-18 LAB — HEPARIN LEVEL (UNFRACTIONATED)
HEPARIN UNFRACTIONATED: 0.11 [IU]/mL — AB (ref 0.30–0.70)
Heparin Unfractionated: 0.21 IU/mL — ABNORMAL LOW (ref 0.30–0.70)

## 2018-02-18 MED ORDER — ONDANSETRON HCL 4 MG/2ML IJ SOLN
4.0000 mg | Freq: Four times a day (QID) | INTRAMUSCULAR | Status: DC | PRN
  Administered 2018-02-18 – 2018-02-23 (×2): 4 mg via INTRAVENOUS
  Filled 2018-02-18 (×2): qty 2

## 2018-02-18 MED ORDER — POTASSIUM CHLORIDE IN NACL 20-0.9 MEQ/L-% IV SOLN
INTRAVENOUS | Status: AC
  Administered 2018-02-18 (×2): via INTRAVENOUS
  Filled 2018-02-18 (×3): qty 1000

## 2018-02-18 MED ORDER — MORPHINE SULFATE (PF) 4 MG/ML IV SOLN: 2.0000 mg | INTRAVENOUS | Status: DC | PRN

## 2018-02-18 MED ORDER — CYCLOBENZAPRINE HCL 10 MG PO TABS
10.0000 mg | ORAL_TABLET | Freq: Three times a day (TID) | ORAL | Status: DC | PRN
  Administered 2018-02-18: 10 mg via ORAL
  Filled 2018-02-18: qty 1

## 2018-02-18 MED ORDER — NITROGLYCERIN 0.4 MG SL SUBL: 0.4000 mg | SUBLINGUAL_TABLET | SUBLINGUAL | Status: DC | PRN

## 2018-02-18 MED ORDER — SODIUM CHLORIDE 0.9 % IV BOLUS
500.0000 mL | Freq: Once | INTRAVENOUS | Status: AC
  Administered 2018-02-18: 500 mL via INTRAVENOUS

## 2018-02-18 MED ORDER — ACETAMINOPHEN 325 MG PO TABS: 650.0000 mg | ORAL_TABLET | ORAL | Status: DC | PRN

## 2018-02-18 MED ORDER — ZOLPIDEM TARTRATE 5 MG PO TABS: 5.0000 mg | ORAL_TABLET | Freq: Every evening | ORAL | Status: DC | PRN

## 2018-02-18 MED ORDER — ALPRAZOLAM 0.25 MG PO TABS
0.2500 mg | ORAL_TABLET | Freq: Two times a day (BID) | ORAL | Status: DC | PRN
  Administered 2018-02-18 – 2018-02-23 (×5): 0.25 mg via ORAL
  Filled 2018-02-18 (×6): qty 1

## 2018-02-18 MED ORDER — PANTOPRAZOLE SODIUM 40 MG PO TBEC
40.0000 mg | DELAYED_RELEASE_TABLET | Freq: Every day | ORAL | Status: DC
  Administered 2018-02-18 – 2018-02-22 (×5): 40 mg via ORAL
  Filled 2018-02-18 (×6): qty 1

## 2018-02-18 MED ORDER — GADOBENATE DIMEGLUMINE 529 MG/ML IV SOLN
15.0000 mL | Freq: Once | INTRAVENOUS | Status: AC | PRN
  Administered 2018-02-18: 15 mL via INTRAVENOUS

## 2018-02-18 MED ORDER — ACETAMINOPHEN 325 MG PO TABS
650.0000 mg | ORAL_TABLET | ORAL | Status: DC | PRN
  Administered 2018-02-20 – 2018-02-23 (×4): 650 mg via ORAL
  Filled 2018-02-18 (×4): qty 2

## 2018-02-18 MED ORDER — CEFTRIAXONE SODIUM 2 G IJ SOLR
2.0000 g | INTRAMUSCULAR | Status: DC
  Administered 2018-02-18 – 2018-02-19 (×2): 2 g via INTRAVENOUS
  Filled 2018-02-18 (×2): qty 20

## 2018-02-18 MED ORDER — LOPERAMIDE HCL 2 MG PO CAPS
2.0000 mg | ORAL_CAPSULE | Freq: Four times a day (QID) | ORAL | Status: DC | PRN
  Filled 2018-02-18: qty 2

## 2018-02-18 MED ORDER — OXYCODONE HCL 5 MG PO TABS
5.0000 mg | ORAL_TABLET | ORAL | Status: DC | PRN
  Administered 2018-02-18: 10 mg via ORAL
  Administered 2018-02-18: 5 mg via ORAL
  Administered 2018-02-19 – 2018-02-22 (×7): 10 mg via ORAL
  Administered 2018-02-23: 5 mg via ORAL
  Filled 2018-02-18: qty 2
  Filled 2018-02-18: qty 1
  Filled 2018-02-18 (×7): qty 2
  Filled 2018-02-18: qty 1

## 2018-02-18 MED ORDER — HEPARIN (PORCINE) IN NACL 100-0.45 UNIT/ML-% IJ SOLN
1850.0000 [IU]/h | INTRAMUSCULAR | Status: DC
  Administered 2018-02-18: 1200 [IU]/h via INTRAVENOUS
  Administered 2018-02-20: 1750 [IU]/h via INTRAVENOUS
  Administered 2018-02-21 (×2): 1850 [IU]/h via INTRAVENOUS
  Filled 2018-02-18 (×9): qty 250

## 2018-02-18 MED ORDER — NICOTINE 21 MG/24HR TD PT24
21.0000 mg | MEDICATED_PATCH | Freq: Every day | TRANSDERMAL | Status: DC
  Administered 2018-02-18 – 2018-02-22 (×6): 21 mg via TRANSDERMAL
  Filled 2018-02-18 (×8): qty 1

## 2018-02-18 MED ORDER — SODIUM CHLORIDE 0.9 % IV BOLUS
1000.0000 mL | Freq: Once | INTRAVENOUS | Status: AC
  Administered 2018-02-18: 1000 mL via INTRAVENOUS

## 2018-02-18 MED ORDER — MORPHINE SULFATE (PF) 4 MG/ML IV SOLN
2.0000 mg | INTRAVENOUS | Status: DC | PRN
  Administered 2018-02-19 (×2): 2 mg via INTRAVENOUS
  Administered 2018-02-19 – 2018-02-20 (×3): 4 mg via INTRAVENOUS
  Administered 2018-02-21: 2 mg via INTRAVENOUS
  Administered 2018-02-21: 4 mg via INTRAVENOUS
  Administered 2018-02-22 – 2018-02-23 (×5): 2 mg via INTRAVENOUS
  Filled 2018-02-18 (×14): qty 1

## 2018-02-18 NOTE — ED Notes (Signed)
Pt upset stated that we keep waking him up.  Introduced myself and explained to pt I needed to collect blood.   Pt refused blood draw,  I will notify nurse.

## 2018-02-18 NOTE — ED Notes (Signed)
OK per PA pt can have something to eat. Pt request

## 2018-02-18 NOTE — Progress Notes (Signed)
ANTICOAGULATION CONSULT NOTE - Follow Up Consult  Pharmacy Consult for Heparin Indication: chest pain/ACS  Allergies  Allergen Reactions  . Benadryl [Diphenhydramine Hcl] Other (See Comments)    Aggressive behavior  . Promethazine Hcl Other (See Comments)    Unknown - given for surgical procedure and advised to never take again  . Augmentin [Amoxicillin-Pot Clavulanate] Diarrhea and Nausea Only  . Toradol [Ketorolac Tromethamine] Hives and Other (See Comments)    Redness (also) Patient states he tolerated ibuprofen fine    Patient Measurements: Height: 6' (182.9 cm) Weight: 156 lb (70.8 kg) IBW/kg (Calculated) : 77.6 Heparin Dosing Weight: 70.8 kg  Vital Signs: BP: 89/48 (04/13 1000) Pulse Rate: 85 (04/13 1000)  Labs: Recent Labs    02/15/18 1825 02/16/18 2147 02/15/2018 2100 02/18/18 0440 02/18/18 0446 02/18/18 0935  HGB 10.2* 9.7* 10.4*  --   --   --   HCT 30.8* 28.8* 30.6*  --   --   --   PLT 178 166 187  --   --   --   LABPROT  --   --  14.4  --   --   --   INR  --   --  1.13  --   --   --   HEPARINUNFRC  --   --   --   --   --  0.11*  CREATININE 1.19 1.25* 1.48*  --  1.39*  --   TROPONINI  --   --   --  0.23*  --  0.18*    Estimated Creatinine Clearance: 63 mL/min (A) (by C-G formula based on SCr of 1.39 mg/dL (H)).   Medications:  Infusions:  . 0.9 % NaCl with KCl 20 mEq / L 75 mL/hr at 02/18/18 0522  . cefTRIAXone (ROCEPHIN)  IV    . heparin 1,200 Units/hr (02/18/18 0202)  . sodium chloride 500 mL (02/18/18 1056)    Assessment: 52 year old male on IV heparin for ACS with noted fever and shortness of breath and history of endocarditis with septic embolic. ?Recent pericarditis. Troponin is bumped. CBC- low, stable. No bleeding reported.   Initial heparin level is low at 0.11 on rate of 1200 units/hr (ED provider requested no bolus)  Goal of Therapy:  Heparin level 0.3-0.7 units/ml Monitor platelets by anticoagulation protocol: Yes   Plan:  Increase  heparin to 1500 units/hr.  Recheck heparin level in 6 hours.  Daily heparin level and CBC while on therapy.   Sloan Leiter, PharmD, BCPS, BCCCP Clinical Pharmacist Clinical phone 02/18/2018 until 3:30PM [phone] After hours, please call [phone] 02/18/2018,11:20 AM

## 2018-02-18 NOTE — Progress Notes (Signed)
ANTICOAGULATION CONSULT NOTE - Initial Consult  Pharmacy Consult for Heparin Indication: chest pain/ACS  Allergies  Allergen Reactions  . Benadryl [Diphenhydramine Hcl] Other (See Comments)    Aggressive behavior  . Promethazine Hcl Other (See Comments)    Unknown - given for surgical procedure and advised to never take again  . Augmentin [Amoxicillin-Pot Clavulanate] Diarrhea and Nausea Only  . Toradol [Ketorolac Tromethamine] Hives and Other (See Comments)    Redness (also) Patient states he tolerated ibuprofen fine   Patient Measurements: Height: 6' (182.9 cm) Weight: 156 lb (70.8 kg) IBW/kg (Calculated) : 77.6  Vital Signs: Temp: 100 F (37.8 C) (04/12 2050) Temp Source: Oral (04/12 2050) BP: 95/58 (04/13 0145) Pulse Rate: 90 (04/13 0145)  Labs: Recent Labs    02/15/18 1825 02/16/18 2147 02/13/2018 2100  HGB 10.2* 9.7* 10.4*  HCT 30.8* 28.8* 30.6*  PLT 178 166 187  LABPROT  --   --  14.4  INR  --   --  1.13  CREATININE 1.19 1.25* 1.48*    Estimated Creatinine Clearance: 59.1 mL/min (A) (by C-G formula based on SCr of 1.48 mg/dL (H)).   Medical History: Past Medical History:  Diagnosis Date  . ADD (attention deficit disorder)   . Anxiety   . Chronic chest pain   . Chronic lower back pain   . Club foot   . COPD (chronic obstructive pulmonary disease) ([place])   . Coronary artery disease    a. s/p multiple prior PCIs (LAD, LCx, OM1 - [place]; [place]). b. Multiple prior caths, last in 12/2016 - medical therapy, no new lesions, nonobstructive.  . Endocarditis 2013  . GERD (gastroesophageal reflux disease)   . History of echocardiogram    Echo 11/09/16: mild LVH, EF 55-60, no RWMA, Gr 1 DD, mildly dilated aortic root (38 mm)  . Hyperlipidemia   . Hypertension   . IV drug abuse ([place])   . Myocardial infarction [facility]) 2000's - ~ 04/2015   "I've had a total of 3" (11/08/2016)  . PAF (paroxysmal atrial fibrillation) (HCC)    on Eliquis  . PTSD (post-traumatic stress  disorder)   . Pulmonary embolism ([place])    "I've had 2 or 3" (11/08/2016)  . 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 Septic pulmonary embolism ([place]) 2013  . Squamous cell carcinoma of scalp   . Stroke [facility]) ~ 2005-2006 X 2   denies residual on 11/08/2016    Assessment: 52 y/o M presents to the ED with fever, shortness of breath. Hx of endocarditis with septic emboli. Troponin mildly elevated. ?Recent pericarditis. Starting heparin. Hgb 10.4. Noted bump in Scr. PTA meds reviewed.   Goal of Therapy:  Heparin level 0.3-0.7 units/ml Monitor platelets by anticoagulation protocol: Yes   Plan:  -EDP request no bolus -Start heparin drip at 1200 units/hr -1000 HL -Daily CBC/HL -Monitor for bleeding  Narda Bonds 02/18/2018,1:54 AM

## 2018-02-18 NOTE — H&P (Signed)
History and Physical    Einer Meals FUX:[id number] DOB: 15-Oct-1966 DOA: 02/09/2018  PCP: Patient, No Pcp Per   Patient coming from: Home  Chief Complaint: Fevers, DOE   HPI: Cabe Lashley is a 52 y.o. male with medical history significant for IV drug use, coronary artery disease with stents, chronic pain, and anxiety, now presenting to the emergency department for evaluation of fevers and exertional dyspnea.  Patient reports that he has been having fevers going back a few months with blood cultures growing strep viridans.  Management of this has been complicated by patient leaving the hospital AMA and refusing many of the recommended therapies.  He was seen in the emergency department 2 days ago, had blood cultures drawn at that time which are growing Streptococcus viridans, but he left AMA at that time despite ED physician strongly recommending admission.  He continues to have fevers and general malaise, and now reports worsening exertional dyspnea, noting that he becomes very short of breath with minimal exertion, such as putting his prosthetic leg on.  He denies chest pain.  Denies any significant cough, denies urinary symptoms, and denies abdominal pain.  He has not been on antibiotics recently.  ED Course: Upon arrival to the ED, patient is found to have temp of 37.8 C, blood pressure 80/37, and heart rate of 125.  EKG features a sinus tachycardia with rate 124 and ST-T abnormalities.  Chest x-ray is negative for edema or consolidation.  Chemistry panel is notable for mild hyponatremia, hypokalemia, and creatinine of 1.48, up from 1.2 earlier this month.  CBC is notable for leukocytosis to 10,700 and a stable normocytic anemia with hemoglobin of 10.4.  Lactic acid is elevated to 5.17 and troponin is elevated to 0.26.  CTA chest is negative for PE, notable for centrilobular emphysema and a stable 7 mm nodule in the left apex.  Patient was given 30 cc/kg normal saline bolus and Rocephin 2 g IV.  He  refused vancomycin.  Blood pressure remained low and he was given an additional liter.  Cardiology was consulted by the ED physician and recommended starting heparin infusion and having internal medicine admit.  Blood pressure is improving after the additional liter of fluid, he is not in respiratory distress at this time, and will be admitted to the stepdown unit for ongoing evaluation and management of sepsis, suspected secondary to endocarditis, and exertional dyspnea with elevated troponin.  Review of Systems:  All other systems reviewed and apart from HPI, are negative.  Past Medical History:  Diagnosis Date  . ADD (attention deficit disorder)   . Anxiety   . Chronic chest pain   . Chronic lower back pain   . Club foot   . COPD (chronic obstructive pulmonary disease) ([place])   . Coronary artery disease    a. s/p multiple prior PCIs (LAD, LCx, OM1 - [place]; [place]). b. Multiple prior caths, last in 12/2016 - medical therapy, no new lesions, nonobstructive.  . Endocarditis 2013  . GERD (gastroesophageal reflux disease)   . History of echocardiogram    Echo 11/09/16: mild LVH, EF 55-60, no RWMA, Gr 1 DD, mildly dilated aortic root (38 mm)  . Hyperlipidemia   . Hypertension   . IV drug abuse ([place])   . Myocardial infarction [facility]) 2000's - ~ 04/2015   "I've had a total of 3" (11/08/2016)  . PAF (paroxysmal atrial fibrillation) ([facility])    on Eliquis  . PTSD (post-traumatic stress disorder)   . Pulmonary embolism ([place])    "  I've had 2 or 3" (11/08/2016)  . 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 Septic pulmonary embolism ([place]) 2013  . Squamous cell carcinoma of scalp   . Stroke [facility]) ~ 2005-2006 X 2   denies residual on 11/08/2016    Past Surgical History:  Procedure Laterality Date  . ABOVE KNEE LEG AMPUTATION Left   . CARDIAC CATHETERIZATION  "several"  . CARDIAC CATHETERIZATION N/A 11/26/2015   Procedure: Left Heart Cath and Coronary Angiography;  Surgeon:  Burnell Blanks, MD;  Location: [place] [facility];  Service: Cardiovascular;  Laterality: N/A;  . CLUB FOOT RELEASE Left 06/03/1967  . CORONARY ANGIOPLASTY  "several"  . CORONARY ANGIOPLASTY WITH STENT PLACEMENT  "several"   "total of 10 stents placed" (11/18/2015)  . LAPAROSCOPIC CHOLECYSTECTOMY    . LEFT HEART CATH AND CORONARY ANGIOGRAPHY N/A 12/20/2016   Procedure: Left Heart Cath and Coronary Angiography;  Surgeon: Troy Sine, MD;  Location: [place] [facility];  Service: Cardiovascular;  Laterality: N/A;  . ORTHOPEDIC SURGERY  1967-06-12-~ 2007   "total of 52 on my left leg; started w/club foot released"  . TONSILLECTOMY       reports that he has been smoking cigarettes.  He has a 17.50 pack-year smoking history. He has never used smokeless tobacco. He reports that he drank alcohol. He reports that he has current or past drug history.  Allergies  Allergen Reactions  . Benadryl [Diphenhydramine Hcl] Other (See Comments)    Aggressive behavior  . Promethazine Hcl Other (See Comments)    Unknown - given for surgical procedure and advised to never take again  . Augmentin [Amoxicillin-Pot Clavulanate] Diarrhea and Nausea Only  . Toradol [Ketorolac Tromethamine] Hives and Other (See Comments)    Redness (also) Patient states he tolerated ibuprofen fine    Family History  Problem Relation Age of Onset  . Hypertension Father   . Lung cancer Father   . Heart attack Father      Prior to Admission medications   Medication Sig Start Date End Date Taking? Authorizing Provider  acetaminophen (TYLENOL) 325 MG tablet Take 2 tablets (650 mg total) by mouth every 4 (four) hours as needed for headache or mild pain. 12/20/16  Yes Isaiah Serge, NP  aspirin-acetaminophen-caffeine (EXCEDRIN MIGRAINE) 504 500 0786 MG tablet Take 2 tablets by mouth every 6 (six) hours as needed for headache.   Yes [provider]  cyclobenzaprine (FLEXERIL) 10 MG tablet Take 10 mg by mouth 3 (three)  times daily as needed for muscle spasms.  03/10/17  Yes [provider]  ibuprofen (ADVIL,MOTRIN) 200 MG tablet Take 1,400 mg by mouth daily as needed for moderate pain.   Yes [provider]  loperamide (IMODIUM A-D) 2 MG tablet Take 2-4 mg by mouth 4 (four) times daily as needed for diarrhea or loose stools.   Yes [provider]  nitroGLYCERIN (NITROSTAT) 0.4 MG SL tablet Place 1 tablet (0.4 mg total) under the tongue every 5 (five) minutes x 3 doses as needed for chest pain. 12/20/16  Yes Isaiah Serge, NP    Physical Exam: Vitals:   02/18/18 0245 02/18/18 0300 02/18/18 0313 02/18/18 0400  BP: 100/64 (!) 91/49 (!) 97/43 (!) 108/55  Pulse: 89 (!) 59 82 82  Resp: 15 19 16  (!) 21  Temp:      TempSrc:      SpO2: 99% 98% 98% 100%  Weight:      Height:  Constitutional: No acute respiratory distress, calm, pale and chronically-ill appearing Eyes: PERTLA, lids and conjunctivae normal ENMT: Mucous membranes are moist. Posterior pharynx clear of any exudate or lesions.   Neck: normal, supple, no masses, no thyromegaly Respiratory: Breath sound slightly diminished bilaterally, no wheezing, no crackles. Normal respiratory effort.   Cardiovascular: S1 & S2 heard, regular rate and rhythm. No extremity edema.  Abdomen: No distension, no tenderness, soft. Bowel sounds normal.  Musculoskeletal: no clubbing / cyanosis. Status-post left AKA.    Skin: no significant rashes, lesions, ulcers. Warm, dry, well-perfused. Neurologic: CN 2-12 grossly intact. Sensation intact. Strength 5/5 in all 4 limbs.  Psychiatric: Alert and oriented x 3. Calm.     Labs on Admission: I have personally reviewed following labs and imaging studies  CBC: Recent Labs  Lab 02/15/18 1825 02/16/18 2147 02/09/2018 2100  WBC 11.3* 7.4 10.7*  NEUTROABS 9.7* 5.6  --   HGB 10.2* 9.7* 10.4*  HCT 30.8* 28.8* 30.6*  MCV 89.3 88.1 87.2  PLT 178 166 025   Basic Metabolic Panel: Recent  Labs  Lab 02/15/18 1825 02/16/18 2147 02/24/2018 2100  NA 131* 131* 133*  K 3.7 3.5 3.4*  CL 96* 95* 97*  CO2 24 25 21*  GLUCOSE 123* 135* 136*  BUN 12 14 15   CREATININE 1.19 1.25* 1.48*  CALCIUM 10.2 10.3 10.2   GFR: Estimated Creatinine Clearance: 59.1 mL/min (A) (by C-G formula based on SCr of 1.48 mg/dL (H)). Liver Function Tests: Recent Labs  Lab 02/15/18 1825 02/16/18 2147 03/04/2018 2220  AST 23 33 31  ALT 9* 11* 12*  ALKPHOS 86 78 85  BILITOT 0.7 0.7 0.7  PROT 7.0 6.6 6.3*  ALBUMIN 2.4* 2.3* 2.3*   No results for input(s): LIPASE, AMYLASE in the last 168 hours. No results for input(s): AMMONIA in the last 168 hours. Coagulation Profile: Recent Labs  Lab 02/16/2018 2100  INR 1.13   Cardiac Enzymes: No results for input(s): CKTOTAL, CKMB, CKMBINDEX, TROPONINI in the last 168 hours. BNP (last 3 results) No results for input(s): PROBNP in the last 8760 hours. HbA1C: No results for input(s): HGBA1C in the last 72 hours. CBG: No results for input(s): GLUCAP in the last 168 hours. Lipid Profile: No results for input(s): CHOL, HDL, LDLCALC, TRIG, CHOLHDL, LDLDIRECT in the last 72 hours. Thyroid Function Tests: No results for input(s): TSH, T4TOTAL, FREET4, T3FREE, THYROIDAB in the last 72 hours. Anemia Panel: No results for input(s): VITAMINB12, FOLATE, FERRITIN, TIBC, IRON, RETICCTPCT in the last 72 hours. Urine analysis:    Component Value Date/Time   COLORURINE YELLOW 02/18/2018 0400   APPEARANCEUR CLEAR 02/18/2018 0400   LABSPEC 1.024 02/18/2018 0400   PHURINE 5.0 02/18/2018 0400   GLUCOSEU NEGATIVE 02/18/2018 0400   HGBUR LARGE (A) 02/18/2018 0400   BILIRUBINUR NEGATIVE 02/18/2018 0400   KETONESUR NEGATIVE 02/18/2018 0400   PROTEINUR NEGATIVE 02/18/2018 0400   NITRITE NEGATIVE 02/18/2018 0400   LEUKOCYTESUR SMALL (A) 02/18/2018 0400   Sepsis Labs: @LABRCNTIP (procalcitonin:4,lacticidven:4) ) Recent Results (from the past 240 hour(s))  Blood culture  (routine x 2)     Status: Abnormal (Preliminary result)   Collection Time: 02/15/18  6:25 PM  Result Value Ref Range Status   Specimen Description BLOOD RIGHT ANTECUBITAL  Final   Special Requests   Final    BOTTLES DRAWN AEROBIC AND ANAEROBIC Blood Culture adequate volume   Culture  Setup Time   Final    GRAM POSITIVE COCCI IN CHAINS AEROBIC BOTTLE ONLY CRITICAL  RESULT CALLED TO, READ BACK BY AND VERIFIED WITH: Unk Lightning RN 15:40 02/16/18 ([email or web address])    Culture (A)  Final    VIRIDANS STREPTOCOCCUS THE SIGNIFICANCE OF ISOLATING THIS ORGANISM FROM A SINGLE VENIPUNCTURE CANNOT BE PREDICTED WITHOUT FURTHER CLINICAL AND CULTURE CORRELATION. SUSCEPTIBILITIES AVAILABLE ONLY ON REQUEST. Performed at [facility], [place] [address] [place], [place] [address]    Report Status PENDING  Incomplete  Blood Culture ID Panel (Reflexed)     Status: Abnormal   Collection Time: 02/15/18  6:25 PM  Result Value Ref Range Status   Enterococcus species NOT DETECTED NOT DETECTED Final   Listeria monocytogenes NOT DETECTED NOT DETECTED Final   Staphylococcus species NOT DETECTED NOT DETECTED Final   Staphylococcus aureus NOT DETECTED NOT DETECTED Final   Streptococcus species DETECTED (A) NOT DETECTED Final    Comment: Not Enterococcus species, Streptococcus agalactiae, Streptococcus pyogenes, or Streptococcus pneumoniae. CRITICAL RESULT CALLED TO, READ BACK BY AND VERIFIED WITH: Unk Lightning RN 15:40 02/16/18 ([email or web address])    Streptococcus agalactiae NOT DETECTED NOT DETECTED Final   Streptococcus pneumoniae NOT DETECTED NOT DETECTED Final   Streptococcus pyogenes NOT DETECTED NOT DETECTED Final   Acinetobacter baumannii NOT DETECTED NOT DETECTED Final   Enterobacteriaceae species NOT DETECTED NOT DETECTED Final   Enterobacter cloacae complex NOT DETECTED NOT DETECTED Final   Escherichia coli NOT DETECTED NOT DETECTED Final   Klebsiella oxytoca NOT DETECTED NOT DETECTED Final   Klebsiella pneumoniae NOT  DETECTED NOT DETECTED Final   Proteus species NOT DETECTED NOT DETECTED Final   Serratia marcescens NOT DETECTED NOT DETECTED Final   Haemophilus influenzae NOT DETECTED NOT DETECTED Final   Neisseria meningitidis NOT DETECTED NOT DETECTED Final   Pseudomonas aeruginosa NOT DETECTED NOT DETECTED Final   Candida albicans NOT DETECTED NOT DETECTED Final   Candida glabrata NOT DETECTED NOT DETECTED Final   Candida krusei NOT DETECTED NOT DETECTED Final   Candida parapsilosis NOT DETECTED NOT DETECTED Final   Candida tropicalis NOT DETECTED NOT DETECTED Final    Comment: Performed at [facility], [place] [address] [place], [place] [address]     Radiological Exams on Admission: Dg Chest 2 View  Result Date: 02/27/2018 CLINICAL DATA:  Shortness of breath and fever EXAM: CHEST - 2 VIEW COMPARISON:  February 15, 2018 FINDINGS: There is no edema or consolidation. The heart size and pulmonary vascularity are normal. No adenopathy. There are multiple foci of coronary artery calcification. No bone lesions. IMPRESSION: Extensive coronary artery calcification.  No edema or consolidation. Electronically Signed   By: Lowella Grip III M.D.   On: 03/05/2018 21:25   Ct Angio Chest Pe W And/or Wo Contrast  Result Date: 02/10/2018 CLINICAL DATA:  Pericarditis, difficulty breathing with fevers. History of IVDA. EXAM: CT ANGIOGRAPHY CHEST WITH CONTRAST TECHNIQUE: Multidetector CT imaging of the chest was performed using the standard protocol during bolus administration of intravenous contrast. Multiplanar CT image reconstructions and MIPs were obtained to evaluate the vascular anatomy. CONTRAST:  136mL ISOVUE-370 IOPAMIDOL (ISOVUE-370) INJECTION 76% COMPARISON:  11/09/2016 CT FINDINGS: Cardiovascular: The study is of quality for the evaluation of pulmonary embolism. There are no filling defects in the central, lobar, segmental or subsegmental pulmonary artery branches to suggest acute pulmonary embolism. Great  vessels are normal in course and caliber. Top-normal heart size. No significant pericardial fluid/thickening. Mediastinum/Nodes: No discrete thyroid nodules. Unremarkable esophagus. No pathologically enlarged axillary, mediastinal or hilar lymph nodes. Lungs/Pleura: No pneumothorax. No pleural effusion. Stable 7 mm  nodular density in the medial left upper lobe, series 6/20. Centrilobular emphysema without acute pneumonic consolidation. Bibasilar dependent atelectasis. Upper abdomen: Unremarkable. Musculoskeletal:  No aggressive appearing focal osseous lesions. Review of the MIP images confirms the above findings. IMPRESSION: 1. No acute pulmonary embolus. 2. Centrilobular emphysema. 3. Stable 7 mm nodular density in the medial left lung apex. As this appears stable since 11/09/2016, additional follow-up at 18-24 months from the 11/09/2016 study is recommended in high risk patients which given patient's COPD and IVDA history, would be recommended. This recommendation follows the consensus statement: Guidelines for Management of Incidental Pulmonary Nodules Detected on CT Images: From the Fleischner Society 2017; Radiology 2017; [id number]:228-243. Emphysema (ICD10-J43.9). Electronically Signed   By: Ashley Royalty M.D.   On: 02/09/2018 23:15    EKG: Independently reviewed. Sinus tachycardia (rate 124), ST-T abnormalities.    Assessment/Plan   1. Bacteremia; sepsis   - Presents with ongoing fevers and DOE, found to be hypotensive and tachycardic with leukocytosis and lactate 5.17  - CXR without acute findings, abdominal exam benign, no urinary sxs, no wounds, no meningismus  - Pt is IV drug user with repeat blood cultures growing strep viridans  - Management of this has been complicated by pt's poor adherence to recommendations, often refusing to have IV and refusing many of the recommended medications  - He allowed blood cultures to be drawn in ED, allowed IV to be started, was treated with 30 cc/kg NS, and was  started on Rocephin, but refused vancomycin  - He remained hypotensive after 30 cc/kg NS bolus and improved after an additional liter  - Follow cultures, check echocardiogram, continue Rocephin, continue IVF hydration   2. NSTEMI  - Presents with fevers and exertional dyspnea  - Found to have troponin of 0.26  - He has known CAD with stents  - Cardiology consulted by ED physician, recommends IV heparin infusion and medical admission   - Continue cardiac monitoring, continue heparin infusion, trend troponin, repeat EKG - ASA allergy reported, BP does not allow beta-blocker   3. CKD stage II   - SCr is 1.48 on admission, slightly up from apparent baseline of ~1.2  - Renally-dose medications, avoid nephrotoxins, continue IVF hydration, repeat chem panel in am    4. Anemia  - Hgb is 10.4 on admission  - Appears to be stable and there has not been bleeding  - Follow closely while on heparin infusion     DVT prophylaxis: IV heparin infusion  Code Status: Full  Family Communication: Discussed with patient Consults called: Cardiology Admission status: Inpatient    Vianne Bulls, MD [facility] Pager [phone]  If 7PM-7AM, please contact night-coverage [email or web address] Password [facility]  02/18/2018, 4:37 AM

## 2018-02-18 NOTE — ED Notes (Signed)
Pt moved to hospital bed and all linens change. Pt resting comfortably.

## 2018-02-18 NOTE — Progress Notes (Addendum)
PHARMACY - PHYSICIAN COMMUNICATION CRITICAL VALUE ALERT - BLOOD CULTURE IDENTIFICATION (BCID)  Results for orders placed or performed during the hospital encounter of 02/08/2018  Blood Culture ID Panel (Reflexed) (Collected: 03/02/2018  9:00 PM)  Result Value Ref Range   Enterococcus species NOT DETECTED NOT DETECTED   Listeria monocytogenes NOT DETECTED NOT DETECTED   Staphylococcus species NOT DETECTED NOT DETECTED   Staphylococcus aureus NOT DETECTED NOT DETECTED   Streptococcus species DETECTED (A) NOT DETECTED   Streptococcus agalactiae NOT DETECTED NOT DETECTED   Streptococcus pneumoniae NOT DETECTED NOT DETECTED   Streptococcus pyogenes NOT DETECTED NOT DETECTED   Acinetobacter baumannii NOT DETECTED NOT DETECTED   Enterobacteriaceae species NOT DETECTED NOT DETECTED   Enterobacter cloacae complex NOT DETECTED NOT DETECTED   Escherichia coli NOT DETECTED NOT DETECTED   Klebsiella oxytoca NOT DETECTED NOT DETECTED   Klebsiella pneumoniae NOT DETECTED NOT DETECTED   Proteus species NOT DETECTED NOT DETECTED   Serratia marcescens NOT DETECTED NOT DETECTED   Haemophilus influenzae NOT DETECTED NOT DETECTED   Neisseria meningitidis NOT DETECTED NOT DETECTED   Pseudomonas aeruginosa NOT DETECTED NOT DETECTED   Candida albicans NOT DETECTED NOT DETECTED   Candida glabrata NOT DETECTED NOT DETECTED   Candida krusei NOT DETECTED NOT DETECTED   Candida parapsilosis NOT DETECTED NOT DETECTED   Candida tropicalis NOT DETECTED NOT DETECTED    Name of physician (or Provider) Contacted: Mcclung  Changes to prescribed antibiotics required: 4/12 Blood cultures positive 1/2 for streptococcus species. Patient grew Viridans strep on 4/10 and just received first dose of ceftriaxone last night. Recommend continuing on ceftriaxone- Microlab will perform susceptibilities on viridans strep.   Aaron Dodson, PharmD, BCPS PGY2 Infectious Diseases Pharmacy Resident Pager: [phone]  02/18/2018  1:04  PM    Addendum: Now 2/2 blood cultures + for gram positive cocci. Continue ceftriaxone  Aaron Dodson, PharmD, BCPS PGY2 Infectious Diseases Pharmacy Resident Pager: [phone]

## 2018-02-18 NOTE — ED Notes (Signed)
Notified and and she will notify MD.

## 2018-02-18 NOTE — ED Notes (Signed)
Dr. Myna Hidalgo notified on pt.'s elevated troponin result .

## 2018-02-18 NOTE — ED Notes (Signed)
REPAGED TRIAD  

## 2018-02-18 NOTE — ED Notes (Signed)
Unable to collect blood specimen despite multiple venipuncture attempts , Dr. Myna Hidalgo ( admitting MD) notified .

## 2018-02-18 NOTE — ED Notes (Signed)
Patient transported to MRI 

## 2018-02-18 NOTE — ED Notes (Signed)
Ordered dinner for pt.

## 2018-02-18 NOTE — Progress Notes (Signed)
ANTICOAGULATION CONSULT NOTE - Follow Up Consult  Pharmacy Consult for Heparin Indication: chest pain/ACS  Allergies  Allergen Reactions  . Benadryl [Diphenhydramine Hcl] Other (See Comments)    Aggressive behavior  . Promethazine Hcl Other (See Comments)    Unknown - given for surgical procedure and advised to never take again  . Augmentin [Amoxicillin-Pot Clavulanate] Diarrhea and Nausea Only  . Toradol [Ketorolac Tromethamine] Hives and Other (See Comments)    Redness (also) Patient states he tolerated ibuprofen fine    Patient Measurements: Height: 6' (182.9 cm) Weight: 156 lb (70.8 kg) IBW/kg (Calculated) : 77.6 Heparin Dosing Weight: 70.8 kg  Vital Signs: Temp: 98.2 F (36.8 C) (04/13 2113) BP: 95/48 (04/13 1630) Pulse Rate: 92 (04/13 1300)  Labs: Recent Labs    02/16/18 2147 02/22/2018 2100 02/18/18 0440 02/18/18 0446 02/18/18 0935 02/18/18 2106  HGB 9.7* 10.4*  --   --   --   --   HCT 28.8* 30.6*  --   --   --   --   PLT 166 187  --   --   --   --   LABPROT  --  14.4  --   --   --   --   INR  --  1.13  --   --   --   --   HEPARINUNFRC  --   --   --   --  0.11* 0.21*  CREATININE 1.25* 1.48*  --  1.39*  --   --   TROPONINI  --   --  0.23*  --  0.18*  --     Estimated Creatinine Clearance: 63 mL/min (A) (by C-G formula based on SCr of 1.39 mg/dL (H)).   Medications:  Infusions:  . cefTRIAXone (ROCEPHIN)  IV    . heparin 1,500 Units/hr (02/18/18 1153)    Assessment: 52 year old male on IV heparin for ACS with noted fever and shortness of breath and history of endocarditis with septic embolic. ?Recent pericarditis. Troponin is bumped. CBC- low, stable. No bleeding reported.   Heparin level remains subtherapeutic after most recent rate adjustment to 1500 units/hr (ED provider requested no bolus)  Goal of Therapy:  Heparin level 0.3-0.7 units/ml Monitor platelets by anticoagulation protocol: Yes   Plan:  1. Increase heparin infusion to 1700  units/hr 2. Repeat heparin level in am   Vincenza Hews, PharmD, BCPS 02/18/2018, 9:36 PM

## 2018-02-18 NOTE — ED Notes (Signed)
Pt. Attempted to void. Unsuccessful. Urinal at bedside

## 2018-02-18 NOTE — Progress Notes (Signed)
[facility] TEAM 1 - Stepdown/ICU TEAM  Aaron Dodson  JME:[id number] DOB: 1966/11/04 DOA: 02/08/2018 PCP: Patient, No Pcp Per    Brief Narrative:  52 y.o. male w/ a hx of IV drug use, CAD with stents, chronic pain, and anxiety who presented to the ED w/ fevers "for a few months" and exertional dyspnea.  Patient has been previously evaluated w/ blood cultures growing Strep viridans, but his management has been complicated by his leaving the hospital AMA.  He was seen in the ED 2 days prior to this admit, had blood cultures drawn at that time which are growing Streptococcus viridans, but left AMA again.    In the ED the patient is found to have temp of 37.8 C, blood pressure 80/37, and heart rate of 125.  Chest x-ray was negative for edema or consolidation.  Lactic acid was elevated to 5.17 and troponin was elevated to 0.26.  CTa chest was negative for PE, but notable for centrilobular emphysema and a stable 7 mm nodule in the left apex.  Patient refused Vancomycin.  Significant Events: 4/12 admit   Subjective: Pt is seen for a f/u visit.    Assessment & Plan:  Sepsis due to Strep viridans Bacteremia      NSTEMI - CAD w/ last cath Feb 2018 (diffuse mild/mod disease > med tx) troponin of 0.26 - known CAD with stents - ASA allergy reported - BP does not allow beta-blocker   CKD stage II   Cr 1.48 on admission - baseline of ~1.2   Anemia  Hgb 10.4 on admission   DVT prophylaxis: IV heparin  Code Status: FULL CODE Family Communication: no family present at time of exam  Disposition Plan:   Consultants:  Cardiology   Antimicrobials:  Rocephin 4/12 >  Objective: Blood pressure (!) 108/48, pulse 92, temperature 100 F (37.8 C), temperature source Oral, resp. rate (!) 27, height 6' (1.829 m), weight 70.8 kg (156 lb), SpO2 97 %.  Intake/Output Summary (Last 24 hours) at 02/18/2018 1418 Last data filed at 02/18/2018 1338 Gross per 24 hour  Intake 4100 ml  Output 1050 ml  Net 3050  ml   Filed Weights   02/13/2018 2040  Weight: 70.8 kg (156 lb)    Examination: Pt is seen for a f/u visit  CBC: Recent Labs  Lab 02/15/18 1825 02/16/18 2147 02/15/2018 2100  WBC 11.3* 7.4 10.7*  NEUTROABS 9.7* 5.6  --   HGB 10.2* 9.7* 10.4*  HCT 30.8* 28.8* 30.6*  MCV 89.3 88.1 87.2  PLT 178 166 229   Basic Metabolic Panel: Recent Labs  Lab 02/16/18 2147 02/11/2018 2100 02/18/18 0446  NA 131* 133* 132*  K 3.5 3.4* 3.4*  CL 95* 97* 99*  CO2 25 21* 21*  GLUCOSE 135* 136* 102*  BUN 14 15 13   CREATININE 1.25* 1.48* 1.39*  CALCIUM 10.3 10.2 9.1   GFR: Estimated Creatinine Clearance: 63 mL/min (A) (by C-G formula based on SCr of 1.39 mg/dL (H)).  Liver Function Tests: Recent Labs  Lab 02/15/18 1825 02/16/18 2147 02/24/2018 2220  AST 23 33 31  ALT 9* 11* 12*  ALKPHOS 86 78 85  BILITOT 0.7 0.7 0.7  PROT 7.0 6.6 6.3*  ALBUMIN 2.4* 2.3* 2.3*    Coagulation Profile: Recent Labs  Lab 02/08/2018 2100  INR 1.13    Cardiac Enzymes: Recent Labs  Lab 02/18/18 0440 02/18/18 0935  TROPONINI 0.23* 0.18*     Recent Results (from the past 240 hour(s))  Blood culture (routine x 2)     Status: Abnormal   Collection Time: 02/15/18  6:25 PM  Result Value Ref Range Status   Specimen Description BLOOD RIGHT ANTECUBITAL  Final   Special Requests   Final    BOTTLES DRAWN AEROBIC AND ANAEROBIC Blood Culture adequate volume   Culture  Setup Time   Final    GRAM POSITIVE COCCI IN CHAINS AEROBIC BOTTLE ONLY CRITICAL RESULT CALLED TO, READ BACK BY AND VERIFIED WITH: Unk Lightning RN 15:40 02/16/18 ([email or web address])    Culture (A)  Final    VIRIDANS STREPTOCOCCUS THE SIGNIFICANCE OF ISOLATING THIS ORGANISM FROM A SINGLE VENIPUNCTURE CANNOT BE PREDICTED WITHOUT FURTHER CLINICAL AND CULTURE CORRELATION. SUSCEPTIBILITIES AVAILABLE ONLY ON REQUEST. Performed at [facility], [place] [address] [place], [place] [address]    Report Status 02/18/2018 FINAL  Final  Blood Culture ID Panel  (Reflexed)     Status: Abnormal   Collection Time: 02/15/18  6:25 PM  Result Value Ref Range Status   Enterococcus species NOT DETECTED NOT DETECTED Final   Listeria monocytogenes NOT DETECTED NOT DETECTED Final   Staphylococcus species NOT DETECTED NOT DETECTED Final   Staphylococcus aureus NOT DETECTED NOT DETECTED Final   Streptococcus species DETECTED (A) NOT DETECTED Final    Comment: Not Enterococcus species, Streptococcus agalactiae, Streptococcus pyogenes, or Streptococcus pneumoniae. CRITICAL RESULT CALLED TO, READ BACK BY AND VERIFIED WITH: Unk Lightning RN 15:40 02/16/18 ([email or web address])    Streptococcus agalactiae NOT DETECTED NOT DETECTED Final   Streptococcus pneumoniae NOT DETECTED NOT DETECTED Final   Streptococcus pyogenes NOT DETECTED NOT DETECTED Final   Acinetobacter baumannii NOT DETECTED NOT DETECTED Final   Enterobacteriaceae species NOT DETECTED NOT DETECTED Final   Enterobacter cloacae complex NOT DETECTED NOT DETECTED Final   Escherichia coli NOT DETECTED NOT DETECTED Final   Klebsiella oxytoca NOT DETECTED NOT DETECTED Final   Klebsiella pneumoniae NOT DETECTED NOT DETECTED Final   Proteus species NOT DETECTED NOT DETECTED Final   Serratia marcescens NOT DETECTED NOT DETECTED Final   Haemophilus influenzae NOT DETECTED NOT DETECTED Final   Neisseria meningitidis NOT DETECTED NOT DETECTED Final   Pseudomonas aeruginosa NOT DETECTED NOT DETECTED Final   Candida albicans NOT DETECTED NOT DETECTED Final   Candida glabrata NOT DETECTED NOT DETECTED Final   Candida krusei NOT DETECTED NOT DETECTED Final   Candida parapsilosis NOT DETECTED NOT DETECTED Final   Candida tropicalis NOT DETECTED NOT DETECTED Final    Comment: Performed at [facility], [place] [address] [place], [place] [address]  Culture, blood (Routine x 2)     Status: None (Preliminary result)   Collection Time: 02/14/2018  9:00 PM  Result Value Ref Range Status   Specimen Description BLOOD RIGHT  ANTECUBITAL  Final   Special Requests   Final    BOTTLES DRAWN AEROBIC AND ANAEROBIC Blood Culture adequate volume   Culture  Setup Time   Final    GRAM POSITIVE COCCI IN CHAINS ANAEROBIC BOTTLE ONLY CRITICAL RESULT CALLED TO, READ BACK BY AND VERIFIED WITH: E SINCLAIR,PHARMD AT 1258 02/18/18 BY L BENFIELD Performed at [facility], [place] [address] [place],  [address]    Culture [facility] POSITIVE COCCI  Final   Report Status PENDING  Incomplete  Blood Culture ID Panel (Reflexed)     Status: Abnormal   Collection Time: 02/18/2018  9:00 PM  Result Value Ref Range Status   Enterococcus species NOT DETECTED NOT DETECTED Final  Listeria monocytogenes NOT DETECTED NOT DETECTED Final   Staphylococcus species NOT DETECTED NOT DETECTED Final   Staphylococcus aureus NOT DETECTED NOT DETECTED Final   Streptococcus species DETECTED (A) NOT DETECTED Final    Comment: Not Enterococcus species, Streptococcus agalactiae, Streptococcus pyogenes, or Streptococcus pneumoniae. CRITICAL RESULT CALLED TO, READ BACK BY AND VERIFIED WITH: E SINCLAIR,PHARMD AT 1258 02/18/18 BY L BENFIELD    Streptococcus agalactiae NOT DETECTED NOT DETECTED Final   Streptococcus pneumoniae NOT DETECTED NOT DETECTED Final   Streptococcus pyogenes NOT DETECTED NOT DETECTED Final   Acinetobacter baumannii NOT DETECTED NOT DETECTED Final   Enterobacteriaceae species NOT DETECTED NOT DETECTED Final   Enterobacter cloacae complex NOT DETECTED NOT DETECTED Final   Escherichia coli NOT DETECTED NOT DETECTED Final   Klebsiella oxytoca NOT DETECTED NOT DETECTED Final   Klebsiella pneumoniae NOT DETECTED NOT DETECTED Final   Proteus species NOT DETECTED NOT DETECTED Final   Serratia marcescens NOT DETECTED NOT DETECTED Final   Haemophilus influenzae NOT DETECTED NOT DETECTED Final   Neisseria meningitidis NOT DETECTED NOT DETECTED Final   Pseudomonas aeruginosa NOT DETECTED NOT DETECTED Final   Candida albicans NOT DETECTED  NOT DETECTED Final   Candida glabrata NOT DETECTED NOT DETECTED Final   Candida krusei NOT DETECTED NOT DETECTED Final   Candida parapsilosis NOT DETECTED NOT DETECTED Final   Candida tropicalis NOT DETECTED NOT DETECTED Final    Comment: Performed at [facility], [place] [address], [place], [place] [address]  Culture, blood (Routine x 2)     Status: None (Preliminary result)   Collection Time: 02/20/2018 10:19 PM  Result Value Ref Range Status   Specimen Description BLOOD RIGHT FOREARM  Final   Special Requests   Final    BOTTLES DRAWN AEROBIC AND ANAEROBIC Blood Culture adequate volume   Culture  Setup Time   Final    GRAM POSITIVE COCCI IN CHAINS ANAEROBIC BOTTLE ONLY CRITICAL RESULT CALLED TO, READ BACK BY AND VERIFIED WITH: E. SINCLAIR PHARMD, AT 1339 02/18/18 BY D. VANHOOK Performed at [facility], [place] [address], [place], [place] [address]    Culture PENDING  Incomplete   Report Status PENDING  Incomplete     Scheduled Meds: . nicotine  21 mg Transdermal Daily  . pantoprazole  40 mg Oral Daily     LOS: 0 days   Cherene Altes, MD [facility] Office  [phone] Pager - Text Page per Amion as per below:  On-Call/Text Page:      [email or web address]      password [email or web address]  If 7PM-7AM, please contact night-coverage [email or web address] Password [email or web address] 02/18/2018, 2:18 PM

## 2018-02-18 NOTE — ED Notes (Signed)
MD paged regarding pts BP of 81/35

## 2018-02-19 ENCOUNTER — Telehealth: Payer: Self-pay

## 2018-02-19 ENCOUNTER — Inpatient Hospital Stay (HOSPITAL_COMMUNITY): Payer: Medicare HMO

## 2018-02-19 DIAGNOSIS — I340 Nonrheumatic mitral (valve) insufficiency: Secondary | ICD-10-CM

## 2018-02-19 LAB — CBC
HEMATOCRIT: 25.7 % — AB (ref 39.0–52.0)
HEMOGLOBIN: 8.7 g/dL — AB (ref 13.0–17.0)
MCH: 29.9 pg (ref 26.0–34.0)
MCHC: 33.9 g/dL (ref 30.0–36.0)
MCV: 88.3 fL (ref 78.0–100.0)
Platelets: 186 10*3/uL (ref 150–400)
RBC: 2.91 MIL/uL — ABNORMAL LOW (ref 4.22–5.81)
RDW: 16.1 % — AB (ref 11.5–15.5)
WBC: 8.7 10*3/uL (ref 4.0–10.5)

## 2018-02-19 LAB — COMPREHENSIVE METABOLIC PANEL
ALBUMIN: 1.9 g/dL — AB (ref 3.5–5.0)
ALK PHOS: 62 U/L (ref 38–126)
ALT: 8 U/L — ABNORMAL LOW (ref 17–63)
ANION GAP: 9 (ref 5–15)
AST: 18 U/L (ref 15–41)
BILIRUBIN TOTAL: 0.5 mg/dL (ref 0.3–1.2)
BUN: 6 mg/dL (ref 6–20)
CALCIUM: 9.1 mg/dL (ref 8.9–10.3)
CO2: 20 mmol/L — AB (ref 22–32)
Chloride: 108 mmol/L (ref 101–111)
Creatinine, Ser: 1.13 mg/dL (ref 0.61–1.24)
GFR calc Af Amer: >60 mL/min (ref >60)
GFR calc non Af Amer: >60 mL/min (ref >60)
GLUCOSE: 92 mg/dL (ref 65–99)
POTASSIUM: 3.4 mmol/L — AB (ref 3.5–5.1)
SODIUM: 137 mmol/L (ref 135–145)
TOTAL PROTEIN: 5.7 g/dL — AB (ref 6.5–8.1)

## 2018-02-19 LAB — ECHOCARDIOGRAM COMPLETE
HEIGHTINCHES: 72 in
WEIGHTICAEL: 2496 [oz_av]

## 2018-02-19 LAB — LACTIC ACID, PLASMA: Lactic Acid, Venous: 1.5 mmol/L (ref 0.5–1.9)

## 2018-02-19 LAB — TROPONIN I: Troponin I: 0.25 ng/mL (ref <0.03)

## 2018-02-19 LAB — HEPARIN LEVEL (UNFRACTIONATED): Heparin Unfractionated: 0.32 IU/mL (ref 0.30–0.70)

## 2018-02-19 NOTE — Progress Notes (Signed)
ANTICOAGULATION CONSULT NOTE - Follow Up Consult  Pharmacy Consult for Heparin Indication: chest pain/ACS  Allergies  Allergen Reactions  . Benadryl [Diphenhydramine Hcl] Other (See Comments)    Aggressive behavior  . Promethazine Hcl Other (See Comments)    Unknown - given for surgical procedure and advised to never take again  . Augmentin [Amoxicillin-Pot Clavulanate] Diarrhea and Nausea Only  . Toradol [Ketorolac Tromethamine] Hives and Other (See Comments)    Redness (also) Patient states he tolerated ibuprofen fine    Patient Measurements: Height: 6' (182.9 cm) Weight: 156 lb (70.8 kg) IBW/kg (Calculated) : 77.6 Heparin Dosing Weight: 70.8 kg  Vital Signs: Temp: 99.1 F (37.3 C) (04/14 0804) BP: 108/40 (04/14 0839) Pulse Rate: 102 (04/14 0839)  Labs: Recent Labs    02/16/18 2147 02/20/2018 2100  02/18/18 0446 02/18/18 0935 02/18/18 2105 02/18/18 2106 02/19/18 0439  HGB 9.7* 10.4*  --   --   --   --   --  8.7*  HCT 28.8* 30.6*  --   --   --   --   --  25.7*  PLT 166 187  --   --   --   --   --  186  LABPROT  --  14.4  --   --   --   --   --   --   INR  --  1.13  --   --   --   --   --   --   HEPARINUNFRC  --   --   --   --  0.11*  --  0.21* 0.32  CREATININE 1.25* 1.48*  --  1.39*  --   --   --  1.13  TROPONINI  --   --    < >  --  0.18* 0.16*  --  0.25*   < > = values in this interval not displayed.    Estimated Creatinine Clearance: 77.4 mL/min (by C-G formula based on SCr of 1.13 mg/dL).   Medications:  Infusions:  . cefTRIAXone (ROCEPHIN)  IV Stopped (02/18/18 2325)  . heparin 1,700 Units/hr (02/18/18 2255)    Assessment: 52 year old male on IV heparin for ACS with noted fever and shortness of breath and history of endocarditis with septic embolic. ?Recent pericarditis. Troponin is bumped. CBC- low, stable. No bleeding reported.   Heparin level is therapeutic (0.32) this morning on 1700 units/hr   Goal of Therapy:  Heparin level 0.3-0.7  units/ml Monitor platelets by anticoagulation protocol: Yes   Plan:  1. Increase heparin infusion slightly to 1750 units/hr 2. Repeat heparin level in am   Maryanna Shape, PharmD, BCPS  Clinical Pharmacist  Pager: [phone]   02/19/2018, 12:30 PM

## 2018-02-19 NOTE — Telephone Encounter (Signed)
Pt with + BC currently adm on 5W25 and treatment appropriate per Rebeca Alert D

## 2018-02-19 NOTE — Progress Notes (Signed)
Aaron Dodson 52 y o M patient received from ED via bed. Patient is alert and oriented x4. Patient is on step down care. Patient has bed side monitor and iv in place. Vital signs are stable. Patient given instruction about call bell and phone. Bed in low position and side rail up x2. Call bell in reach.

## 2018-02-19 NOTE — Progress Notes (Signed)
  Echocardiogram 2D Echocardiogram has been performed.  Aaron Dodson 02/19/2018, 2:34 PM

## 2018-02-19 NOTE — Plan of Care (Signed)
Progressing

## 2018-02-19 NOTE — Progress Notes (Signed)
[facility] TEAM 1 - Stepdown/ICU TEAM  Merle Cirelli  IOE:[id number] DOB: [birth year] DOA: 03/02/2018 PCP: Patient, No Pcp Per    Brief Narrative:  52 y.o. male w/ a remote hx of IV drug use (no use in years per pt), prior tx for endocarditis, CAD with stents, chronic pain, and anxiety who presented to the ED w/ fevers "for a few months" and exertional dyspnea.  Patient had been previously evaluated w/ blood cultures growing Strep viridans, but his management was complicated by his leaving the ED AMA.    In the ED this admit the patient was found to have temp of 37.8 C, blood pressure 80/37, and heart rate of 125.  Chest x-ray was negative for edema or consolidation.  Lactic acid was elevated to 5.17 and troponin was elevated to 0.26.  CTa chest was negative for PE, but notable for centrilobular emphysema and a stable 7 mm nodule in the left apex.  Patient refused Vancomycin.  Significant Events: 4/12 admit  4/14 TTE - EF 55-60% - w/ basal inferior akinesis - large vegetation of the AoV w/ mild AoS and severe regurg - normal RV size and fxn   Subjective: The pt c/o severe dyspnea on exertion.  He denies cp or sob at rest.  He c/o ongoing chronic low back pain.    Assessment & Plan:  Severe Sepsis due to Strep viridans Bacteremia  - Endocarditis of AoV w/ severe regurg  2 of 2 blood cx + 4/12 - despite back pain complaints MRI of full spine is w/o evidence of discitis, osteo, or abscess - lactate has normalized - TTE has noted a large AoV vegetation w/ resultant severe regurg - last TTE Jan 2018 noted only calcified leaflets (but valve was poorly visualized) - discussed severity of this finding w/ the pt at length - will consult Cards in AM for TEE to better eval, but suspect valve surgery will be unavoidable - will also ask ID to get involved 4/15 - advised pt not to attempt to get out of bed w/o help as he will be at high risk for hypotension and syncope/fall   NSTEMI - CAD w/ last cath Feb 2018  (diffuse mild/mod disease > med tx) troponin of 0.26 - known CAD with stents - ASA allergy reported - BP does not allow beta-blocker - troponin is waxing and waning - no SSCP - suspect this may be related to AoV disease and ?impaired perfusion to coronaries - keep anticoagulated for now and ask Cards to see in AM  CKD stage II   Cr 1.48 on admission - baseline of ~1.2 - crt now better than baseline s/p volume expansion   Anemia  Hgb 10.4 on admission - Hgb has fallen w/ volume expansion, but there is no evidence of blood loss   Remote hx of IV drug abuse Pt denies use "in years" - UDS not obtained at presentation, and pt was given narcotics after arrival therefore checking now would not be helpful   DVT prophylaxis: IV heparin  Code Status: FULL CODE Family Communication: no family present at time of exam  Disposition Plan: stable at present but at risk for abrubt decompensation   Consultants:  Cardiology   Antimicrobials:  Rocephin 4/12 >  Objective: Blood pressure (!) 108/40, pulse (!) 102, temperature 99.1 F (37.3 C), resp. rate 19, height 6' (1.829 m), weight 70.8 kg (156 lb), SpO2 96 %.  Intake/Output Summary (Last 24 hours) at 02/19/2018 1640 Last data filed at  02/19/2018 0847 Gross per 24 hour  Intake 819 ml  Output 301 ml  Net 518 ml   Filed Weights   02/13/2018 2040  Weight: 70.8 kg (156 lb)    Examination: General: No acute respiratory distress - A&Ox4 Lungs: Clear to auscultation bilaterally without wheezes or crackles Cardiovascular: Regular rate and rhythm with frequent ectopic beats - ?diastolic rumbling M  Abdomen: Nontender, nondistended, soft, bowel sounds positive, no rebound, no ascites, no appreciable mass Extremities: No significant cyanosis, clubbing, or edema lower extremity  CBC: Recent Labs  Lab 02/15/18 1825 02/16/18 2147 02/19/2018 2100 02/19/18 0439  WBC 11.3* 7.4 10.7* 8.7  NEUTROABS 9.7* 5.6  --   --   HGB 10.2* 9.7* 10.4* 8.7*  HCT  30.8* 28.8* 30.6* 25.7*  MCV 89.3 88.1 87.2 88.3  PLT 178 166 187 101   Basic Metabolic Panel: Recent Labs  Lab 02/16/2018 2100 02/18/18 0446 02/19/18 0439  NA 133* 132* 137  K 3.4* 3.4* 3.4*  CL 97* 99* 108  CO2 21* 21* 20*  GLUCOSE 136* 102* 92  BUN 15 13 6   CREATININE 1.48* 1.39* 1.13  CALCIUM 10.2 9.1 9.1   GFR: Estimated Creatinine Clearance: 77.4 mL/min (by C-G formula based on SCr of 1.13 mg/dL).  Liver Function Tests: Recent Labs  Lab 02/15/18 1825 02/16/18 2147 02/19/2018 2220 02/19/18 0439  AST 23 33 31 18  ALT 9* 11* 12* 8*  ALKPHOS 86 78 85 62  BILITOT 0.7 0.7 0.7 0.5  PROT 7.0 6.6 6.3* 5.7*  ALBUMIN 2.4* 2.3* 2.3* 1.9*    Coagulation Profile: Recent Labs  Lab 02/07/2018 2100  INR 1.13    Cardiac Enzymes: Recent Labs  Lab 02/18/18 0440 02/18/18 0935 02/18/18 2105 02/19/18 0439  TROPONINI 0.23* 0.18* 0.16* 0.25*     Recent Results (from the past 240 hour(s))  Blood culture (routine x 2)     Status: Abnormal   Collection Time: 02/15/18  6:25 PM  Result Value Ref Range Status   Specimen Description BLOOD RIGHT ANTECUBITAL  Final   Special Requests   Final    BOTTLES DRAWN AEROBIC AND ANAEROBIC Blood Culture adequate volume   Culture  Setup Time   Final    GRAM POSITIVE COCCI IN CHAINS AEROBIC BOTTLE ONLY CRITICAL RESULT CALLED TO, READ BACK BY AND VERIFIED WITH: Unk Lightning RN 15:40 02/16/18 ([email or web address])    Culture (A)  Final    VIRIDANS STREPTOCOCCUS THE SIGNIFICANCE OF ISOLATING THIS ORGANISM FROM A SINGLE VENIPUNCTURE CANNOT BE PREDICTED WITHOUT FURTHER CLINICAL AND CULTURE CORRELATION. SUSCEPTIBILITIES AVAILABLE ONLY ON REQUEST. Performed at [facility], [place] [address] [place], [place] [address]    Report Status 02/18/2018 FINAL  Final  Blood Culture ID Panel (Reflexed)     Status: Abnormal   Collection Time: 02/15/18  6:25 PM  Result Value Ref Range Status   Enterococcus species NOT DETECTED NOT DETECTED Final   Listeria  monocytogenes NOT DETECTED NOT DETECTED Final   Staphylococcus species NOT DETECTED NOT DETECTED Final   Staphylococcus aureus NOT DETECTED NOT DETECTED Final   Streptococcus species DETECTED (A) NOT DETECTED Final    Comment: Not Enterococcus species, Streptococcus agalactiae, Streptococcus pyogenes, or Streptococcus pneumoniae. CRITICAL RESULT CALLED TO, READ BACK BY AND VERIFIED WITH: Unk Lightning RN 15:40 02/16/18 ([email or web address])    Streptococcus agalactiae NOT DETECTED NOT DETECTED Final   Streptococcus pneumoniae NOT DETECTED NOT DETECTED Final   Streptococcus pyogenes NOT DETECTED NOT DETECTED Final   Acinetobacter baumannii NOT DETECTED  NOT DETECTED Final   Enterobacteriaceae species NOT DETECTED NOT DETECTED Final   Enterobacter cloacae complex NOT DETECTED NOT DETECTED Final   Escherichia coli NOT DETECTED NOT DETECTED Final   Klebsiella oxytoca NOT DETECTED NOT DETECTED Final   Klebsiella pneumoniae NOT DETECTED NOT DETECTED Final   Proteus species NOT DETECTED NOT DETECTED Final   Serratia marcescens NOT DETECTED NOT DETECTED Final   Haemophilus influenzae NOT DETECTED NOT DETECTED Final   Neisseria meningitidis NOT DETECTED NOT DETECTED Final   Pseudomonas aeruginosa NOT DETECTED NOT DETECTED Final   Candida albicans NOT DETECTED NOT DETECTED Final   Candida glabrata NOT DETECTED NOT DETECTED Final   Candida krusei NOT DETECTED NOT DETECTED Final   Candida parapsilosis NOT DETECTED NOT DETECTED Final   Candida tropicalis NOT DETECTED NOT DETECTED Final    Comment: Performed at [facility], [place] [address], [place], [place] [address]  Culture, blood (Routine x 2)     Status: Abnormal (Preliminary result)   Collection Time: 02/16/2018  9:00 PM  Result Value Ref Range Status   Specimen Description BLOOD RIGHT ANTECUBITAL  Final   Special Requests   Final    BOTTLES DRAWN AEROBIC AND ANAEROBIC Blood Culture adequate volume   Culture  Setup Time   Final    GRAM POSITIVE COCCI IN  CHAINS IN BOTH AEROBIC AND ANAEROBIC BOTTLES CRITICAL RESULT CALLED TO, READ BACK BY AND VERIFIED WITH: E SINCLAIR,PHARMD AT 1258 02/18/18 BY L BENFIELD Performed at [facility], [place] [address] [place], [place] [address]    Culture VIRIDANS STREPTOCOCCUS (A)  Final   Report Status PENDING  Incomplete  Blood Culture ID Panel (Reflexed)     Status: Abnormal   Collection Time: 03/06/2018  9:00 PM  Result Value Ref Range Status   Enterococcus species NOT DETECTED NOT DETECTED Final   Listeria monocytogenes NOT DETECTED NOT DETECTED Final   Staphylococcus species NOT DETECTED NOT DETECTED Final   Staphylococcus aureus NOT DETECTED NOT DETECTED Final   Streptococcus species DETECTED (A) NOT DETECTED Final    Comment: Not Enterococcus species, Streptococcus agalactiae, Streptococcus pyogenes, or Streptococcus pneumoniae. CRITICAL RESULT CALLED TO, READ BACK BY AND VERIFIED WITH: E SINCLAIR,PHARMD AT 1258 02/18/18 BY L BENFIELD    Streptococcus agalactiae NOT DETECTED NOT DETECTED Final   Streptococcus pneumoniae NOT DETECTED NOT DETECTED Final   Streptococcus pyogenes NOT DETECTED NOT DETECTED Final   Acinetobacter baumannii NOT DETECTED NOT DETECTED Final   Enterobacteriaceae species NOT DETECTED NOT DETECTED Final   Enterobacter cloacae complex NOT DETECTED NOT DETECTED Final   Escherichia coli NOT DETECTED NOT DETECTED Final   Klebsiella oxytoca NOT DETECTED NOT DETECTED Final   Klebsiella pneumoniae NOT DETECTED NOT DETECTED Final   Proteus species NOT DETECTED NOT DETECTED Final   Serratia marcescens NOT DETECTED NOT DETECTED Final   Haemophilus influenzae NOT DETECTED NOT DETECTED Final   Neisseria meningitidis NOT DETECTED NOT DETECTED Final   Pseudomonas aeruginosa NOT DETECTED NOT DETECTED Final   Candida albicans NOT DETECTED NOT DETECTED Final   Candida glabrata NOT DETECTED NOT DETECTED Final   Candida krusei NOT DETECTED NOT DETECTED Final   Candida parapsilosis NOT  DETECTED NOT DETECTED Final   Candida tropicalis NOT DETECTED NOT DETECTED Final    Comment: Performed at [facility], [place] [address] [place], [place] [address]  Culture, blood (Routine x 2)     Status: Abnormal (Preliminary result)   Collection Time: 02/08/2018 10:19 PM  Result  Value Ref Range Status   Specimen Description BLOOD RIGHT FOREARM  Final   Special Requests   Final    BOTTLES DRAWN AEROBIC AND ANAEROBIC Blood Culture adequate volume   Culture  Setup Time   Final    GRAM POSITIVE COCCI IN CHAINS IN BOTH AEROBIC AND ANAEROBIC BOTTLES CRITICAL RESULT CALLED TO, READ BACK BY AND VERIFIED WITH: E. SINCLAIR PHARMD, AT 1339 02/18/18 BY D. VANHOOK Performed at [facility], [place] [address] [place], [place] [address]    Culture VIRIDANS STREPTOCOCCUS (A)  Final   Report Status PENDING  Incomplete  MRSA PCR Screening     Status: None   Collection Time: 02/18/18  9:16 PM  Result Value Ref Range Status   MRSA by PCR NEGATIVE NEGATIVE Final    Comment:        The GeneXpert MRSA Assay (FDA approved for NASAL specimens only), is one component of a comprehensive MRSA colonization surveillance program. It is not intended to diagnose MRSA infection nor to guide or monitor treatment for MRSA infections. Performed at [facility], [place] [address], [place], [place] [address]      Scheduled Meds: . nicotine  21 mg Transdermal Daily  . pantoprazole  40 mg Oral Daily     LOS: 1 day   Cherene Altes, MD [facility] Office  [phone] Pager - Text Page per Amion as per below:  On-Call/Text Page:      [email or web address]      password [email or web address]  If 7PM-7AM, please contact night-coverage [email or web address] Password [email or web address] 02/19/2018, 4:40 PM

## 2018-02-20 ENCOUNTER — Ambulatory Visit: Payer: Self-pay | Admitting: Cardiology

## 2018-02-20 DIAGNOSIS — R251 Tremor, unspecified: Secondary | ICD-10-CM

## 2018-02-20 DIAGNOSIS — Z888 Allergy status to other drugs, medicaments and biological substances status: Secondary | ICD-10-CM

## 2018-02-20 DIAGNOSIS — B954 Other streptococcus as the cause of diseases classified elsewhere: Secondary | ICD-10-CM

## 2018-02-20 DIAGNOSIS — I080 Rheumatic disorders of both mitral and aortic valves: Secondary | ICD-10-CM

## 2018-02-20 DIAGNOSIS — I76 Septic arterial embolism: Secondary | ICD-10-CM

## 2018-02-20 DIAGNOSIS — F1111 Opioid abuse, in remission: Secondary | ICD-10-CM

## 2018-02-20 DIAGNOSIS — K08109 Complete loss of teeth, unspecified cause, unspecified class: Secondary | ICD-10-CM

## 2018-02-20 DIAGNOSIS — I330 Acute and subacute infective endocarditis: Secondary | ICD-10-CM

## 2018-02-20 DIAGNOSIS — R011 Cardiac murmur, unspecified: Secondary | ICD-10-CM

## 2018-02-20 DIAGNOSIS — F17210 Nicotine dependence, cigarettes, uncomplicated: Secondary | ICD-10-CM

## 2018-02-20 DIAGNOSIS — B955 Unspecified streptococcus as the cause of diseases classified elsewhere: Secondary | ICD-10-CM

## 2018-02-20 DIAGNOSIS — I959 Hypotension, unspecified: Secondary | ICD-10-CM

## 2018-02-20 DIAGNOSIS — A491 Streptococcal infection, unspecified site: Secondary | ICD-10-CM

## 2018-02-20 DIAGNOSIS — F1910 Other psychoactive substance abuse, uncomplicated: Secondary | ICD-10-CM

## 2018-02-20 DIAGNOSIS — F419 Anxiety disorder, unspecified: Secondary | ICD-10-CM

## 2018-02-20 DIAGNOSIS — R748 Abnormal levels of other serum enzymes: Secondary | ICD-10-CM

## 2018-02-20 DIAGNOSIS — I2510 Atherosclerotic heart disease of native coronary artery without angina pectoris: Secondary | ICD-10-CM

## 2018-02-20 DIAGNOSIS — Z89612 Acquired absence of left leg above knee: Secondary | ICD-10-CM

## 2018-02-20 DIAGNOSIS — Z881 Allergy status to other antibiotic agents status: Secondary | ICD-10-CM

## 2018-02-20 LAB — CULTURE, BLOOD (ROUTINE X 2)
SPECIAL REQUESTS: ADEQUATE
Special Requests: ADEQUATE

## 2018-02-20 LAB — BASIC METABOLIC PANEL
Anion gap: 9 (ref 5–15)
CALCIUM: 9.6 mg/dL (ref 8.9–10.3)
CHLORIDE: 107 mmol/L (ref 101–111)
CO2: 22 mmol/L (ref 22–32)
CREATININE: 1.05 mg/dL (ref 0.61–1.24)
GFR calc Af Amer: >60 mL/min (ref >60)
GFR calc non Af Amer: >60 mL/min (ref >60)
GLUCOSE: 107 mg/dL — AB (ref 65–99)
Potassium: 3.1 mmol/L — ABNORMAL LOW (ref 3.5–5.1)
Sodium: 138 mmol/L (ref 135–145)

## 2018-02-20 LAB — CBC
HEMATOCRIT: 26.4 % — AB (ref 39.0–52.0)
HEMOGLOBIN: 8.8 g/dL — AB (ref 13.0–17.0)
MCH: 29.5 pg (ref 26.0–34.0)
MCHC: 33.3 g/dL (ref 30.0–36.0)
MCV: 88.6 fL (ref 78.0–100.0)
Platelets: 222 10*3/uL (ref 150–400)
RBC: 2.98 MIL/uL — ABNORMAL LOW (ref 4.22–5.81)
RDW: 16.6 % — AB (ref 11.5–15.5)
WBC: 10.8 10*3/uL — ABNORMAL HIGH (ref 4.0–10.5)

## 2018-02-20 LAB — HEPARIN LEVEL (UNFRACTIONATED): Heparin Unfractionated: 0.3 IU/mL (ref 0.30–0.70)

## 2018-02-20 MED ORDER — ASPIRIN EC 81 MG PO TBEC
81.0000 mg | DELAYED_RELEASE_TABLET | Freq: Every day | ORAL | Status: DC
  Administered 2018-02-20 – 2018-02-22 (×3): 81 mg via ORAL
  Filled 2018-02-20 (×5): qty 1

## 2018-02-20 MED ORDER — ATORVASTATIN CALCIUM 40 MG PO TABS
40.0000 mg | ORAL_TABLET | Freq: Every day | ORAL | Status: DC
  Administered 2018-02-20 – 2018-02-22 (×3): 40 mg via ORAL
  Filled 2018-02-20 (×2): qty 1

## 2018-02-20 MED ORDER — PENICILLIN G POTASSIUM 5000000 UNITS IJ SOLR
4.0000 10*6.[IU] | INTRAMUSCULAR | Status: DC
  Administered 2018-02-20 – 2018-02-23 (×14): 4 10*6.[IU] via INTRAVENOUS
  Filled 2018-02-20 (×22): qty 4

## 2018-02-20 MED ORDER — POTASSIUM CHLORIDE CRYS ER 20 MEQ PO TBCR
40.0000 meq | EXTENDED_RELEASE_TABLET | Freq: Once | ORAL | Status: AC
  Administered 2018-02-20: 40 meq via ORAL
  Filled 2018-02-20: qty 2

## 2018-02-20 MED ORDER — ENSURE ENLIVE PO LIQD: 237.0000 mL | Freq: Two times a day (BID) | ORAL | Status: DC

## 2018-02-20 NOTE — Progress Notes (Signed)
ANTICOAGULATION CONSULT NOTE - Follow Up Consult  Pharmacy Consult for Heparin Indication: chest pain/ACS  Allergies  Allergen Reactions  . Benadryl [Diphenhydramine Hcl] Other (See Comments)    Aggressive behavior  . Promethazine Hcl Other (See Comments)    Unknown - given for surgical procedure and advised to never take again  . Augmentin [Amoxicillin-Pot Clavulanate] Diarrhea and Nausea Only  . Toradol [Ketorolac Tromethamine] Hives and Other (See Comments)    Redness (also) Patient states he tolerated ibuprofen fine    Patient Measurements: Height: 6' (182.9 cm) Weight: 150 lb 3.2 oz (68.1 kg) IBW/kg (Calculated) : 77.6 Heparin Dosing Weight:  68.1 kg  Vital Signs: Temp: 99 F (37.2 C) (04/15 0446) Temp Source: Oral (04/15 0446) BP: 112/54 (04/15 0446) Pulse Rate: 109 (04/15 0446)  Labs: Recent Labs    02/13/2018 2100  02/18/18 0446  02/18/18 0935 02/18/18 2105 02/18/18 2106 02/19/18 0439 02/20/18 0505  HGB 10.4*  --   --   --   --   --   --  8.7* 8.8*  HCT 30.6*  --   --   --   --   --   --  25.7* 26.4*  PLT 187  --   --   --   --   --   --  186 222  LABPROT 14.4  --   --   --   --   --   --   --   --   INR 1.13  --   --   --   --   --   --   --   --   HEPARINUNFRC  --   --   --    < > 0.11*  --  0.21* 0.32 0.30  CREATININE 1.48*  --  1.39*  --   --   --   --  1.13 1.05  TROPONINI  --    < >  --   --  0.18* 0.16*  --  0.25*  --    < > = values in this interval not displayed.    Estimated Creatinine Clearance: 80.2 mL/min (by C-G formula based on SCr of 1.05 mg/dL).  Assessment:  Anticoag: Heparin for ACS (with IE), +troponins - no bolus per ED provider.  Heparin level 0.3. Hgb 8.8 low but stable. Plts 222 ok.   Goal of Therapy:  Heparin level 0.3-0.7 units/ml Monitor platelets by anticoagulation protocol: Yes   Plan:  Increase heparin to 1850 units/hr to prevent further drop in level F/u AM heparin level and CBC   Aaron Dodson, PharmD,  BC[facility] Clinical Staff Pharmacist Pager [phone]  Aaron Dodson 02/20/2018,8:30 AM

## 2018-02-20 NOTE — Progress Notes (Signed)
[facility]                                                                              Patient Demographics  Aaron Dodson, is a 52 y.o. male, DOB - 01-Apr-1966, BPZ:[id number]  Admit date - 02/24/2018   Admitting Physician Vianne Bulls, MD  Outpatient Primary MD for the patient is Patient, No Pcp Per  Outpatient specialists:   LOS - 2  days   Medical records reviewed and are as summarized below:    Chief Complaint  Patient presents with  . Chest Pain       Brief summary   52 y.o.malew/ a remote hx ofIV drug use (no use in years per pt), prior tx for endocarditis, CAD with stents, chronic pain, and anxiety who presented to the ED w/ fevers "for a few months" and exertional dyspnea. Patient had been previously evaluated w/ blood cultures growing Strep viridans, but his management was complicated by his leaving the ED AMA on 4/10.  In the ED this admit the patient was found to have temp of 100 F,bp 80/37, and heart rate of 125. Chest x-ray was negative for edema or consolidation. Lactic acid was elevated to 5.17 and troponin was elevated to 0.26. CTA chest was negative for PE, but notable for centrilobular emphysema and a stable 7 mm nodule in the left apex.   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 Tommy Medal   Active Problems: Elevated troponins with history of CAD  -Patient had last cardiac cath February 2018 which showed diffuse  mild to moderate disease and conservative management recommended -Troponins trending up, 0.25, cardiology consulted, currently on heparin drip  Hypokalemia -Replaced     Pain syndrome, chronic -Currently on oxycodone IR, morphine IV as needed -MRI cervical, thoracic, lumbar spine showed no discitis, osteomyelitis or abscess.    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 nicotine  21 mg Transdermal Daily  . pantoprazole  40 mg Oral Daily   Continuous Infusions: . cefTRIAXone (ROCEPHIN)  IV Stopped (02/19/18 2135)  . heparin 1,850 Units/hr (02/20/18 0851)   PRN Meds:.acetaminophen, ALPRAZolam, cyclobenzaprine, loperamide, morphine injection, nitroGLYCERIN, ondansetron (ZOFRAN) IV, oxyCODONE, zolpidem   Antibiotics   Anti-infectives (From admission, onward)   Start     Dose/Rate Route Frequency Ordered Stop   02/18/18 2200  cefTRIAXone (ROCEPHIN) 2 g in sodium chloride 0.9 % 100 mL IVPB     2 g 200 mL/hr over 30 Minutes Intravenous Every 24 hours 02/18/18 0439     03/06/2018 2245  cefTRIAXone (ROCEPHIN) 2 g in sodium chloride 0.9 % 100 mL IVPB     2 g 200 mL/hr over 30 Minutes Intravenous  Once 02/12/2018 2243 02/18/18 0008        Subjective:   Aaron Dodson was seen and examined today.  Feeling a lot better today, shortness of breath improving, has ongoing low back pain, 5/10.  Patient denies dizziness, chest pain, abdominal pain, N/V/D/C, new weakness, numbess, tingling. No acute events overnight.    Objective:   Vitals:   02/19/18 1322 02/19/18 1722 02/19/18 2024 02/20/18 0446  BP: (!) 98/58 124/61 (!) 117/58 (!) 112/54  Pulse: 97 (!) 101 99 (!) 109  Resp:  17 18 18   Temp:   97.8 F (36.6 C) 99 F (37.2 C)  TempSrc:   Oral Oral  SpO2: 98% 99% 98% 98%  Weight:    68.1 kg (150 lb 3.2 oz)  Height:        Intake/Output Summary (Last 24 hours) at 02/20/2018 1045 Last data filed at 02/20/2018 1013 Gross per 24 hour  Intake 1082 ml  Output 301 ml  Net 781 ml     Wt Readings from Last 3 Encounters:  02/20/18 68.1 kg (150 lb 3.2 oz)  02/16/18 70.8 kg (156 lb)  02/15/18 70.8 kg (156 lb)     Exam  General: Alert and oriented x 3, NAD  Eyes: PERRLA, EOMI, Anicteric Sclera,  HEENT:  Atraumatic, normocephalic, normal oropharynx  Cardiovascular: S1 S2 auscultated, 3/6 AI murmur, regular rate and rhythm.  Respiratory: Clear to auscultation bilaterally, no wheezing, rales or rhonchi  Gastrointestinal: Soft, nontender, nondistended, + bowel sounds  Ext: left AKA  Neuro: no new deficits  Musculoskeletal: No digital cyanosis, clubbing  Skin: No rashes  Psych: Normal affect and demeanor, alert and oriented x3    Data Reviewed:  I have personally reviewed following labs and imaging studies  Micro Results Recent Results (from the past 240 hour(s))  Blood culture (routine x 2)     Status: Abnormal   Collection Time: 02/15/18  6:25 PM  Result Value Ref Range Status   Specimen Description BLOOD RIGHT ANTECUBITAL  Final   Special Requests   Final    BOTTLES DRAWN AEROBIC AND ANAEROBIC Blood Culture adequate volume   Culture  Setup Time   Final    GRAM POSITIVE COCCI IN CHAINS AEROBIC BOTTLE ONLY CRITICAL RESULT  CALLED TO, READ BACK BY AND VERIFIED WITH: Unk Lightning RN 15:40 02/16/18 ([email or web address])    Culture (A)  Final    VIRIDANS STREPTOCOCCUS THE SIGNIFICANCE OF ISOLATING THIS ORGANISM FROM A SINGLE VENIPUNCTURE CANNOT BE PREDICTED WITHOUT FURTHER CLINICAL AND CULTURE CORRELATION. SUSCEPTIBILITIES AVAILABLE ONLY ON REQUEST. Performed at [facility], [place] [address], [place], [place] [address]    Report Status 02/18/2018 FINAL  Final  Blood Culture ID Panel (Reflexed)     Status: Abnormal   Collection  Time: 02/15/18  6:25 PM  Result Value Ref Range Status   Enterococcus species NOT DETECTED NOT DETECTED Final   Listeria monocytogenes NOT DETECTED NOT DETECTED Final   Staphylococcus species NOT DETECTED NOT DETECTED Final   Staphylococcus aureus NOT DETECTED NOT DETECTED Final   Streptococcus species DETECTED (A) NOT DETECTED Final    Comment: Not Enterococcus species, Streptococcus agalactiae, Streptococcus pyogenes, or Streptococcus pneumoniae. CRITICAL RESULT CALLED TO, READ BACK BY AND VERIFIED WITH: Unk Lightning RN 15:40 02/16/18 ([email or web address])    Streptococcus agalactiae NOT DETECTED NOT DETECTED Final   Streptococcus pneumoniae NOT DETECTED NOT DETECTED Final   Streptococcus pyogenes NOT DETECTED NOT DETECTED Final   Acinetobacter baumannii NOT DETECTED NOT DETECTED Final   Enterobacteriaceae species NOT DETECTED NOT DETECTED Final   Enterobacter cloacae complex NOT DETECTED NOT DETECTED Final   Escherichia coli NOT DETECTED NOT DETECTED Final   Klebsiella oxytoca NOT DETECTED NOT DETECTED Final   Klebsiella pneumoniae NOT DETECTED NOT DETECTED Final   Proteus species NOT DETECTED NOT DETECTED Final   Serratia marcescens NOT DETECTED NOT DETECTED Final   Haemophilus influenzae NOT DETECTED NOT DETECTED Final   Neisseria meningitidis NOT DETECTED NOT DETECTED Final   Pseudomonas aeruginosa NOT DETECTED NOT DETECTED Final   Candida albicans NOT DETECTED NOT DETECTED Final   Candida glabrata  NOT DETECTED NOT DETECTED Final   Candida krusei NOT DETECTED NOT DETECTED Final   Candida parapsilosis NOT DETECTED NOT DETECTED Final   Candida tropicalis NOT DETECTED NOT DETECTED Final    Comment: Performed at [facility], [place] [address] [place], [place] [address]  Culture, blood (Routine x 2)     Status: Abnormal   Collection Time: 02/08/2018  9:00 PM  Result Value Ref Range Status   Specimen Description BLOOD RIGHT ANTECUBITAL  Final   Special Requests   Final    BOTTLES DRAWN AEROBIC AND ANAEROBIC Blood Culture adequate volume   Culture  Setup Time   Final    GRAM POSITIVE COCCI IN CHAINS IN BOTH AEROBIC AND ANAEROBIC BOTTLES CRITICAL RESULT CALLED TO, READ BACK BY AND VERIFIED WITH: E SINCLAIR,PHARMD AT 1258 02/18/18 BY L BENFIELD Performed at [facility], [place] [address] [place],  [address]    Culture VIRIDANS STREPTOCOCCUS (A)  Final   Report Status 02/20/2018 FINAL  Final   Organism ID, Bacteria VIRIDANS STREPTOCOCCUS  Final      Susceptibility   Viridans streptococcus - MIC*    TETRACYCLINE >=16 RESISTANT Resistant     VANCOMYCIN 0.5 SENSITIVE Sensitive     CLINDAMYCIN >=1 RESISTANT Resistant     * VIRIDANS STREPTOCOCCUS  Blood Culture ID Panel (Reflexed)     Status: Abnormal   Collection Time: 02/16/2018  9:00 PM  Result Value Ref Range Status   Enterococcus species NOT DETECTED NOT DETECTED Final   Listeria monocytogenes NOT DETECTED NOT DETECTED Final   Staphylococcus species NOT DETECTED NOT DETECTED Final   Staphylococcus aureus NOT DETECTED NOT DETECTED Final   Streptococcus species DETECTED (A) NOT DETECTED Final    Comment: Not Enterococcus species, Streptococcus agalactiae, Streptococcus pyogenes, or Streptococcus pneumoniae. CRITICAL RESULT CALLED TO, READ BACK BY AND VERIFIED WITH: E SINCLAIR,PHARMD AT 1258 02/18/18 BY L BENFIELD    Streptococcus agalactiae NOT DETECTED NOT DETECTED Final   Streptococcus pneumoniae NOT DETECTED NOT  DETECTED Final   Streptococcus pyogenes NOT DETECTED NOT DETECTED Final   Acinetobacter baumannii NOT DETECTED NOT DETECTED Final   Enterobacteriaceae species NOT DETECTED  NOT DETECTED Final   Enterobacter cloacae complex NOT DETECTED NOT DETECTED Final   Escherichia coli NOT DETECTED NOT DETECTED Final   Klebsiella oxytoca NOT DETECTED NOT DETECTED Final   Klebsiella pneumoniae NOT DETECTED NOT DETECTED Final   Proteus species NOT DETECTED NOT DETECTED Final   Serratia marcescens NOT DETECTED NOT DETECTED Final   Haemophilus influenzae NOT DETECTED NOT DETECTED Final   Neisseria meningitidis NOT DETECTED NOT DETECTED Final   Pseudomonas aeruginosa NOT DETECTED NOT DETECTED Final   Candida albicans NOT DETECTED NOT DETECTED Final   Candida glabrata NOT DETECTED NOT DETECTED Final   Candida krusei NOT DETECTED NOT DETECTED Final   Candida parapsilosis NOT DETECTED NOT DETECTED Final   Candida tropicalis NOT DETECTED NOT DETECTED Final    Comment: Performed at [facility], [place] [address], [place], [place] [address]  Culture, blood (Routine x 2)     Status: Abnormal   Collection Time: 02/09/2018 10:19 PM  Result Value Ref Range Status   Specimen Description BLOOD RIGHT FOREARM  Final   Special Requests   Final    BOTTLES DRAWN AEROBIC AND ANAEROBIC Blood Culture adequate volume   Culture  Setup Time   Final    GRAM POSITIVE COCCI IN CHAINS IN BOTH AEROBIC AND ANAEROBIC BOTTLES CRITICAL RESULT CALLED TO, READ BACK BY AND VERIFIED WITH: E. SINCLAIR PHARMD, AT 1339 02/18/18 BY D. VANHOOK    Culture (A)  Final    VIRIDANS STREPTOCOCCUS SUSCEPTIBILITIES PERFORMED ON PREVIOUS CULTURE WITHIN THE LAST 5 DAYS. Performed at [facility], [place] [address], [place], [place] [address]    Report Status 02/20/2018 FINAL  Final  MRSA PCR Screening     Status: None   Collection Time: 02/18/18  9:16 PM  Result Value Ref Range Status   MRSA by PCR NEGATIVE NEGATIVE Final    Comment:          The GeneXpert MRSA Assay (FDA approved for NASAL specimens only), is one component of a comprehensive MRSA colonization surveillance program. It is not intended to diagnose MRSA infection nor to guide or monitor treatment for MRSA infections. Performed at [facility], [place] [address] [place],  [address]     Radiology Reports Dg Chest 2 View  Result Date: 02/19/2018 CLINICAL DATA:  Shortness of breath and fever EXAM: CHEST - 2 VIEW COMPARISON:  February 15, 2018 FINDINGS: There is no edema or consolidation. The heart size and pulmonary vascularity are normal. No adenopathy. There are multiple foci of coronary artery calcification. No bone lesions. IMPRESSION: Extensive coronary artery calcification.  No edema or consolidation. Electronically Signed   By: Lowella Grip III M.D.   On: 02/14/2018 21:25   Dg Chest 2 View  Result Date: 02/15/2018 CLINICAL DATA:  Productive cough x2 weeks. Low-grade fever x3 weeks. EXAM: CHEST - 2 VIEW COMPARISON:  05/05/2017 FINDINGS: The heart size and mediastinal contours are within normal limits. Coronary stent noted on the lateral view. No CHF or pulmonary consolidations. Mild emphysematous hyperinflation of the lungs, upper lobe predominant. Mild chronic interstitial prominence. The visualized skeletal structures are unremarkable. IMPRESSION: No active cardiopulmonary disease. Mild emphysematous hyperinflation of the lungs, upper lobe predominant. Electronically Signed   By: Ashley Royalty M.D.   On: 02/15/2018 19:24   Ct Angio Chest Pe W And/or Wo Contrast  Result Date: 03/03/2018 CLINICAL DATA:  Pericarditis, difficulty breathing with fevers. History of IVDA. EXAM: CT ANGIOGRAPHY CHEST WITH CONTRAST TECHNIQUE: Multidetector CT imaging  of the chest was performed using the standard protocol during bolus administration of intravenous contrast. Multiplanar CT image reconstructions and MIPs were obtained to evaluate the vascular anatomy. CONTRAST:   143mL ISOVUE-370 IOPAMIDOL (ISOVUE-370) INJECTION 76% COMPARISON:  11/09/2016 CT FINDINGS: Cardiovascular: The study is of quality for the evaluation of pulmonary embolism. There are no filling defects in the central, lobar, segmental or subsegmental pulmonary artery branches to suggest acute pulmonary embolism. Great vessels are normal in course and caliber. Top-normal heart size. No significant pericardial fluid/thickening. Mediastinum/Nodes: No discrete thyroid nodules. Unremarkable esophagus. No pathologically enlarged axillary, mediastinal or hilar lymph nodes. Lungs/Pleura: No pneumothorax. No pleural effusion. Stable 7 mm nodular density in the medial left upper lobe, series 6/20. Centrilobular emphysema without acute pneumonic consolidation. Bibasilar dependent atelectasis. Upper abdomen: Unremarkable. Musculoskeletal:  No aggressive appearing focal osseous lesions. Review of the MIP images confirms the above findings. IMPRESSION: 1. No acute pulmonary embolus. 2. Centrilobular emphysema. 3. Stable 7 mm nodular density in the medial left lung apex. As this appears stable since 11/09/2016, additional follow-up at 18-24 months from the 11/09/2016 study is recommended in high risk patients which given patient's COPD and IVDA history, would be recommended. This recommendation follows the consensus statement: Guidelines for Management of Incidental Pulmonary Nodules Detected on CT Images: From the Fleischner Society 2017; Radiology 2017; [id number]. Emphysema (ICD10-J43.9). Electronically Signed   By: Ashley Royalty M.D.   On: 02/09/2018 23:15   Mr Cervical Spine W Wo Contrast  Result Date: 02/18/2018 CLINICAL DATA:  Infective endocarditis. EXAM: MRI TOTAL SPINE WITHOUT AND WITH CONTRAST TECHNIQUE: Multisequence MR imaging of the spine from the cervical spine to the sacrum was performed prior to and following IV contrast administration. CONTRAST:  42mL MULTIHANCE GADOBENATE DIMEGLUMINE 529 MG/ML IV SOLN  COMPARISON:  None. FINDINGS: MRI CERVICAL SPINE FINDINGS Alignment: Mild C3-4 anterolisthesis. Vertebrae: No fracture, evidence of discitis, or bone lesion.Diffuse hypointense marrow likely related to patient's anemia. Cord: Normal signal and morphology. Posterior Fossa, vertebral arteries, paraspinal tissues: No convincing inflammation. Disc levels: Shallow disc protrusions at C3-4, C5-6, and C6-7. No visible cord impingement. Limited assessment of foraminal narrowing due to motion. MRI THORACIC SPINE FINDINGS Alignment:  Normal. Vertebrae: No evidence of discitis, fracture, or aggressive lesion. Diffuse hypointense marrow likely related to patient's anemia. Cord:  Normal signal and morphology. Paraspinal and other soft tissues: No visible abscess or inflammation. Disc levels: Tiny T8-9 disc protrusion.  No impingement. MRI LUMBAR SPINE FINDINGS Segmentation:  5 lumbar type vertebral bodies. Alignment:  Normal. Vertebrae: Diffusely hypointense marrow as above. No evidence of discitis, fracture, or bone lesion. Conus medullaris: Extends to the L1-2 level and appears normal. Paraspinal and other soft tissues: Nonspecific retroperitoneal and subcutaneous edema. No visible abscess or discrete inflammation. Disc levels: Good disc height and hydration.  Negative facets.  No impingement. There is pervasive motion artifact in this patient with pain. Pathologic findings could be obscured. IMPRESSION: 1. No visible discitis/osteomyelitis or abscess. No acute finding throughout the visualized spine. 2. Extensive motion degradation. Electronically Signed   By: Monte Fantasia M.D.   On: 02/18/2018 21:12   Mr Thoracic Spine W Wo Contrast  Result Date: 02/18/2018 CLINICAL DATA:  Infective endocarditis. EXAM: MRI TOTAL SPINE WITHOUT AND WITH CONTRAST TECHNIQUE: Multisequence MR imaging of the spine from the cervical spine to the sacrum was performed prior to and following IV contrast administration. CONTRAST:  65mL MULTIHANCE  GADOBENATE DIMEGLUMINE 529 MG/ML IV SOLN COMPARISON:  None. FINDINGS: MRI CERVICAL SPINE FINDINGS Alignment:  Mild C3-4 anterolisthesis. Vertebrae: No fracture, evidence of discitis, or bone lesion.Diffuse hypointense marrow likely related to patient's anemia. Cord: Normal signal and morphology. Posterior Fossa, vertebral arteries, paraspinal tissues: No convincing inflammation. Disc levels: Shallow disc protrusions at C3-4, C5-6, and C6-7. No visible cord impingement. Limited assessment of foraminal narrowing due to motion. MRI THORACIC SPINE FINDINGS Alignment:  Normal. Vertebrae: No evidence of discitis, fracture, or aggressive lesion. Diffuse hypointense marrow likely related to patient's anemia. Cord:  Normal signal and morphology. Paraspinal and other soft tissues: No visible abscess or inflammation. Disc levels: Tiny T8-9 disc protrusion.  No impingement. MRI LUMBAR SPINE FINDINGS Segmentation:  5 lumbar type vertebral bodies. Alignment:  Normal. Vertebrae: Diffusely hypointense marrow as above. No evidence of discitis, fracture, or bone lesion. Conus medullaris: Extends to the L1-2 level and appears normal. Paraspinal and other soft tissues: Nonspecific retroperitoneal and subcutaneous edema. No visible abscess or discrete inflammation. Disc levels: Good disc height and hydration.  Negative facets.  No impingement. There is pervasive motion artifact in this patient with pain. Pathologic findings could be obscured. IMPRESSION: 1. No visible discitis/osteomyelitis or abscess. No acute finding throughout the visualized spine. 2. Extensive motion degradation. Electronically Signed   By: Monte Fantasia M.D.   On: 02/18/2018 21:12   Mr Lumbar Spine W Wo Contrast  Result Date: 02/18/2018 CLINICAL DATA:  Infective endocarditis. EXAM: MRI TOTAL SPINE WITHOUT AND WITH CONTRAST TECHNIQUE: Multisequence MR imaging of the spine from the cervical spine to the sacrum was performed prior to and following IV contrast  administration. CONTRAST:  47mL MULTIHANCE GADOBENATE DIMEGLUMINE 529 MG/ML IV SOLN COMPARISON:  None. FINDINGS: MRI CERVICAL SPINE FINDINGS Alignment: Mild C3-4 anterolisthesis. Vertebrae: No fracture, evidence of discitis, or bone lesion.Diffuse hypointense marrow likely related to patient's anemia. Cord: Normal signal and morphology. Posterior Fossa, vertebral arteries, paraspinal tissues: No convincing inflammation. Disc levels: Shallow disc protrusions at C3-4, C5-6, and C6-7. No visible cord impingement. Limited assessment of foraminal narrowing due to motion. MRI THORACIC SPINE FINDINGS Alignment:  Normal. Vertebrae: No evidence of discitis, fracture, or aggressive lesion. Diffuse hypointense marrow likely related to patient's anemia. Cord:  Normal signal and morphology. Paraspinal and other soft tissues: No visible abscess or inflammation. Disc levels: Tiny T8-9 disc protrusion.  No impingement. MRI LUMBAR SPINE FINDINGS Segmentation:  5 lumbar type vertebral bodies. Alignment:  Normal. Vertebrae: Diffusely hypointense marrow as above. No evidence of discitis, fracture, or bone lesion. Conus medullaris: Extends to the L1-2 level and appears normal. Paraspinal and other soft tissues: Nonspecific retroperitoneal and subcutaneous edema. No visible abscess or discrete inflammation. Disc levels: Good disc height and hydration.  Negative facets.  No impingement. There is pervasive motion artifact in this patient with pain. Pathologic findings could be obscured. IMPRESSION: 1. No visible discitis/osteomyelitis or abscess. No acute finding throughout the visualized spine. 2. Extensive motion degradation. Electronically Signed   By: Monte Fantasia M.D.   On: 02/18/2018 21:12    Lab Data:  CBC: Recent Labs  Lab 02/15/18 1825 02/16/18 2147 02/16/2018 2100 02/19/18 0439 02/20/18 0505  WBC 11.3* 7.4 10.7* 8.7 10.8*  NEUTROABS 9.7* 5.6  --   --   --   HGB 10.2* 9.7* 10.4* 8.7* 8.8*  HCT 30.8* 28.8* 30.6*  25.7* 26.4*  MCV 89.3 88.1 87.2 88.3 88.6  PLT 178 166 187 186 161   Basic Metabolic Panel: Recent Labs  Lab 02/16/18 2147 02/06/2018 2100 02/18/18 0446 02/19/18 0439 02/20/18 0505  NA 131* 133* 132* 137 138  K 3.5 3.4* 3.4* 3.4* 3.1*  CL 95* 97* 99* 108 107  CO2 25 21* 21* 20* 22  GLUCOSE 135* 136* 102* 92 107*  BUN 14 15 13 6  <5*  CREATININE 1.25* 1.48* 1.39* 1.13 1.05  CALCIUM 10.3 10.2 9.1 9.1 9.6   GFR: Estimated Creatinine Clearance: 80.2 mL/min (by C-G formula based on SCr of 1.05 mg/dL). Liver Function Tests: Recent Labs  Lab 02/15/18 1825 02/16/18 2147 03/03/2018 2220 02/19/18 0439  AST 23 33 31 18  ALT 9* 11* 12* 8*  ALKPHOS 86 78 85 62  BILITOT 0.7 0.7 0.7 0.5  PROT 7.0 6.6 6.3* 5.7*  ALBUMIN 2.4* 2.3* 2.3* 1.9*   No results for input(s): LIPASE, AMYLASE in the last 168 hours. No results for input(s): AMMONIA in the last 168 hours. Coagulation Profile: Recent Labs  Lab 03/03/2018 2100  INR 1.13   Cardiac Enzymes: Recent Labs  Lab 02/18/18 0440 02/18/18 0935 02/18/18 2105 02/19/18 0439  TROPONINI 0.23* 0.18* 0.16* 0.25*   BNP (last 3 results) No results for input(s): PROBNP in the last 8760 hours. HbA1C: No results for input(s): HGBA1C in the last 72 hours. CBG: No results for input(s): GLUCAP in the last 168 hours. Lipid Profile: No results for input(s): CHOL, HDL, LDLCALC, TRIG, CHOLHDL, LDLDIRECT in the last 72 hours. Thyroid Function Tests: No results for input(s): TSH, T4TOTAL, FREET4, T3FREE, THYROIDAB in the last 72 hours. Anemia Panel: No results for input(s): VITAMINB12, FOLATE, FERRITIN, TIBC, IRON, RETICCTPCT in the last 72 hours. Urine analysis:    Component Value Date/Time   COLORURINE YELLOW 02/18/2018 0400   APPEARANCEUR CLEAR 02/18/2018 0400   LABSPEC 1.024 02/18/2018 0400   PHURINE 5.0 02/18/2018 0400   GLUCOSEU NEGATIVE 02/18/2018 0400   HGBUR LARGE (A) 02/18/2018 0400   BILIRUBINUR NEGATIVE 02/18/2018 0400   KETONESUR  NEGATIVE 02/18/2018 0400   PROTEINUR NEGATIVE 02/18/2018 0400   NITRITE NEGATIVE 02/18/2018 0400   LEUKOCYTESUR SMALL (A) 02/18/2018 0400     Malvern Kadlec M.D. [facility] 02/20/2018, 10:45 AM  Pager: [phone] Between 7am to 7pm - call Pager - [phone]  After 7pm go to [email or web address] - password [email or web address]  Call night coverage person covering after 7pm

## 2018-02-20 NOTE — Consult Note (Addendum)
[facility]    Date of Admission:  02/22/2018   Total days of antibiotics 4        Day 4 Ceftriaxone               Reason for Consult: Viridans Strep Bacteremia, Aortic Valve Endocarditis     Referring Provider: Rai Primary Care Provider: Patient, No Pcp Per   Assessment: 52 y.o. 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 Reports no recent IV drug use. No recent dental infections/procedures and had all remaining teeth pulled 3 years prior. He has had some changes in neurologic processing (word searching, difficulty getting thoughts in order) as well as some left-sided weakness and shaking. I think considering he has a known large vegetation on his aortic valve we should consider imaging his brain to rule out CNS involvement - if he does have CNS involvement would need to increase his ceftriaxone to twice daily.   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 Seems he had no formal addiction treatment/counseling and more or less quit "cold [place]" due to incarceration. Very high anxiety level.   Plan: 1. Continue Ceftriaxone (PCN MIC 0.12 - micro lab has updated this in the system).  2. TEE tomorrow --> if he has severe valve destruction likely will need TCTS consult for consideration of repair/replacement.   3. Repeat BCx today; hold on PICC for now  4. Would check imaging of the brain to assess for possible septic infarction (MRI preferred but not certain he will comply with anxiety) May need to change antibiotic either to higher dose Ceftriaxone for CNS penetration vs high dose PCN infusion.  5. Would not recommend discharge with PICC line. Inpatient vs SNF for observed therapy to complete 6 week course of treatment.    Principal  Problem:   Bacteremia due to Streptococcus Active Problems:   Aortic valve endocarditis   Chronic coronary artery disease   Pain syndrome, chronic   NSTEMI (non-ST elevated myocardial infarction) (HCC)   IV drug abuse (HCC)   Renal insufficiency   Normocytic anemia   Hypotension   . nicotine  21 mg Transdermal Daily  . pantoprazole  40 mg Oral Daily  . potassium chloride  40 mEq Oral Once    HPI: Aaron Dodson is a 52 y.o. male admitted to [facility] on 03/05/2018 with subacute fevers, generalized weakness and exertional dyspnea. In the past he reports IV drug use (last used 2.5 - 3 years prior) and prior treatment for endocarditis. On 3/21 he went to urgent care with symptoms and was started on an antibiotic. Seen again by his PCP on 4/3 and reported that he had pericarditis and continued his antibiotic and another medication. He has continued with intermittent fevers and night sweats for several months now as well as 36 lb weight loss d/t lack of appetite and weakness. He also has had some worry about his difficulty finding words and "saying what he is thinking" as well as left arm weakness and tremor/shaking.   He does not want a PICC line again as he is very worried about "Temptation" he would incur as a result of this. No drug screen obtained during present admission. One year ago was (+) for cocaine and  opiates.   Review of Systems: Review of Systems  All other systems reviewed and are negative.   Past Medical History:  Diagnosis Date  . ADD (attention deficit disorder)   . Anxiety   . Chronic chest pain   . Chronic lower back pain   . Club foot   . COPD (chronic obstructive pulmonary disease) ([place])   . Coronary artery disease    a. s/p multiple prior PCIs (LAD, LCx, OM1 - [place]; [place]). b. Multiple prior caths, last in 12/2016 - medical therapy, no new lesions, nonobstructive.  . Endocarditis 2013  . GERD (gastroesophageal reflux disease)   . History of echocardiogram     Echo 11/09/16: mild LVH, EF 55-60, no RWMA, Gr 1 DD, mildly dilated aortic root (38 mm)  . Hyperlipidemia   . Hypertension   . IV drug abuse ([place])   . Myocardial infarction [facility]) 2000's - ~ 04/2015   "I've had a total of 3" (11/08/2016)  . PAF (paroxysmal atrial fibrillation) (HCC)    on Eliquis  . PTSD (post-traumatic stress disorder)   . Pulmonary embolism ([place])    "I've had 2 or 3" (11/08/2016)  . 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 Septic pulmonary embolism ([place]) 2013  . Squamous cell carcinoma of scalp   . Stroke [facility]) ~ 2005-2006 X 2   denies residual on 11/08/2016    Social History   Tobacco Use  . Smoking status: Current Every Day Smoker    Packs/day: 0.50    Years: 35.00    Pack years: 17.50    Types: Cigarettes  . Smokeless tobacco: Never Used  Substance Use Topics  . Alcohol use: Not Currently  . Drug use: Yes    Comment: Molly, last used  heroin 2.5 years ago    Family History  Problem Relation Age of Onset  . Hypertension Father   . Lung cancer Father   . Heart attack Father    Allergies  Allergen Reactions  . Benadryl [Diphenhydramine Hcl] Other (See Comments)    Aggressive behavior  . Promethazine Hcl Other (See Comments)    Unknown - given for surgical procedure and advised to never take again  . Augmentin [Amoxicillin-Pot Clavulanate] Diarrhea and Nausea Only  . Toradol [Ketorolac Tromethamine] Hives and Other (See Comments)    Redness (also) Patient states he tolerated ibuprofen fine    OBJECTIVE: Blood pressure (!) 112/54, pulse (!) 109, temperature 99 F (37.2 C), temperature source Oral, resp. rate 18, height 6' (1.829 m), weight 150 lb 3.2 oz (68.1 kg), SpO2 98 %.  Physical Exam  Constitutional:  Thin appearing, chronically ill male seated comfortably on the bed.   Cardiovascular: Normal rate, regular rhythm, intact distal pulses and normal pulses.  Murmur heard.  Diastolic murmur is present with a grade of  3/6. Pulmonary/Chest: Effort normal and breath sounds normal. He has no decreased breath sounds.  Abdominal: Soft. Bowel sounds are normal. There is no tenderness.  Musculoskeletal:       Right lower leg: He exhibits no edema.  Left AKA with prosthesis in place.   Neurological: He is alert. No sensory deficit. GCS eye subscore is 4. GCS verbal subscore is 5. GCS motor subscore is 6.  Confused at times during conversation thinking his wife was in the bathroom. Tremor L>R. Unable to open soda can or hold it well d/t weakness.   Skin: Skin is warm and dry. Capillary refill takes less than 2  seconds. No rash noted.  Nursing note and vitals reviewed.   Lab Results Lab Results  Component Value Date   WBC 10.8 (H) 02/20/2018   HGB 8.8 (L) 02/20/2018   HCT 26.4 (L) 02/20/2018   MCV 88.6 02/20/2018   PLT 222 02/20/2018    Lab Results  Component Value Date   CREATININE 1.05 02/20/2018   BUN <5 (L) 02/20/2018   NA 138 02/20/2018   K 3.1 (L) 02/20/2018   CL 107 02/20/2018   CO2 22 02/20/2018    Lab Results  Component Value Date   ALT 8 (L) 02/19/2018   AST 18 02/19/2018   ALKPHOS 62 02/19/2018   BILITOT 0.5 02/19/2018     Microbiology: BCx 4/10 >> viridans strep 2/2 sets BCx 4/12 >> viridans strep 2/2 sets  BCx 4/15 >> pending  Janene Madeira, MSN, NP-C [facility] [place] Cell: [phone] Pager: [phone]  02/20/2018 12:58 PM

## 2018-02-20 NOTE — Progress Notes (Signed)
Pt is non compliant with the safety measures, has LT BKA and also has heparin infusion running he doesn't want bed alarm on, dont want to wear his prosthesis when getting out of bed rather  wants to walk in  his room with the IV pole but not a walker or any other equipment, he has been educated on the need to keep bed alarm on but has  still refused it, charge nurse Lelan Pons has been informed about pt non compliant with safety measures

## 2018-02-20 NOTE — H&P (View-Only) (Signed)
Cardiology Consultation:   Patient ID: Aaron Dodson; [id number]; August 23, 1966   Admit date: 02/22/2018 Date of Consult: 02/20/2018  Primary Care Provider: Patient, No Pcp Per Primary Cardiologist: Dr. Marlou Dodson    Patient Profile:   Aaron Dodson is a 52 y.o. male with a hx of Aaron Dodson is a 52 y.o. 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 Tana Dodson.   He has numerous evaluations over the last several years with multiple cardiac caths both in the [facility] and also appearing in [place], as well as stress tests and CT angios to exclude PE. Last cath 12/2016 showed mild nonobstructive CAD with "Mild nonobstructive residual CAD with coronary calcification of the proximal LAD with 20% proximal stenosis, 20% mid stenosis, and a patent distal LAD stent with 10-15% intimal hyperplasia; widely patent stent extending just beyond the ostium of the circumflex vessel into the OM1 vessel with jailing of a small branch of this marginal vessel; and extensive stenting to a dominant RCA with mild 30% focal mid stent narrowing." Medical therapy recommended.  Last echo 11/2016 showed EF 55-60%, normal RV, no significant valvular abnormality.  Last seen by APP 04/2017  In clinic. Continued to have atypical chest pain. No work up warranted.    History of Present Illness:   Aaron Dodson presented 02/15/2018 with fever and DOE. Admitted for spsis with strep viridans bacteremia with aortic valve endocarditis. Seen  by ID today and recommended imagining of brain to r/o CNS involvement given some neurologic changes. Cardiology asked for aortic valve vegetation and elevated troponin. He has intermittent chest pain at baseline but no exacerbating symptoms. Chronic baseline dyspnea due to sedentary lifestyle.   He has intermittent fever for about 1 month. Noted word finding difficulty as well as left arm weakness. During my conversation, he admitted to use "IV Molly". Wife find out this today.   Past Medical History:  Diagnosis Date  . ADD (attention deficit disorder)   . Anxiety   . Chronic chest pain   . Chronic lower back pain   . Club foot   . COPD (chronic obstructive pulmonary disease) ([place])   . Coronary artery disease    a. s/p multiple prior PCIs (LAD, LCx, OM1 - [place]; [place]). b. Multiple prior caths, last in 12/2016 - medical therapy, no new lesions, nonobstructive.  . Endocarditis 2013  . GERD (gastroesophageal reflux disease)   . History of echocardiogram    Echo 11/09/16: mild LVH, EF 55-60, no RWMA, Gr 1 DD, mildly dilated aortic root (38 mm)  . Hyperlipidemia   . Hypertension   . IV drug abuse ([place])   . Myocardial infarction [facility]) 2000's - ~ 04/2015   "I've had a total of 3" (11/08/2016)  . PAF (paroxysmal atrial fibrillation) ([facility])    on Eliquis  . PTSD (post-traumatic stress disorder)   . Pulmonary embolism ([place])    "I've had 2 or 3" (11/08/2016)  . 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 Septic  pulmonary embolism ([place]) 2013  . Squamous cell carcinoma of scalp   . Stroke [facility]) ~ 2005-2006 X 2   denies residual on 11/08/2016    Past Surgical History:  Procedure Laterality Date  . ABOVE KNEE LEG AMPUTATION Left   . CARDIAC CATHETERIZATION  "several"  . CARDIAC CATHETERIZATION N/A 11/26/2015   Procedure: Left Heart Cath and Coronary Angiography;  Surgeon: Burnell Blanks, MD;  Location: [facility];  Service: Cardiovascular;  Laterality: N/A;  .  CLUB FOOT RELEASE Left December 25, 1966  . CORONARY ANGIOPLASTY  "several"  . CORONARY ANGIOPLASTY WITH STENT PLACEMENT  "several"   "total of 10 stents placed" (11/18/2015)  . LAPAROSCOPIC CHOLECYSTECTOMY    . LEFT HEART CATH AND CORONARY ANGIOGRAPHY N/A 12/20/2016   Procedure: Left Heart Cath and Coronary Angiography;  Surgeon: Troy Sine, MD;  Location: [facility];  Service: Cardiovascular;  Laterality: N/A;  . ORTHOPEDIC SURGERY  1967/01/07-~ 2007   "total of 52 on my left leg; started w/club foot released"  . TONSILLECTOMY       Inpatient Medications: Scheduled Meds: . nicotine  21 mg Transdermal Daily  . pantoprazole  40 mg Oral Daily   Continuous Infusions: . heparin 1,850 Units/hr (02/20/18 0851)  . pencillin G potassium IV     PRN Meds: acetaminophen, ALPRAZolam, cyclobenzaprine, loperamide, morphine injection, nitroGLYCERIN, ondansetron (ZOFRAN) IV, oxyCODONE, zolpidem  Allergies:    Allergies  Allergen Reactions  . Benadryl [Diphenhydramine Hcl] Other (See Comments)    Aggressive behavior  . Promethazine Hcl Other (See Comments)    Unknown - given for surgical procedure and advised to never take again  . Augmentin [Amoxicillin-Pot Clavulanate] Diarrhea and Nausea Only  . Toradol [Ketorolac Tromethamine] Hives and Other (See Comments)    Redness (also) Patient states he tolerated ibuprofen fine    Social History:   Social History   Socioeconomic History  . Marital status: Divorced    Spouse name: Not on file  . Number of children: Not on file  . Years of education: Not on file  . Highest education level: Not on file  Occupational History  . Not on file  Social Needs  . Financial resource strain: Not on file  . Food insecurity:    Worry: Not on file    Inability: Not on file  . Transportation needs:    Medical: Not on file    Non-medical: Not on file  Tobacco Use  . Smoking status: Current Every Day Smoker    Packs/day: 0.50    Years: 35.00    Pack  years: 17.50    Types: Cigarettes  . Smokeless tobacco: Never Used  Substance and Sexual Activity  . Alcohol use: Not Currently  . Drug use: Yes    Comment: Molly, last used  heroin 2.5 years ago  . Sexual activity: Not Currently  Lifestyle  . Physical activity:    Days per week: Not on file    Minutes per session: Not on file  . Stress: Not on file  Relationships  . Social connections:    Talks on phone: Not on file    Gets together: Not on file    Attends religious service: Not on file    Active member of club or organization: Not on file    Attends meetings of clubs or organizations: Not on file    Relationship status: Not on file  . Intimate partner violence:    Fear of current or ex partner: Not  on file    Emotionally abused: Not on file    Physically abused: Not on file    Forced sexual activity: Not on file  Other Topics Concern  . Not on file  Social History Narrative   Lives with ex wife by his report.  On disability.      Family History:   Family History  Problem Relation Age of Onset  . Hypertension Father   . Lung cancer Father   . Heart attack Father      ROS:  Please see the history of present illness.  All other ROS reviewed and negative.     Physical Exam/Data:   Vitals:   02/19/18 1322 02/19/18 1722 02/19/18 2024 02/20/18 0446  BP: (!) 98/58 124/61 (!) 117/58 (!) 112/54  Pulse: 97 (!) 101 99 (!) 109  Resp:  17 18 18   Temp:   97.8 F (36.6 C) 99 F (37.2 C)  TempSrc:   Oral Oral  SpO2: 98% 99% 98% 98%  Weight:    150 lb 3.2 oz (68.1 kg)  Height:        Intake/Output Summary (Last 24 hours) at 02/20/2018 1428 Last data filed at 02/20/2018 1100 Gross per 24 hour  Intake 1082 ml  Output 501 ml  Net 581 ml   Filed Weights   02/22/2018 2040 02/20/18 0446  Weight: 156 lb (70.8 kg) 150 lb 3.2 oz (68.1 kg)   Body mass index is 20.37 kg/m.  General:  Ill appearing male in no acute distress HEENT: normal Lymph: no adenopathy Neck: no  JVD Endocrine:  No thryomegaly Vascular: No carotid bruits; FA pulses 2+ bilaterally without bruits  Cardiac:  normal S1, S2; RRR; 3/6 murmur Lungs:  clear to auscultation bilaterally, no wheezing, rhonchi or rales  Abd: soft, nontender, no hepatomegaly  Ext: no edema Musculoskeletal: Left AKA Skin: warm and dry  Neuro: no focal abnormalities noted; left sided weakness Psych:  Normal affect   EKG:  The EKG was personally reviewed and demonstrates:  Sinus rhythm with PACs at rate of 106 bpm  Telemetry:  Telemetry was personally reviewed and demonstrates:  Sinus tachycardia at rate of 100s  Relevant CV Studies: Echo 02/19/18 Study Conclusions  - Left ventricle: The cavity size was normal. Wall thickness was   normal. Indeterminant diastolic function. Systolic function was   normal. The estimated ejection fraction was in the range of 55%   to 60%. Basal inferior akinesis. - Aortic valve: There was a large vegetation on the aortic valve.   Mild aortic stenosis, this may be created by vegetation. There   was probably severe regurgitation. There does appear to be   holodiastolic flow reversal in the descending thoracic aorta.   Mean gradient (S): 12 mm Hg. Valve area (VTI): 1.54 cm^2. - Mitral valve: There was mild regurgitation. - Left atrium: The atrium was mildly dilated. - Right ventricle: The cavity size was normal. Systolic function   was normal. - Pulmonary arteries: No complete TR doppler jet so unable to   estimate PA systolic pressure. - Systemic veins: IVC measured 2.4 cm with < 50% respirophasic   variation, suggesting RA pressure 8 mmHg.  Impressions:  - Normal LV size with EF 55-60%. Basal inferior akinesis. Normal RV   size and systolic function. There was a large vegetation on the   aortic valve. Aortic regurgitation appeared to be severe. No   other obvious vegetations. Mild MR. Suggest TEE to further   evaluate.  Cath 12/20/16  Left Heart Cath and Coronary  Angiography  Conclusion     Ost RCA to 4th RCA lesion, 10 %stenosed.  Mid RCA lesion, 30 %stenosed.  Dist LAD lesion, 15 %stenosed.  Ost LAD lesion, 20 %stenosed.  Mid LAD lesion, 20 %stenosed.  Ost 1st Mrg to 1st Mrg lesion, 0 %stenosed.  Ost Cx to Prox Cx lesion, 0 %stenosed.  The left ventricular systolic function is normal.  LV end diastolic pressure is normal.  The left ventricular ejection fraction is 55-65% by visual estimate.   Normal LV function with an ejection fraction of 55-60% without focal segmental wall motion abnormalities.  Mild nonobstructive residual CAD with coronary calcification of the proximal LAD with 20% proximal stenosis, 20% mid stenosis, and a patent distal LAD stent with 10-15% intimal hyperplasia; widely patent stent extending just beyond the ostium of the circumflex vessel into the OM1 vessel with jailing of a small branch of this marginal vessel; and extensive stenting to a dominant RCA with mild 30% focal mid stent narrowing.  RECOMMENDATION: Mediical therapy.     Laboratory Data:  Chemistry Recent Labs  Lab 02/18/18 0446 02/19/18 0439 02/20/18 0505  NA 132* 137 138  K 3.4* 3.4* 3.1*  CL 99* 108 107  CO2 21* 20* 22  GLUCOSE 102* 92 107*  BUN 13 6 <5*  CREATININE 1.39* 1.13 1.05  CALCIUM 9.1 9.1 9.6  GFRNONAA 57* >60 >60  GFRAA >60 >60 >60  ANIONGAP 12 9 9     Recent Labs  Lab 02/16/18 2147 02/14/2018 2220 02/19/18 0439  PROT 6.6 6.3* 5.7*  ALBUMIN 2.3* 2.3* 1.9*  AST 33 31 18  ALT 11* 12* 8*  ALKPHOS 78 85 62  BILITOT 0.7 0.7 0.5   Hematology Recent Labs  Lab 02/09/2018 2100 02/19/18 0439 02/20/18 0505  WBC 10.7* 8.7 10.8*  RBC 3.51* 2.91* 2.98*  HGB 10.4* 8.7* 8.8*  HCT 30.6* 25.7* 26.4*  MCV 87.2 88.3 88.6  MCH 29.6 29.9 29.5  MCHC 34.0 33.9 33.3  RDW 15.4 16.1* 16.6*  PLT 187 186 222   Cardiac Enzymes Recent Labs  Lab 02/18/18 0440 02/18/18 0935 02/18/18 2105 02/19/18 0439  TROPONINI 0.23* 0.18*  0.16* 0.25*    Recent Labs  Lab 02/07/2018 2110  TROPIPOC 0.26*    Radiology/Studies:  Dg Chest 2 View  Result Date: 03/06/2018 CLINICAL DATA:  Shortness of breath and fever EXAM: CHEST - 2 VIEW COMPARISON:  February 15, 2018 FINDINGS: There is no edema or consolidation. The heart size and pulmonary vascularity are normal. No adenopathy. There are multiple foci of coronary artery calcification. No bone lesions. IMPRESSION: Extensive coronary artery calcification.  No edema or consolidation. Electronically Signed   By: Lowella Grip III M.D.   On: 02/13/2018 21:25   Ct Angio Chest Pe W And/or Wo Contrast  Result Date: 02/13/2018 CLINICAL DATA:  Pericarditis, difficulty breathing with fevers. History of IVDA. EXAM: CT ANGIOGRAPHY CHEST WITH CONTRAST TECHNIQUE: Multidetector CT imaging of the chest was performed using the standard protocol during bolus administration of intravenous contrast. Multiplanar CT image reconstructions and MIPs were obtained to evaluate the vascular anatomy. CONTRAST:  184mL ISOVUE-370 IOPAMIDOL (ISOVUE-370) INJECTION 76% COMPARISON:  11/09/2016 CT FINDINGS: Cardiovascular: The study is of quality for the evaluation of pulmonary embolism. There are no filling defects in the central, lobar, segmental or subsegmental pulmonary artery branches to suggest acute pulmonary embolism. Great vessels are normal in course and caliber. Top-normal heart size. No significant pericardial fluid/thickening. Mediastinum/Nodes: No discrete thyroid  nodules. Unremarkable esophagus. No pathologically enlarged axillary, mediastinal or hilar lymph nodes. Lungs/Pleura: No pneumothorax. No pleural effusion. Stable 7 mm nodular density in the medial left upper lobe, series 6/20. Centrilobular emphysema without acute pneumonic consolidation. Bibasilar dependent atelectasis. Upper abdomen: Unremarkable. Musculoskeletal:  No aggressive appearing focal osseous lesions. Review of the MIP images confirms the  above findings. IMPRESSION: 1. No acute pulmonary embolus. 2. Centrilobular emphysema. 3. Stable 7 mm nodular density in the medial left lung apex. As this appears stable since 11/09/2016, additional follow-up at 18-24 months from the 11/09/2016 study is recommended in high risk patients which given patient's COPD and IVDA history, would be recommended. This recommendation follows the consensus statement: Guidelines for Management of Incidental Pulmonary Nodules Detected on CT Images: From the Fleischner Society 2017; Radiology 2017; 284:228-243. Emphysema (ICD10-J43.9). Electronically Signed   By: Ashley Royalty M.D.   On: 03/05/2018 23:15   Mr Cervical Spine W Wo Contrast  Result Date: 02/18/2018 CLINICAL DATA:  Infective endocarditis. EXAM: MRI TOTAL SPINE WITHOUT AND WITH CONTRAST TECHNIQUE: Multisequence MR imaging of the spine from the cervical spine to the sacrum was performed prior to and following IV contrast administration. CONTRAST:  84mL MULTIHANCE GADOBENATE DIMEGLUMINE 529 MG/ML IV SOLN COMPARISON:  None. FINDINGS: MRI CERVICAL SPINE FINDINGS Alignment: Mild C3-4 anterolisthesis. Vertebrae: No fracture, evidence of discitis, or bone lesion.Diffuse hypointense marrow likely related to patient's anemia. Cord: Normal signal and morphology. Posterior Fossa, vertebral arteries, paraspinal tissues: No convincing inflammation. Disc levels: Shallow disc protrusions at C3-4, C5-6, and C6-7. No visible cord impingement. Limited assessment of foraminal narrowing due to motion. MRI THORACIC SPINE FINDINGS Alignment:  Normal. Vertebrae: No evidence of discitis, fracture, or aggressive lesion. Diffuse hypointense marrow likely related to patient's anemia. Cord:  Normal signal and morphology. Paraspinal and other soft tissues: No visible abscess or inflammation. Disc levels: Tiny T8-9 disc protrusion.  No impingement. MRI LUMBAR SPINE FINDINGS Segmentation:  5 lumbar type vertebral bodies. Alignment:  Normal.  Vertebrae: Diffusely hypointense marrow as above. No evidence of discitis, fracture, or bone lesion. Conus medullaris: Extends to the L1-2 level and appears normal. Paraspinal and other soft tissues: Nonspecific retroperitoneal and subcutaneous edema. No visible abscess or discrete inflammation. Disc levels: Good disc height and hydration.  Negative facets.  No impingement. There is pervasive motion artifact in this patient with pain. Pathologic findings could be obscured. IMPRESSION: 1. No visible discitis/osteomyelitis or abscess. No acute finding throughout the visualized spine. 2. Extensive motion degradation. Electronically Signed   By: Monte Fantasia M.D.   On: 02/18/2018 21:12   Mr Thoracic Spine W Wo Contrast  Result Date: 02/18/2018 CLINICAL DATA:  Infective endocarditis. EXAM: MRI TOTAL SPINE WITHOUT AND WITH CONTRAST TECHNIQUE: Multisequence MR imaging of the spine from the cervical spine to the sacrum was performed prior to and following IV contrast administration. CONTRAST:  89mL MULTIHANCE GADOBENATE DIMEGLUMINE 529 MG/ML IV SOLN COMPARISON:  None. FINDINGS: MRI CERVICAL SPINE FINDINGS Alignment: Mild C3-4 anterolisthesis. Vertebrae: No fracture, evidence of discitis, or bone lesion.Diffuse hypointense marrow likely related to patient's anemia. Cord: Normal signal and morphology. Posterior Fossa, vertebral arteries, paraspinal tissues: No convincing inflammation. Disc levels: Shallow disc protrusions at C3-4, C5-6, and C6-7. No visible cord impingement. Limited assessment of foraminal narrowing due to motion. MRI THORACIC SPINE FINDINGS Alignment:  Normal. Vertebrae: No evidence of discitis, fracture, or aggressive lesion. Diffuse hypointense marrow likely related to patient's anemia. Cord:  Normal signal and morphology. Paraspinal and other soft tissues: No visible abscess  or inflammation. Disc levels: Tiny T8-9 disc protrusion.  No impingement. MRI LUMBAR SPINE FINDINGS Segmentation:  5 lumbar  type vertebral bodies. Alignment:  Normal. Vertebrae: Diffusely hypointense marrow as above. No evidence of discitis, fracture, or bone lesion. Conus medullaris: Extends to the L1-2 level and appears normal. Paraspinal and other soft tissues: Nonspecific retroperitoneal and subcutaneous edema. No visible abscess or discrete inflammation. Disc levels: Good disc height and hydration.  Negative facets.  No impingement. There is pervasive motion artifact in this patient with pain. Pathologic findings could be obscured. IMPRESSION: 1. No visible discitis/osteomyelitis or abscess. No acute finding throughout the visualized spine. 2. Extensive motion degradation. Electronically Signed   By: Monte Fantasia M.D.   On: 02/18/2018 21:12   Mr Lumbar Spine W Wo Contrast  Result Date: 02/18/2018 CLINICAL DATA:  Infective endocarditis. EXAM: MRI TOTAL SPINE WITHOUT AND WITH CONTRAST TECHNIQUE: Multisequence MR imaging of the spine from the cervical spine to the sacrum was performed prior to and following IV contrast administration. CONTRAST:  38mL MULTIHANCE GADOBENATE DIMEGLUMINE 529 MG/ML IV SOLN COMPARISON:  None. FINDINGS: MRI CERVICAL SPINE FINDINGS Alignment: Mild C3-4 anterolisthesis. Vertebrae: No fracture, evidence of discitis, or bone lesion.Diffuse hypointense marrow likely related to patient's anemia. Cord: Normal signal and morphology. Posterior Fossa, vertebral arteries, paraspinal tissues: No convincing inflammation. Disc levels: Shallow disc protrusions at C3-4, C5-6, and C6-7. No visible cord impingement. Limited assessment of foraminal narrowing due to motion. MRI THORACIC SPINE FINDINGS Alignment:  Normal. Vertebrae: No evidence of discitis, fracture, or aggressive lesion. Diffuse hypointense marrow likely related to patient's anemia. Cord:  Normal signal and morphology. Paraspinal and other soft tissues: No visible abscess or inflammation. Disc levels: Tiny T8-9 disc protrusion.  No impingement. MRI LUMBAR  SPINE FINDINGS Segmentation:  5 lumbar type vertebral bodies. Alignment:  Normal. Vertebrae: Diffusely hypointense marrow as above. No evidence of discitis, fracture, or bone lesion. Conus medullaris: Extends to the L1-2 level and appears normal. Paraspinal and other soft tissues: Nonspecific retroperitoneal and subcutaneous edema. No visible abscess or discrete inflammation. Disc levels: Good disc height and hydration.  Negative facets.  No impingement. There is pervasive motion artifact in this patient with pain. Pathologic findings could be obscured. IMPRESSION: 1. No visible discitis/osteomyelitis or abscess. No acute finding throughout the visualized spine. 2. Extensive motion degradation. Electronically Signed   By: Monte Fantasia M.D.   On: 02/18/2018 21:12   Assessment and Plan:   1. Aortic valve endocarditis with severe regurgitation - Due to strep viridans bacteremia from using  "IV Molly". He has denies IV drug usage since admit.  - TEE tomorrow  2. Elevated troponin  - Flat trend 0.23>>0.18>>0.16>>0.25. No excerebration of chest pain. EKG without acute changes. He is not a candidate for invasive evaluation. Will start him on ASA 81mg  qd (he was on ASA and plavix during last office visit). Continue IV heparin.   3. CAD - Last cath in 12/2016 showed Mild nonobstructive residual CAD with patent stent.  - Check lipid panel. ADD statin.  4. Bacteremia with possible CNS involvement - Pending Brain eval. Per ID.   For questions or updates, please contact [place] Please consult [email or web address] for contact info under Cardiology/STEMI.   Mahalia Longest [place], [place]  02/20/2018 2:28 PM    The patient was seen, examined and discussed with Bhagat,Bhavinkumar PA-C and I agree with the above.   52 y.o. 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 Tana Dodson.  The patient had the last cath in 12/2016 showed mild to moderate nonobstructive CAD with "Mild nonobstructive residual CAD with coronary calcification of the proximal LAD with 20% proximal stenosis, 20% mid stenosis, and a patent distal LAD stent with 10-15% intimal hyperplasia; widely patent stent extending just beyond the ostium of the circumflex vessel into the OM1 vessel with jailing of a small branch of this marginal vessel; and extensive stenting to a dominant RCA with mild 30% focal mid stent narrowing." Medical therapy recommended. Last echo 11/2016 showed EF 55-60%, normal RV, no significant valvular abnormality.  He presented with on 02/14/2018 with fever and DOE. Admitted for sepsis with strep viridans bacteremia with aortic valve endocarditis. Seen by ID today and recommended imagining of brain to r/o CNS involvement given some neurologic changes. Echo shows a large vegetation of his aortic valve. Troponin elevated 0.16->0.25.   Plan: Continue iv ATB per ID recommendations.  Follow brain CT for evaluation of septic emboli. Consult surgery if positive, however doubt he is a surgical candidate given chronic IVDA, non-compliance, polymorbidity. Follow troponin trend, if he undergoes AVR, he would need a cath, however high risk given large vegetation and septic emboli. CT not an option given prior stents.  He doesn't appear fluid overloaded on physical exam.   Ena Dawley, MD 02/20/2018

## 2018-02-20 NOTE — Progress Notes (Signed)
Initial Nutrition Assessment  DOCUMENTATION CODES:   Not applicable  INTERVENTION:  Ensure Enlive po BID, each supplement provides 350 kcal and 20 grams of protein  NUTRITION DIAGNOSIS:   Inadequate oral intake related to chronic illness as evidenced by per patient/family report, energy intake < 75% for > or equal to 1 month.  GOAL:   Patient will meet greater than or equal to 90% of their needs  MONITOR:   PO intake, Supplement acceptance  REASON FOR ASSESSMENT:   Malnutrition Screening Tool    ASSESSMENT:   52 y.o. male w/ a remote hx of IV drug use (no use in years per pt), prior tx for endocarditis, CAD with stents, chronic pain, and anxiety who presented to the ED w/ fevers "for a few months" and exertional dyspnea.  Patient had been previously evaluated w/ blood cultures growing Strep viridans, but his management was complicated by his leaving the ED AMA.  He presents with Severe Sepsis, bacteremia, endocarditis; NSTEMI  Mr. Watford reports poor PO over 1.5 months. Was eating 1 big meal every 3 days while drinking many liquids otherwise. He states it is usual beef related, something easy to access because he has felt so weak. Was having trouble getting out of bed and sleeping from 6 am - 6 pm and over night as well. Reports losing weight from 196 - 150 pounds over 6 weeks, unable to verify this weight loss. NFPE looks ok.Had pot roast, mashed potatoes, and gravy for lunch today  - ate 100%. Meal completion 95-100% so far.  Labs reviewed:  K+ 3.1  Medications reviewed and include: Heparin gtt  NUTRITION - FOCUSED PHYSICAL EXAM:    Most Recent Value  Orbital Region  No depletion  Upper Arm Region  No depletion  Thoracic and Lumbar Region  No depletion  Buccal Region  No depletion  Temple Region  Moderate depletion  Clavicle Bone Region  No depletion  Clavicle and Acromion Bone Region  No depletion  Scapular Bone Region  No depletion  Dorsal Hand  No depletion   Patellar Region  No depletion  Anterior Thigh Region  No depletion  Posterior Calf Region  No depletion       Diet Order:  Diet regular Room service appropriate? Yes; Fluid consistency: Thin Diet NPO time specified Except for: [employer], Sips with Meds  EDUCATION NEEDS:   No education needs have been identified at this time  Skin:  Skin Assessment: Reviewed RN Assessment(L AKA)  Last BM:  02/19/2018  Height:   Ht Readings from Last 1 Encounters:  02/16/2018 6' (1.829 m)    Weight:   Wt Readings from Last 1 Encounters:  02/20/18 150 lb 3.2 oz (68.1 kg)    Ideal Body Weight:  74.03 kg  BMI:  Body mass index is 20.37 kg/m.  Estimated Nutritional Needs:   Kcal:  1900-2200 calories (MSJ x1.2-1.4)  Protein:  102-116 grams (1.5-1.7g/kg)  Fluid:  1.9-2.2L  Satira Anis. Charlie Char, MS, RD LDN Inpatient Clinical Dietitian Pager [phone]

## 2018-02-20 NOTE — Consult Note (Addendum)
Cardiology Consultation:   Patient ID: Aaron Dodson; [id number]; 10/30/1966   Admit date: 02/20/2018 Date of Consult: 02/20/2018  Primary Care Provider: Patient, No Pcp Per Primary Cardiologist: Dr. Marlou Porch    Patient Profile:   Aaron Dodson is a 52 y.o. male with a hx of Gable Odonohue is a 52 y.o. 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 Tana Coast.   He has numerous evaluations over the last several years with multiple cardiac caths both in the [facility] and also appearing in [place], as well as stress tests and CT angios to exclude PE. Last cath 12/2016 showed mild nonobstructive CAD with "Mild nonobstructive residual CAD with coronary calcification of the proximal LAD with 20% proximal stenosis, 20% mid stenosis, and a patent distal LAD stent with 10-15% intimal hyperplasia; widely patent stent extending just beyond the ostium of the circumflex vessel into the OM1 vessel with jailing of a small branch of this marginal vessel; and extensive stenting to a dominant RCA with mild 30% focal mid stent narrowing." Medical therapy recommended.  Last echo 11/2016 showed EF 55-60%, normal RV, no significant valvular abnormality.  Last seen by APP 04/2017  In clinic. Continued to have atypical chest pain. No work up warranted.    History of Present Illness:   Aaron. Dodson presented 02/16/2018 with fever and DOE. Admitted for spsis with strep viridans bacteremia with aortic valve endocarditis. Seen  by ID today and recommended imagining of brain to r/o CNS involvement given some neurologic changes. Cardiology asked for aortic valve vegetation and elevated troponin. He has intermittent chest pain at baseline but no exacerbating symptoms. Chronic baseline dyspnea due to sedentary lifestyle.   He has intermittent fever for about 1 month. Noted word finding difficulty as well as left arm weakness. During my conversation, he admitted to use "IV Molly". Wife find out this today.   Past Medical History:  Diagnosis Date  . ADD (attention deficit disorder)   . Anxiety   . Chronic chest pain   . Chronic lower back pain   . Club foot   . COPD (chronic obstructive pulmonary disease) ([place])   . Coronary artery disease    a. s/p multiple prior PCIs (LAD, LCx, OM1 - [place]; [place]). b. Multiple prior caths, last in 12/2016 - medical therapy, no new lesions, nonobstructive.  . Endocarditis 2013  . GERD (gastroesophageal reflux disease)   . History of echocardiogram    Echo 11/09/16: mild LVH, EF 55-60, no RWMA, Gr 1 DD, mildly dilated aortic root (38 mm)  . Hyperlipidemia   . Hypertension   . IV drug abuse ([place])   . Myocardial infarction [facility] 2000's - ~ 04/2015   "I've had a total of 3" (11/08/2016)  . PAF (paroxysmal atrial fibrillation) (HCC)    on Eliquis  . PTSD (post-traumatic stress disorder)   . Pulmonary embolism ([place])    "I've had 2 or 3" (11/08/2016)  . 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 Septic  pulmonary embolism ([place]) 2013  . Squamous cell carcinoma of scalp   . Stroke [facility]) ~ 2005-2006 X 2   denies residual on 11/08/2016    Past Surgical History:  Procedure Laterality Date  . ABOVE KNEE LEG AMPUTATION Left   . CARDIAC CATHETERIZATION  "several"  . CARDIAC CATHETERIZATION N/A 11/26/2015   Procedure: Left Heart Cath and Coronary Angiography;  Surgeon: Burnell Blanks, MD;  Location: [place] [facility];  Service: Cardiovascular;  Laterality: N/A;  .  CLUB FOOT RELEASE Left 07-25-67  . CORONARY ANGIOPLASTY  "several"  . CORONARY ANGIOPLASTY WITH STENT PLACEMENT  "several"   "total of 10 stents placed" (11/18/2015)  . LAPAROSCOPIC CHOLECYSTECTOMY    . LEFT HEART CATH AND CORONARY ANGIOGRAPHY N/A 12/20/2016   Procedure: Left Heart Cath and Coronary Angiography;  Surgeon: Troy Sine, MD;  Location: [facility] [facility];  Service: Cardiovascular;  Laterality: N/A;  . ORTHOPEDIC SURGERY  1967-02-18-~ 2007   "total of 52 on my left leg; started w/club foot released"  . TONSILLECTOMY       Inpatient Medications: Scheduled Meds: . nicotine  21 mg Transdermal Daily  . pantoprazole  40 mg Oral Daily   Continuous Infusions: . heparin 1,850 Units/hr (02/20/18 0851)  . pencillin G potassium IV     PRN Meds: acetaminophen, ALPRAZolam, cyclobenzaprine, loperamide, morphine injection, nitroGLYCERIN, ondansetron (ZOFRAN) IV, oxyCODONE, zolpidem  Allergies:    Allergies  Allergen Reactions  . Benadryl [Diphenhydramine Hcl] Other (See Comments)    Aggressive behavior  . Promethazine Hcl Other (See Comments)    Unknown - given for surgical procedure and advised to never take again  . Augmentin [Amoxicillin-Pot Clavulanate] Diarrhea and Nausea Only  . Toradol [Ketorolac Tromethamine] Hives and Other (See Comments)    Redness (also) Patient states he tolerated ibuprofen fine    Social History:   Social History   Socioeconomic History  . Marital status: Divorced    Spouse name: Not on file  . Number of children: Not on file  . Years of education: Not on file  . Highest education level: Not on file  Occupational History  . Not on file  Social Needs  . Financial resource strain: Not on file  . Food insecurity:    Worry: Not on file    Inability: Not on file  . Transportation needs:    Medical: Not on file    Non-medical: Not on file  Tobacco Use  . Smoking status: Current Every Day Smoker    Packs/day: 0.50    Years: 35.00    Pack  years: 17.50    Types: Cigarettes  . Smokeless tobacco: Never Used  Substance and Sexual Activity  . Alcohol use: Not Currently  . Drug use: Yes    Comment: Molly, last used  heroin 2.5 years ago  . Sexual activity: Not Currently  Lifestyle  . Physical activity:    Days per week: Not on file    Minutes per session: Not on file  . Stress: Not on file  Relationships  . Social connections:    Talks on phone: Not on file    Gets together: Not on file    Attends religious service: Not on file    Active member of club or organization: Not on file    Attends meetings of clubs or organizations: Not on file    Relationship status: Not on file  . Intimate partner violence:    Fear of current or ex partner: Not  on file    Emotionally abused: Not on file    Physically abused: Not on file    Forced sexual activity: Not on file  Other Topics Concern  . Not on file  Social History Narrative   Lives with ex wife by his report.  On disability.      Family History:   Family History  Problem Relation Age of Onset  . Hypertension Father   . Lung cancer Father   . Heart attack Father      ROS:  Please see the history of present illness.  All other ROS reviewed and negative.     Physical Exam/Data:   Vitals:   02/19/18 1322 02/19/18 1722 02/19/18 2024 02/20/18 0446  BP: (!) 98/58 124/61 (!) 117/58 (!) 112/54  Pulse: 97 (!) 101 99 (!) 109  Resp:  17 18 18   Temp:   97.8 F (36.6 C) 99 F (37.2 C)  TempSrc:   Oral Oral  SpO2: 98% 99% 98% 98%  Weight:    150 lb 3.2 oz (68.1 kg)  Height:        Intake/Output Summary (Last 24 hours) at 02/20/2018 1428 Last data filed at 02/20/2018 1100 Gross per 24 hour  Intake 1082 ml  Output 501 ml  Net 581 ml   Filed Weights   02/12/2018 2040 02/20/18 0446  Weight: 156 lb (70.8 kg) 150 lb 3.2 oz (68.1 kg)   Body mass index is 20.37 kg/m.  General:  Ill appearing male in no acute distress HEENT: normal Lymph: no adenopathy Neck: no  JVD Endocrine:  No thryomegaly Vascular: No carotid bruits; FA pulses 2+ bilaterally without bruits  Cardiac:  normal S1, S2; RRR; 3/6 murmur Lungs:  clear to auscultation bilaterally, no wheezing, rhonchi or rales  Abd: soft, nontender, no hepatomegaly  Ext: no edema Musculoskeletal: Left AKA Skin: warm and dry  Neuro: no focal abnormalities noted; left sided weakness Psych:  Normal affect   EKG:  The EKG was personally reviewed and demonstrates:  Sinus rhythm with PACs at rate of 106 bpm  Telemetry:  Telemetry was personally reviewed and demonstrates:  Sinus tachycardia at rate of 100s  Relevant CV Studies: Echo 02/19/18 Study Conclusions  - Left ventricle: The cavity size was normal. Wall thickness was   normal. Indeterminant diastolic function. Systolic function was   normal. The estimated ejection fraction was in the range of 55%   to 60%. Basal inferior akinesis. - Aortic valve: There was a large vegetation on the aortic valve.   Mild aortic stenosis, this may be created by vegetation. There   was probably severe regurgitation. There does appear to be   holodiastolic flow reversal in the descending thoracic aorta.   Mean gradient (S): 12 mm Hg. Valve area (VTI): 1.54 cm^2. - Mitral valve: There was mild regurgitation. - Left atrium: The atrium was mildly dilated. - Right ventricle: The cavity size was normal. Systolic function   was normal. - Pulmonary arteries: No complete TR doppler jet so unable to   estimate PA systolic pressure. - Systemic veins: IVC measured 2.4 cm with < 50% respirophasic   variation, suggesting RA pressure 8 mmHg.  Impressions:  - Normal LV size with EF 55-60%. Basal inferior akinesis. Normal RV   size and systolic function. There was a large vegetation on the   aortic valve. Aortic regurgitation appeared to be severe. No   other obvious vegetations. Mild Aaron. Suggest TEE to further   evaluate.  Cath 12/20/16  Left Heart Cath and Coronary  Angiography  Conclusion     Ost RCA to 4th RCA lesion, 10 %stenosed.  Mid RCA lesion, 30 %stenosed.  Dist LAD lesion, 15 %stenosed.  Ost LAD lesion, 20 %stenosed.  Mid LAD lesion, 20 %stenosed.  Ost 1st Mrg to 1st Mrg lesion, 0 %stenosed.  Ost Cx to Prox Cx lesion, 0 %stenosed.  The left ventricular systolic function is normal.  LV end diastolic pressure is normal.  The left ventricular ejection fraction is 55-65% by visual estimate.   Normal LV function with an ejection fraction of 55-60% without focal segmental wall motion abnormalities.  Mild nonobstructive residual CAD with coronary calcification of the proximal LAD with 20% proximal stenosis, 20% mid stenosis, and a patent distal LAD stent with 10-15% intimal hyperplasia; widely patent stent extending just beyond the ostium of the circumflex vessel into the OM1 vessel with jailing of a small branch of this marginal vessel; and extensive stenting to a dominant RCA with mild 30% focal mid stent narrowing.  RECOMMENDATION: Mediical therapy.     Laboratory Data:  Chemistry Recent Labs  Lab 02/18/18 0446 02/19/18 0439 02/20/18 0505  NA 132* 137 138  K 3.4* 3.4* 3.1*  CL 99* 108 107  CO2 21* 20* 22  GLUCOSE 102* 92 107*  BUN 13 6 <5*  CREATININE 1.39* 1.13 1.05  CALCIUM 9.1 9.1 9.6  GFRNONAA 57* >60 >60  GFRAA >60 >60 >60  ANIONGAP 12 9 9     Recent Labs  Lab 02/16/18 2147 02/13/2018 2220 02/19/18 0439  PROT 6.6 6.3* 5.7*  ALBUMIN 2.3* 2.3* 1.9*  AST 33 31 18  ALT 11* 12* 8*  ALKPHOS 78 85 62  BILITOT 0.7 0.7 0.5   Hematology Recent Labs  Lab 02/16/2018 2100 02/19/18 0439 02/20/18 0505  WBC 10.7* 8.7 10.8*  RBC 3.51* 2.91* 2.98*  HGB 10.4* 8.7* 8.8*  HCT 30.6* 25.7* 26.4*  MCV 87.2 88.3 88.6  MCH 29.6 29.9 29.5  MCHC 34.0 33.9 33.3  RDW 15.4 16.1* 16.6*  PLT 187 186 222   Cardiac Enzymes Recent Labs  Lab 02/18/18 0440 02/18/18 0935 02/18/18 2105 02/19/18 0439  TROPONINI 0.23* 0.18*  0.16* 0.25*    Recent Labs  Lab 03/04/2018 2110  TROPIPOC 0.26*    Radiology/Studies:  Dg Chest 2 View  Result Date: 02/19/2018 CLINICAL DATA:  Shortness of breath and fever EXAM: CHEST - 2 VIEW COMPARISON:  February 15, 2018 FINDINGS: There is no edema or consolidation. The heart size and pulmonary vascularity are normal. No adenopathy. There are multiple foci of coronary artery calcification. No bone lesions. IMPRESSION: Extensive coronary artery calcification.  No edema or consolidation. Electronically Signed   By: Lowella Grip III M.D.   On: 02/15/2018 21:25   Ct Angio Chest Pe W And/or Wo Contrast  Result Date: 02/16/2018 CLINICAL DATA:  Pericarditis, difficulty breathing with fevers. History of IVDA. EXAM: CT ANGIOGRAPHY CHEST WITH CONTRAST TECHNIQUE: Multidetector CT imaging of the chest was performed using the standard protocol during bolus administration of intravenous contrast. Multiplanar CT image reconstructions and MIPs were obtained to evaluate the vascular anatomy. CONTRAST:  152mL ISOVUE-370 IOPAMIDOL (ISOVUE-370) INJECTION 76% COMPARISON:  11/09/2016 CT FINDINGS: Cardiovascular: The study is of quality for the evaluation of pulmonary embolism. There are no filling defects in the central, lobar, segmental or subsegmental pulmonary artery branches to suggest acute pulmonary embolism. Great vessels are normal in course and caliber. Top-normal heart size. No significant pericardial fluid/thickening. Mediastinum/Nodes: No discrete thyroid  nodules. Unremarkable esophagus. No pathologically enlarged axillary, mediastinal or hilar lymph nodes. Lungs/Pleura: No pneumothorax. No pleural effusion. Stable 7 mm nodular density in the medial left upper lobe, series 6/20. Centrilobular emphysema without acute pneumonic consolidation. Bibasilar dependent atelectasis. Upper abdomen: Unremarkable. Musculoskeletal:  No aggressive appearing focal osseous lesions. Review of the MIP images confirms the  above findings. IMPRESSION: 1. No acute pulmonary embolus. 2. Centrilobular emphysema. 3. Stable 7 mm nodular density in the medial left lung apex. As this appears stable since 11/09/2016, additional follow-up at 18-24 months from the 11/09/2016 study is recommended in high risk patients which given patient's COPD and IVDA history, would be recommended. This recommendation follows the consensus statement: Guidelines for Management of Incidental Pulmonary Nodules Detected on CT Images: From the Fleischner Society 2017; Radiology 2017; 284:228-243. Emphysema (ICD10-J43.9). Electronically Signed   By: Ashley Royalty M.D.   On: 02/25/2018 23:15   Aaron Cervical Spine W Wo Contrast  Result Date: 02/18/2018 CLINICAL DATA:  Infective endocarditis. EXAM: MRI TOTAL SPINE WITHOUT AND WITH CONTRAST TECHNIQUE: Multisequence Aaron imaging of the spine from the cervical spine to the sacrum was performed prior to and following IV contrast administration. CONTRAST:  24mL MULTIHANCE GADOBENATE DIMEGLUMINE 529 MG/ML IV SOLN COMPARISON:  None. FINDINGS: MRI CERVICAL SPINE FINDINGS Alignment: Mild C3-4 anterolisthesis. Vertebrae: No fracture, evidence of discitis, or bone lesion.Diffuse hypointense marrow likely related to patient's anemia. Cord: Normal signal and morphology. Posterior Fossa, vertebral arteries, paraspinal tissues: No convincing inflammation. Disc levels: Shallow disc protrusions at C3-4, C5-6, and C6-7. No visible cord impingement. Limited assessment of foraminal narrowing due to motion. MRI THORACIC SPINE FINDINGS Alignment:  Normal. Vertebrae: No evidence of discitis, fracture, or aggressive lesion. Diffuse hypointense marrow likely related to patient's anemia. Cord:  Normal signal and morphology. Paraspinal and other soft tissues: No visible abscess or inflammation. Disc levels: Tiny T8-9 disc protrusion.  No impingement. MRI LUMBAR SPINE FINDINGS Segmentation:  5 lumbar type vertebral bodies. Alignment:  Normal.  Vertebrae: Diffusely hypointense marrow as above. No evidence of discitis, fracture, or bone lesion. Conus medullaris: Extends to the L1-2 level and appears normal. Paraspinal and other soft tissues: Nonspecific retroperitoneal and subcutaneous edema. No visible abscess or discrete inflammation. Disc levels: Good disc height and hydration.  Negative facets.  No impingement. There is pervasive motion artifact in this patient with pain. Pathologic findings could be obscured. IMPRESSION: 1. No visible discitis/osteomyelitis or abscess. No acute finding throughout the visualized spine. 2. Extensive motion degradation. Electronically Signed   By: Monte Fantasia M.D.   On: 02/18/2018 21:12   Aaron Thoracic Spine W Wo Contrast  Result Date: 02/18/2018 CLINICAL DATA:  Infective endocarditis. EXAM: MRI TOTAL SPINE WITHOUT AND WITH CONTRAST TECHNIQUE: Multisequence Aaron imaging of the spine from the cervical spine to the sacrum was performed prior to and following IV contrast administration. CONTRAST:  7mL MULTIHANCE GADOBENATE DIMEGLUMINE 529 MG/ML IV SOLN COMPARISON:  None. FINDINGS: MRI CERVICAL SPINE FINDINGS Alignment: Mild C3-4 anterolisthesis. Vertebrae: No fracture, evidence of discitis, or bone lesion.Diffuse hypointense marrow likely related to patient's anemia. Cord: Normal signal and morphology. Posterior Fossa, vertebral arteries, paraspinal tissues: No convincing inflammation. Disc levels: Shallow disc protrusions at C3-4, C5-6, and C6-7. No visible cord impingement. Limited assessment of foraminal narrowing due to motion. MRI THORACIC SPINE FINDINGS Alignment:  Normal. Vertebrae: No evidence of discitis, fracture, or aggressive lesion. Diffuse hypointense marrow likely related to patient's anemia. Cord:  Normal signal and morphology. Paraspinal and other soft tissues: No visible abscess  or inflammation. Disc levels: Tiny T8-9 disc protrusion.  No impingement. MRI LUMBAR SPINE FINDINGS Segmentation:  5 lumbar  type vertebral bodies. Alignment:  Normal. Vertebrae: Diffusely hypointense marrow as above. No evidence of discitis, fracture, or bone lesion. Conus medullaris: Extends to the L1-2 level and appears normal. Paraspinal and other soft tissues: Nonspecific retroperitoneal and subcutaneous edema. No visible abscess or discrete inflammation. Disc levels: Good disc height and hydration.  Negative facets.  No impingement. There is pervasive motion artifact in this patient with pain. Pathologic findings could be obscured. IMPRESSION: 1. No visible discitis/osteomyelitis or abscess. No acute finding throughout the visualized spine. 2. Extensive motion degradation. Electronically Signed   By: Monte Fantasia M.D.   On: 02/18/2018 21:12   Aaron Lumbar Spine W Wo Contrast  Result Date: 02/18/2018 CLINICAL DATA:  Infective endocarditis. EXAM: MRI TOTAL SPINE WITHOUT AND WITH CONTRAST TECHNIQUE: Multisequence Aaron imaging of the spine from the cervical spine to the sacrum was performed prior to and following IV contrast administration. CONTRAST:  73mL MULTIHANCE GADOBENATE DIMEGLUMINE 529 MG/ML IV SOLN COMPARISON:  None. FINDINGS: MRI CERVICAL SPINE FINDINGS Alignment: Mild C3-4 anterolisthesis. Vertebrae: No fracture, evidence of discitis, or bone lesion.Diffuse hypointense marrow likely related to patient's anemia. Cord: Normal signal and morphology. Posterior Fossa, vertebral arteries, paraspinal tissues: No convincing inflammation. Disc levels: Shallow disc protrusions at C3-4, C5-6, and C6-7. No visible cord impingement. Limited assessment of foraminal narrowing due to motion. MRI THORACIC SPINE FINDINGS Alignment:  Normal. Vertebrae: No evidence of discitis, fracture, or aggressive lesion. Diffuse hypointense marrow likely related to patient's anemia. Cord:  Normal signal and morphology. Paraspinal and other soft tissues: No visible abscess or inflammation. Disc levels: Tiny T8-9 disc protrusion.  No impingement. MRI LUMBAR  SPINE FINDINGS Segmentation:  5 lumbar type vertebral bodies. Alignment:  Normal. Vertebrae: Diffusely hypointense marrow as above. No evidence of discitis, fracture, or bone lesion. Conus medullaris: Extends to the L1-2 level and appears normal. Paraspinal and other soft tissues: Nonspecific retroperitoneal and subcutaneous edema. No visible abscess or discrete inflammation. Disc levels: Good disc height and hydration.  Negative facets.  No impingement. There is pervasive motion artifact in this patient with pain. Pathologic findings could be obscured. IMPRESSION: 1. No visible discitis/osteomyelitis or abscess. No acute finding throughout the visualized spine. 2. Extensive motion degradation. Electronically Signed   By: Monte Fantasia M.D.   On: 02/18/2018 21:12   Assessment and Plan:   1. Aortic valve endocarditis with severe regurgitation - Due to strep viridans bacteremia from using  "IV Molly". He has denies IV drug usage since admit.  - TEE tomorrow  2. Elevated troponin  - Flat trend 0.23>>0.18>>0.16>>0.25. No excerebration of chest pain. EKG without acute changes. He is not a candidate for invasive evaluation. Will start him on ASA 81mg  qd (he was on ASA and plavix during last office visit). Continue IV heparin.   3. CAD - Last cath in 12/2016 showed Mild nonobstructive residual CAD with patent stent.  - Check lipid panel. ADD statin.  4. Bacteremia with possible CNS involvement - Pending Brain eval. Per ID.   For questions or updates, please contact [place] Please consult [email or web address] for contact info under Cardiology/STEMI.   Mahalia Longest [place], [place]  02/20/2018 2:28 PM    The patient was seen, examined and discussed with Bhagat,Bhavinkumar PA-C and I agree with the above.   52 y.o. 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 Tana Coast.  The patient had the last cath in 12/2016 showed mild to moderate nonobstructive CAD with "Mild nonobstructive residual CAD with coronary calcification of the proximal LAD with 20% proximal stenosis, 20% mid stenosis, and a patent distal LAD stent with 10-15% intimal hyperplasia; widely patent stent extending just beyond the ostium of the circumflex vessel into the OM1 vessel with jailing of a small branch of this marginal vessel; and extensive stenting to a dominant RCA with mild 30% focal mid stent narrowing." Medical therapy recommended. Last echo 11/2016 showed EF 55-60%, normal RV, no significant valvular abnormality.  He presented with on 03/01/2018 with fever and DOE. Admitted for sepsis with strep viridans bacteremia with aortic valve endocarditis. Seen by ID today and recommended imagining of brain to r/o CNS involvement given some neurologic changes. Echo shows a large vegetation of his aortic valve. Troponin elevated 0.16->0.25.   Plan: Continue iv ATB per ID recommendations.  Follow brain CT for evaluation of septic emboli. Consult surgery if positive, however doubt he is a surgical candidate given chronic IVDA, non-compliance, polymorbidity. Follow troponin trend, if he undergoes AVR, he would need a cath, however high risk given large vegetation and septic emboli. CT not an option given prior stents.  He doesn't appear fluid overloaded on physical exam.   Ena Dawley, MD 02/20/2018

## 2018-02-21 ENCOUNTER — Inpatient Hospital Stay (HOSPITAL_COMMUNITY): Payer: Medicare HMO | Admitting: Certified Registered"

## 2018-02-21 ENCOUNTER — Inpatient Hospital Stay (HOSPITAL_COMMUNITY): Payer: Medicare HMO

## 2018-02-21 ENCOUNTER — Encounter (HOSPITAL_COMMUNITY): Payer: Self-pay | Admitting: Certified Registered"

## 2018-02-21 ENCOUNTER — Encounter (HOSPITAL_COMMUNITY): Admission: EM | Disposition: E | Payer: Self-pay | Source: Home / Self Care | Attending: Internal Medicine

## 2018-02-21 DIAGNOSIS — F1990 Other psychoactive substance use, unspecified, uncomplicated: Secondary | ICD-10-CM

## 2018-02-21 DIAGNOSIS — R7881 Bacteremia: Secondary | ICD-10-CM

## 2018-02-21 DIAGNOSIS — I358 Other nonrheumatic aortic valve disorders: Secondary | ICD-10-CM

## 2018-02-21 DIAGNOSIS — I339 Acute and subacute endocarditis, unspecified: Secondary | ICD-10-CM

## 2018-02-21 DIAGNOSIS — I351 Nonrheumatic aortic (valve) insufficiency: Secondary | ICD-10-CM

## 2018-02-21 DIAGNOSIS — I2510 Atherosclerotic heart disease of native coronary artery without angina pectoris: Secondary | ICD-10-CM

## 2018-02-21 HISTORY — PX: TEE WITHOUT CARDIOVERSION: SHX5443

## 2018-02-21 LAB — HIV 1/2 AB DIFFERENTIATION
HIV 1 Ab: NEGATIVE
HIV 2 Ab: NEGATIVE
Note: NEGATIVE

## 2018-02-21 LAB — LIPID PANEL
CHOL/HDL RATIO: 7.9 ratio
CHOLESTEROL: 118 mg/dL (ref 0–200)
HDL: 15 mg/dL — AB (ref >40)
LDL Cholesterol: 79 mg/dL (ref 0–99)
TRIGLYCERIDES: 122 mg/dL (ref <150)
VLDL: 24 mg/dL (ref 0–40)

## 2018-02-21 LAB — HIV ANTIBODY (ROUTINE TESTING W REFLEX): HIV Screen 4th Generation wRfx: REACTIVE — AB

## 2018-02-21 LAB — MAGNESIUM: MAGNESIUM: 1.4 mg/dL — AB (ref 1.7–2.4)

## 2018-02-21 LAB — RNA QUALITATIVE: HIV 1 RNA Qualitative: 1

## 2018-02-21 LAB — BASIC METABOLIC PANEL
Anion gap: 10 (ref 5–15)
BUN: 6 mg/dL (ref 6–20)
CO2: 18 mmol/L — ABNORMAL LOW (ref 22–32)
Calcium: 8.8 mg/dL — ABNORMAL LOW (ref 8.9–10.3)
Chloride: 106 mmol/L (ref 101–111)
Creatinine, Ser: 1.08 mg/dL (ref 0.61–1.24)
GFR calc Af Amer: >60 mL/min (ref >60)
Glucose, Bld: 133 mg/dL — ABNORMAL HIGH (ref 65–99)
Potassium: 3.6 mmol/L (ref 3.5–5.1)
SODIUM: 134 mmol/L — AB (ref 135–145)

## 2018-02-21 LAB — HEPARIN LEVEL (UNFRACTIONATED): Heparin Unfractionated: 0.47 IU/mL (ref 0.30–0.70)

## 2018-02-21 SURGERY — ECHOCARDIOGRAM, TRANSESOPHAGEAL
Anesthesia: Monitor Anesthesia Care

## 2018-02-21 MED ORDER — SODIUM CHLORIDE 0.9 % IV BOLUS: 1000.0000 mL | Freq: Once | INTRAVENOUS | Status: AC

## 2018-02-21 MED ORDER — DEXMEDETOMIDINE HCL 200 MCG/2ML IV SOLN
INTRAVENOUS | Status: DC | PRN
  Administered 2018-02-21: 4 ug via INTRAVENOUS
  Administered 2018-02-21 (×2): 8 ug via INTRAVENOUS
  Administered 2018-02-21 (×2): 4 ug via INTRAVENOUS
  Administered 2018-02-21: 8 ug via INTRAVENOUS
  Administered 2018-02-21: 4 ug via INTRAVENOUS

## 2018-02-21 MED ORDER — SODIUM CHLORIDE 0.9 % IV SOLN
INTRAVENOUS | Status: DC
  Administered 2018-02-21: 09:00:00 via INTRAVENOUS

## 2018-02-21 MED ORDER — FUROSEMIDE 10 MG/ML IJ SOLN
20.0000 mg | Freq: Once | INTRAMUSCULAR | Status: AC
  Administered 2018-02-21: 20 mg via INTRAVENOUS
  Filled 2018-02-21: qty 2

## 2018-02-21 MED ORDER — PROPOFOL 500 MG/50ML IV EMUL
INTRAVENOUS | Status: DC | PRN
  Administered 2018-02-21: 150 ug/kg/min via INTRAVENOUS

## 2018-02-21 MED ORDER — IPRATROPIUM-ALBUTEROL 0.5-2.5 (3) MG/3ML IN SOLN
3.0000 mL | Freq: Four times a day (QID) | RESPIRATORY_TRACT | Status: DC
  Administered 2018-02-21 – 2018-02-23 (×5): 3 mL via RESPIRATORY_TRACT
  Filled 2018-02-21 (×5): qty 3

## 2018-02-21 MED ORDER — PHENYLEPHRINE 40 MCG/ML (10ML) SYRINGE FOR IV PUSH (FOR BLOOD PRESSURE SUPPORT)
PREFILLED_SYRINGE | INTRAVENOUS | Status: DC | PRN
  Administered 2018-02-21: 40 ug via INTRAVENOUS
  Administered 2018-02-21: 80 ug via INTRAVENOUS
  Administered 2018-02-21: 40 ug via INTRAVENOUS

## 2018-02-21 MED ORDER — SODIUM CHLORIDE 0.9 % IV SOLN
INTRAVENOUS | Status: DC
  Administered 2018-02-21 – 2018-02-23 (×2): via INTRAVENOUS

## 2018-02-21 MED ORDER — FUROSEMIDE 10 MG/ML IJ SOLN
INTRAMUSCULAR | Status: AC
  Filled 2018-02-21: qty 2

## 2018-02-21 MED ORDER — BUTAMBEN-TETRACAINE-BENZOCAINE 2-2-14 % EX AERO
INHALATION_SPRAY | CUTANEOUS | Status: DC | PRN
  Administered 2018-02-21: 2 via TOPICAL

## 2018-02-21 MED ORDER — LIDOCAINE HCL (CARDIAC) 20 MG/ML IV SOLN
INTRAVENOUS | Status: DC | PRN
  Administered 2018-02-21: 60 mg via INTRATRACHEAL

## 2018-02-21 MED ORDER — SODIUM CHLORIDE 0.9 % IV BOLUS
1000.0000 mL | Freq: Once | INTRAVENOUS | Status: AC
  Administered 2018-02-21: 1000 mL via INTRAVENOUS

## 2018-02-21 MED ORDER — ALBUTEROL SULFATE (2.5 MG/3ML) 0.083% IN NEBU
2.5000 mg | INHALATION_SOLUTION | RESPIRATORY_TRACT | Status: DC | PRN
Start: 2018-02-21 — End: 2018-02-23

## 2018-02-21 MED ORDER — IPRATROPIUM-ALBUTEROL 0.5-2.5 (3) MG/3ML IN SOLN
RESPIRATORY_TRACT | Status: AC
  Administered 2018-02-21: 3 mL via RESPIRATORY_TRACT
  Filled 2018-02-21: qty 3

## 2018-02-21 MED ORDER — FUROSEMIDE 10 MG/ML IJ SOLN
20.0000 mg | Freq: Once | INTRAMUSCULAR | Status: AC
  Administered 2018-02-21: 20 mg via INTRAVENOUS

## 2018-02-21 NOTE — Progress Notes (Signed)
Patient arrived from 5W presenting with tachypnea, coarse breath sounds. Patient is extremely short of breath with retractions, tachypneic with rate in 30s. MD paged, ordered 20 of Lasix and STAT chest xray.

## 2018-02-21 NOTE — Progress Notes (Signed)
Call placed to Dr. Tana Coast, advised bed on 2C unavailable at this time. Per Dr. Tana Coast patient will return to 5W as stepdown bed while awaiting 2C availability. Call placed to Jocelyn Lamer, RN, charge nurse for 5W and advised of this plan.

## 2018-02-21 NOTE — Progress Notes (Signed)
ANTICOAGULATION CONSULT NOTE - Follow Up Consult  Pharmacy Consult for Heparin Indication: chest pain/ACS  Allergies  Allergen Reactions  . Benadryl [Diphenhydramine Hcl] Other (See Comments)    Aggressive behavior  . Promethazine Hcl Other (See Comments)    Unknown - given for surgical procedure and advised to never take again  . Augmentin [Amoxicillin-Pot Clavulanate] Diarrhea and Nausea Only  . Toradol [Ketorolac Tromethamine] Hives and Other (See Comments)    Redness (also) Patient states he tolerated ibuprofen fine    Patient Measurements: Height: 6' (182.9 cm) Weight: 148 lb 9.4 oz (67.4 kg) IBW/kg (Calculated) : 77.6 Heparin Dosing Weight:  68.1 kg  Vital Signs: Temp: 99 F (37.2 C) (04/16 0822) Temp Source: Oral (04/16 0822) BP: 109/38 (04/16 0822) Pulse Rate: 95 (04/16 0822)  Labs: Recent Labs    02/18/18 0935 02/18/18 2105  02/19/18 0439 02/20/18 0505 02/22/2018 0349  HGB  --   --   --  8.7* 8.8*  --   HCT  --   --   --  25.7* 26.4*  --   PLT  --   --   --  186 222  --   HEPARINUNFRC 0.11*  --    < > 0.32 0.30 0.47  CREATININE  --   --   --  1.13 1.05  --   TROPONINI 0.18* 0.16*  --  0.25*  --   --    < > = values in this interval not displayed.    Estimated Creatinine Clearance: 79.3 mL/min (by C-G formula based on SCr of 1.05 mg/dL).  Assessment:  Anticoag: Heparin for ACS ( IE) +troponins- no bolus per ED provider.  Heparin level 0.47. Hgb 8.7 low but stable. Plts 222 ok. No CBC today - (Note h/o afib with CVA and h/o multiple PE's, NOT on anticoag PTA)  Goal of Therapy:  Heparin level 0.3-0.7 units/ml Monitor platelets by anticoagulation protocol: Yes   Plan:  Continue heparin at 1850 units/hr  F/u AM heparin level and CBC TEE 4/16   Cherrell Maybee S. Alford Highland, PharmD, BCPS Clinical Staff Pharmacist Pager [phone]  Eilene Ghazi Stillinger 02/16/2018,8:49 AM

## 2018-02-21 NOTE — Consult Note (Addendum)
[address]       [place] [address]             [phone]          CARDIOTHORACIC SURGERY CONSULTATION REPORT  PCP is Patient, No Pcp Per Referring Provider is Ena Dawley, MD Primary Cardiologist is Candee Furbish, MD Infectious Disease Consultant is Tommy Medal, Lavell Islam, MD  Reason for consultation:  Aortic valve endocarditis  HPI:  Patient is a 52 year old male with complex past medical history notable for IV drug abuse with bacterial endocarditis treated with medical therapy in the past, coronary artery disease with multiple previous myocardial infarctions treated using PCI and stenting, chronic pain, previous left above-knee amputation, previous stroke, previous pulmonary embolus, reported history of atrial fibrillation not on anticoagulation, COPD with ongoing tobacco abuse, posttraumatic stress disorder, attention deficit disorder, and chronic anxiety who has been referred for surgical consultation to discuss treatment options for management of 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  Patient has long-standing history of coronary artery disease with reportedly a total of 3 acute myocardial infarctions in the past treated with PCI and stenting.  At the time the patient lived in [place] and/or [place].  He has undergone PCI and stenting on multiple occasions reportedly including the left anterior descending coronary artery, right coronary artery, the left circumflex coronary artery, and the obtuse marginal branch of the left circumflex coronary artery.  In 2013 he was hospitalized for bacterial endocarditis.  He was treated medically with a prolonged course of intravenous antibiotics and recovered.  His most recent catheterization was February 2018 at which time his stents remain patent with moderate nonobstructive disease.  Medical therapy was recommended.  Echocardiogram  performed at that time revealed normal left ventricular systolic function with ejection fraction estimated 55-60%.  There was no significant valvular disease appreciated.    Patient states he was in his usual state of health until several weeks ago when he began to use intravenous drugs Cloyde Reams) he states that shortly after that he started feeling poorly.  He noticed increased fatigue and low-grade fevers.  He developed worsening shortness of breath.  He eventually presented to the hospital for admission February 17, 2018 with fever and severe exertional shortness of breath.  Troponin levels were mildly elevated but remained flat.  EKGs were not consistent with coronary ischemia.  Blood cultures grew Streptococcus viridans.  Transthoracic echocardiogram revealed normal left ventricular systolic function.  There appeared to be a large vegetation on the aortic valve.  There was severe aortic insufficiency.  Transesophageal echocardiogram was performed earlier today and confirmed the presence of what appeared to be vegetation on the aortic valve.  The aortic valve was functionally bicuspid (Sievers type I bicuspid aortic valve).  There was severe aortic insufficiency.  Cardiothoracic surgical consultation was requested.  Patient is married and has lived locally in [place] since he got out of prison approximately 2 years ago.  He has a remote history of IV heroin abuse but states that he has been clean from heroin for more than 3 years.  He admits that recently he restarted using IV drugs when a friend introduced him to [employer]".  He has on long-term disability because of previous amputation of his left lower extremity.  He ambulates on a prosthesis without any for a cane or other form of stability.  He drives an automobile.  He admits to some degree of long-standing symptoms  of exertional shortness of breath, but symptoms were relatively mild and reasonably stable until recently.  At the time of admission he had  severe exertional shortness of breath and a persistent nonproductive cough.  He has severely decreased energy and feels very weak.  His appetite is poor.  He has pain in his back which is chronic and he states unchanged from his typical pain.  He has had some headaches and mild blurry vision.  He has not had transient monocular blindness.  He denies abdominal pain.  He has not had orthopnea or lower extremity edema.  Past Medical History:  Diagnosis Date  . ADD (attention deficit disorder)   . Anxiety   . Chronic chest pain   . Chronic lower back pain   . Club foot   . COPD (chronic obstructive pulmonary disease) ([place])   . Coronary artery disease    a. s/p multiple prior PCIs (LAD, LCx, OM1 - [place]; [place]). b. Multiple prior caths, last in 12/2016 - medical therapy, no new lesions, nonobstructive.  . Endocarditis 2013  . GERD (gastroesophageal reflux disease)   . History of echocardiogram    Echo 11/09/16: mild LVH, EF 55-60, no RWMA, Gr 1 DD, mildly dilated aortic root (38 mm)  . Hyperlipidemia   . Hypertension   . IV drug abuse ([place])   . Myocardial infarction [facility]) 2000's - ~ 04/2015   "I've had a total of 3" (11/08/2016)  . PAF (paroxysmal atrial fibrillation) ([facility])    on Eliquis  . PTSD (post-traumatic stress disorder)   . Pulmonary embolism ([place])    "I've had 2 or 3" (11/08/2016)  . 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 Septic pulmonary embolism ([place]) 2013  . Squamous cell carcinoma of scalp   . Stroke [facility]) ~ 2005-2006 X 2   denies residual on 11/08/2016    Past Surgical History:  Procedure Laterality Date  . ABOVE KNEE LEG AMPUTATION Left   . CARDIAC CATHETERIZATION  "several"  . CARDIAC CATHETERIZATION N/A 11/26/2015   Procedure: Left Heart Cath and Coronary Angiography;  Surgeon: Burnell Blanks, MD;  Location: [facility];  Service: Cardiovascular;  Laterality: N/A;  . CLUB FOOT RELEASE Left 1967/08/11  . CORONARY ANGIOPLASTY  "several"    . CORONARY ANGIOPLASTY WITH STENT PLACEMENT  "several"   "total of 10 stents placed" (11/18/2015)  . LAPAROSCOPIC CHOLECYSTECTOMY    . LEFT HEART CATH AND CORONARY ANGIOGRAPHY N/A 12/20/2016   Procedure: Left Heart Cath and Coronary Angiography;  Surgeon: Troy Sine, MD;  Location: [facility];  Service: Cardiovascular;  Laterality: N/A;  . ORTHOPEDIC SURGERY  06/04/1967-~ 2007   "total of 52 on my left leg; started w/club foot released"  . TONSILLECTOMY      Family History  Problem Relation Age of Onset  . Hypertension Father   . Lung cancer Father   . Heart attack Father     Social History   Socioeconomic History  . Marital status: Divorced    Spouse name: Not on file  . Number of children: Not on file  . Years of education: Not on file  . Highest education level: Not on file  Occupational History  . Not on file  Social Needs  . Financial resource strain: Not on file  . Food insecurity:    Worry: Not on file    Inability: Not on file  . Transportation needs:    Medical: Not on  file    Non-medical: Not on file  Tobacco Use  . Smoking status: Current Every Day Smoker    Packs/day: 0.50    Years: 35.00    Pack years: 17.50    Types: Cigarettes  . Smokeless tobacco: Never Used  Substance and Sexual Activity  . Alcohol use: Not Currently  . Drug use: Yes    Comment: Molly, last used  heroin 2.5 years ago  . Sexual activity: Not Currently  Lifestyle  . Physical activity:    Days per week: Not on file    Minutes per session: Not on file  . Stress: Not on file  Relationships  . Social connections:    Talks on phone: Not on file    Gets together: Not on file    Attends religious service: Not on file    Active member of club or organization: Not on file    Attends meetings of clubs or organizations: Not on file    Relationship status: Not on file  . Intimate partner violence:    Fear of current or ex partner: Not on file    Emotionally abused: Not on  file    Physically abused: Not on file    Forced sexual activity: Not on file  Other Topics Concern  . Not on file  Social History Narrative   Lives with ex wife by his report.  On disability.      Prior to Admission medications   Medication Sig Start Date End Date Taking? Authorizing Provider  acetaminophen (TYLENOL) 325 MG tablet Take 2 tablets (650 mg total) by mouth every 4 (four) hours as needed for headache or mild pain. 12/20/16  Yes Isaiah Serge, NP  aspirin-acetaminophen-caffeine (EXCEDRIN MIGRAINE) [phone] MG tablet Take 2 tablets by mouth every 6 (six) hours as needed for headache.   Yes [provider]  cyclobenzaprine (FLEXERIL) 10 MG tablet Take 10 mg by mouth 3 (three) times daily as needed for muscle spasms.  03/10/17  Yes [provider]  ibuprofen (ADVIL,MOTRIN) 200 MG tablet Take 1,400 mg by mouth daily as needed for moderate pain.   Yes [provider]  loperamide (IMODIUM A-D) 2 MG tablet Take 2-4 mg by mouth 4 (four) times daily as needed for diarrhea or loose stools.   Yes [provider]  nitroGLYCERIN (NITROSTAT) 0.4 MG SL tablet Place 1 tablet (0.4 mg total) under the tongue every 5 (five) minutes x 3 doses as needed for chest pain. 12/20/16  Yes Isaiah Serge, NP    Current Facility-Administered Medications  Medication Dose Route Frequency Provider Last Rate Last Dose  . 0.9 %  sodium chloride infusion   Intravenous Continuous Rai, Ripudeep K, MD   Stopped at 02/12/2018 1442  . acetaminophen (TYLENOL) tablet 650 mg  650 mg Oral Q4H PRN Jerline Pain, MD   650 mg at 02/18/2018 0600  . albuterol (PROVENTIL) (2.5 MG/3ML) 0.083% nebulizer solution 2.5 mg  2.5 mg Nebulization Q4H PRN Sampson Goon, MD      . ALPRAZolam Duanne Moron) tablet 0.25 mg  0.25 mg Oral BID PRN Jerline Pain, MD   0.25 mg at 02/20/18 0023  . aspirin EC tablet 81 mg  81 mg Oral Daily Jerline Pain, MD   81 mg at 02/27/2018 1241  . atorvastatin (LIPITOR) tablet 40  mg  40 mg Oral q1800 Jerline Pain, MD   40 mg at 02/25/2018 1711  . cyclobenzaprine (FLEXERIL) tablet 10 mg  10 mg Oral TID PRN Jerline Pain, MD   10 mg at 02/18/18 0835  . feeding supplement (ENSURE ENLIVE) (ENSURE ENLIVE) liquid 237 mL  237 mL Oral BID BM Skains, Mark C, MD      . heparin ADULT infusion 100 units/mL (25000 units/260mL sodium chloride 0.45%)  1,850 Units/hr Intravenous Continuous Jerline Pain, MD 18.5 mL/hr at 02/16/2018 1500 1,850 Units/hr at 02/12/2018 1500  . ipratropium-albuterol (DUONEB) 0.5-2.5 (3) MG/3ML nebulizer solution 3 mL  3 mL Nebulization Q6H Sampson Goon, MD   3 mL at 02/13/2018 1507  . loperamide (IMODIUM) capsule 2-4 mg  2-4 mg Oral QID PRN Jerline Pain, MD      . morphine 4 MG/ML injection 2-4 mg  2-4 mg Intravenous Q4H PRN Jerline Pain, MD   4 mg at 03/01/2018 0515  . nicotine (NICODERM CQ - dosed in mg/24 hours) patch 21 mg  21 mg Transdermal Daily Jerline Pain, MD   21 mg at 02/22/2018 1238  . nitroGLYCERIN (NITROSTAT) SL tablet 0.4 mg  0.4 mg Sublingual Q5 Min x 3 PRN Jerline Pain, MD      . ondansetron (ZOFRAN) injection 4 mg  4 mg Intravenous Q6H PRN Jerline Pain, MD   4 mg at 02/18/18 0527  . oxyCODONE (Oxy IR/ROXICODONE) immediate release tablet 5-10 mg  5-10 mg Oral Q4H PRN Jerline Pain, MD   10 mg at 02/18/2018 1545  . pantoprazole (PROTONIX) EC tablet 40 mg  40 mg Oral Daily Jerline Pain, MD   40 mg at 03/04/2018 1240  . penicillin G potassium 4 Million Units in dextrose 5 % 250 mL IVPB  4 Million Units Intravenous Q4H Jerline Pain, MD   Stopped at 02/10/2018 1832  . zolpidem (AMBIEN) tablet 5 mg  5 mg Oral QHS PRN,MR X 1 Skains, Thana Farr, MD        Allergies  Allergen Reactions  . Benadryl [Diphenhydramine Hcl] Other (See Comments)    Aggressive behavior  . Promethazine Hcl Other (See Comments)    Unknown - given for surgical procedure and advised to never take again  . Augmentin [Amoxicillin-Pot Clavulanate] Diarrhea and Nausea Only  .  Toradol [Ketorolac Tromethamine] Hives and Other (See Comments)    Redness (also) Patient states he tolerated ibuprofen fine      Review of Systems:   General:  poor appetite, decreased energy, no weight gain, no weight loss, + fever  Cardiac:  no chest pain with exertion, no chest pain at rest, + SOB with exertion, + intermittent resting SOB, no PND, no orthopnea, no palpitations, no arrhythmia, no atrial fibrillation, no LE edema, no dizzy spells, no syncope  Respiratory:  + shortness of breath, no home oxygen, no productive cough, + persistent dry cough, no bronchitis, no wheezing, no hemoptysis, no asthma, no pain with inspiration or cough, no sleep apnea, no CPAP at night  GI:   no difficulty swallowing, no reflux, no frequent heartburn, no hiatal hernia, no abdominal pain, no constipation, no diarrhea, no hematochezia, no hematemesis, no melena  GU:   + dysuria,  + frequency, no urinary tract infection, no hematuria, no enlarged prostate, no kidney stones, no kidney disease  Vascular:  no pain suggestive of claudication, no pain in feet, no leg cramps, no varicose veins, no DVT, no non-healing foot ulcer  Neuro:   + stroke, no TIA's, no seizures, + headaches, no temporary blindness one eye,  no slurred speech, +  peripheral neuropathy, + chronic pain, + instability of gait, no memory/cognitive dysfunction  Musculoskeletal: no arthritis , no joint swelling, no myalgias, + difficulty walking, limited mobility   Skin:   no rash, no itching, no skin infections, no pressure sores or ulcerations  Psych:   + anxiety, no depression, + nervousness, no unusual recent stress  Eyes:   + blurry vision, no floaters, no recent vision changes, + wears glasses or contacts  ENT:   no hearing loss, no loose or painful teeth, edentulous w/ full dentures  Hematologic:  no easy bruising, no abnormal bleeding, no clotting disorder, no frequent epistaxis  Endocrine:  no diabetes, does not check CBG's at  [facility]     Physical Exam:   BP (!) 95/44   Pulse 100   Temp 97.8 F (36.6 C) (Axillary)   Resp (!) 27   Ht 6' (1.829 m)   Wt 148 lb 9.4 oz (67.4 kg)   SpO2 97%   BMI 20.15 kg/m   General:  Chronically ill-appearing in NAD  HEENT:  Unremarkable   Neck:   no JVD, no bruits, no adenopathy   Chest:   clear to auscultation, symmetrical breath sounds, no wheezes, no rhonchi   CV:   RRR, grade III/VI diastolic murmur   Abdomen:  soft, non-tender, no masses   Extremities:  warm, well-perfused, pulses palpable, no lower extremity edema  Rectal/GU  Deferred  Neuro:   Grossly non-focal and symmetrical throughout  Skin:   Clean and dry, no rashes, no breakdown  Diagnostic Tests:  Lab Results: Recent Labs    02/19/18 0439 02/20/18 0505  WBC 8.7 10.8*  HGB 8.7* 8.8*  HCT 25.7* 26.4*  PLT 186 222   BMET:  Recent Labs    02/20/18 0505 02/20/2018 1530  NA 138 134*  K 3.1* 3.6  CL 107 106  CO2 22 18*  GLUCOSE 107* 133*  BUN <5* 6  CREATININE 1.05 1.08  CALCIUM 9.6 8.8*    CBG (last 3)  No results for input(s): GLUCAP in the last 72 hours. PT/INR:  No results for input(s): LABPROT, INR in the last 72 hours.  CXR:  CHEST - 2 VIEW  COMPARISON:  February 15, 2018  FINDINGS: There is no edema or consolidation. The heart size and pulmonary vascularity are normal. No adenopathy. There are multiple foci of coronary artery calcification. No bone lesions.  IMPRESSION: Extensive coronary artery calcification.  No edema or consolidation.   Electronically Signed   By: Lowella Grip III M.D.   On: 02/12/2018 21:25    CT ANGIOGRAPHY CHEST WITH CONTRAST  TECHNIQUE: Multidetector CT imaging of the chest was performed using the standard protocol during bolus administration of intravenous contrast. Multiplanar CT image reconstructions and MIPs were obtained to evaluate the vascular anatomy.  CONTRAST:  175mL ISOVUE-370 IOPAMIDOL (ISOVUE-370) INJECTION  76%  COMPARISON:  11/09/2016 CT  FINDINGS: Cardiovascular: The study is of quality for the evaluation of pulmonary embolism. There are no filling defects in the central, lobar, segmental or subsegmental pulmonary artery branches to suggest acute pulmonary embolism. Great vessels are normal in course and caliber. Top-normal heart size. No significant pericardial fluid/thickening.  Mediastinum/Nodes: No discrete thyroid nodules. Unremarkable esophagus. No pathologically enlarged axillary, mediastinal or hilar lymph nodes.  Lungs/Pleura: No pneumothorax. No pleural effusion. Stable 7 mm nodular density in the medial left upper lobe, series 6/20. Centrilobular emphysema without acute pneumonic consolidation. Bibasilar dependent atelectasis.  Upper abdomen: Unremarkable.  Musculoskeletal:  No aggressive appearing  focal osseous lesions.  Review of the MIP images confirms the above findings.  IMPRESSION: 1. No acute pulmonary embolus. 2. Centrilobular emphysema. 3. Stable 7 mm nodular density in the medial left lung apex. As this appears stable since 11/09/2016, additional follow-up at 18-24 months from the 11/09/2016 study is recommended in high risk patients which given patient's COPD and IVDA history, would be recommended. This recommendation follows the consensus statement: Guidelines for Management of Incidental Pulmonary Nodules Detected on CT Images: From the Fleischner Society 2017; Radiology 2017; 284:228-243.  Emphysema (ICD10-J43.9).   Electronically Signed   By: Ashley Royalty M.D.   On: 02/13/2018 23:15   MRI TOTAL SPINE WITHOUT AND WITH CONTRAST  TECHNIQUE: Multisequence MR imaging of the spine from the cervical spine to the sacrum was performed prior to and following IV contrast administration.  CONTRAST:  84mL MULTIHANCE GADOBENATE DIMEGLUMINE 529 MG/ML IV SOLN  COMPARISON:  None.  FINDINGS: MRI CERVICAL SPINE FINDINGS  Alignment:  Mild C3-4 anterolisthesis.  Vertebrae: No fracture, evidence of discitis, or bone lesion.Diffuse hypointense marrow likely related to patient's anemia.  Cord: Normal signal and morphology.  Posterior Fossa, vertebral arteries, paraspinal tissues: No convincing inflammation.  Disc levels:  Shallow disc protrusions at C3-4, C5-6, and C6-7. No visible cord impingement. Limited assessment of foraminal narrowing due to motion.  MRI THORACIC SPINE FINDINGS  Alignment:  Normal.  Vertebrae: No evidence of discitis, fracture, or aggressive lesion. Diffuse hypointense marrow likely related to patient's anemia.  Cord:  Normal signal and morphology.  Paraspinal and other soft tissues: No visible abscess or inflammation.  Disc levels:  Tiny T8-9 disc protrusion.  No impingement.  MRI LUMBAR SPINE FINDINGS  Segmentation:  5 lumbar type vertebral bodies.  Alignment:  Normal.  Vertebrae: Diffusely hypointense marrow as above. No evidence of discitis, fracture, or bone lesion.  Conus medullaris: Extends to the L1-2 level and appears normal.  Paraspinal and other soft tissues: Nonspecific retroperitoneal and subcutaneous edema. No visible abscess or discrete inflammation.  Disc levels:  Good disc height and hydration.  Negative facets.  No impingement.  There is pervasive motion artifact in this patient with pain. Pathologic findings could be obscured.  IMPRESSION: 1. No visible discitis/osteomyelitis or abscess. No acute finding throughout the visualized spine. 2. Extensive motion degradation.   Electronically Signed   By: Monte Fantasia M.D.   On: 02/18/2018 21:12    Transthoracic Echocardiography  (Report amended )  Patient:    Zephan, Beauchaine MR #:       [id number] Study Date: 02/19/2018 Gender:     M Age:        41 Height:     182.9 cm Weight:     70.8 kg BSA:        1.89 m^2 Pt. Status: Room:       5W25C   PERFORMING    [facility], Inpatient  ADMITTING    Opyd, Ilene Qua  ORDERING     Opyd, Timothy S  REFERRING    Opyd, Timothy S  ATTENDING    Duffy Bruce  SONOGRAPHER  Cardell Peach, RDCS  cc:  ------------------------------------------------------------------- LV EF: 55% -   60%  ------------------------------------------------------------------- Indications:      Bacteremia 790.7.  ------------------------------------------------------------------- History:   PMH:  IVUD.  Syncope.  Coronary artery disease. Transient ischemic attack.  Bacteremia.  Chronic obstructive pulmonary disease.  PMH:   Myocardial infarction.  ------------------------------------------------------------------- Study Conclusions  - Left ventricle: The cavity size was normal. Wall thickness was  normal. Indeterminant diastolic function. Systolic function was   normal. The estimated ejection fraction was in the range of 55%   to 60%. Basal inferior akinesis. - Aortic valve: There was a large vegetation on the aortic valve.   Mild aortic stenosis, this may be created by vegetation. There   was probably severe regurgitation. There does appear to be   holodiastolic flow reversal in the descending thoracic aorta.   Mean gradient (S): 12 mm Hg. Valve area (VTI): 1.54 cm^2. - Mitral valve: There was mild regurgitation. - Left atrium: The atrium was mildly dilated. - Right ventricle: The cavity size was normal. Systolic function   was normal. - Pulmonary arteries: No complete TR doppler jet so unable to   estimate PA systolic pressure. - Systemic veins: IVC measured 2.4 cm with < 50% respirophasic   variation, suggesting RA pressure 8 mmHg.  Impressions:  - Normal LV size with EF 55-60%. Basal inferior akinesis. Normal RV   size and systolic function. There was a large vegetation on the   aortic valve. Aortic regurgitation appeared to be severe. No   other obvious vegetations. Mild MR. Suggest TEE to further    evaluate.  ------------------------------------------------------------------- Study data:  Comparison was made to the study of 11/09/2016.  Study status:  Routine.  Procedure:  Transthoracic echocardiography. Image quality was adequate.  Study completion:  There were no complications.          Transthoracic echocardiography.  M-mode, complete 2D, spectral Doppler, and color Doppler.  Birthdate: Patient birthdate: 28-Mar-1966.  Age:  Patient is 52 yr old.  Sex: Gender: male.    BMI: 21.2 kg/m^2.  Blood pressure:     104/40 Patient status:  Inpatient.  Study date:  Study date: 02/19/2018. Study time: 01:49 PM.  Location:  Bedside.  -------------------------------------------------------------------  ------------------------------------------------------------------- Left ventricle:  The cavity size was normal. Wall thickness was normal. Indeterminant diastolic function. Systolic function was normal. The estimated ejection fraction was in the range of 55% to 60%. Basal inferior akinesis.  ------------------------------------------------------------------- Aortic valve:  There was a large vegetation on the aortic valve. Trileaflet. Mild aortic stenosis, this may be created by vegetation.  Doppler:  There was probably severe regurgitation. There does appear to be holodiastolic flow reversal in the descending thoracic aorta.    VTI ratio of LVOT to aortic valve: 0.49. Valve area (VTI): 1.54 cm^2. Indexed valve area (VTI): 0.81 cm^2/m^2. Peak velocity ratio of LVOT to aortic valve: 0.44. Valve area (Vmax): 1.38 cm^2. Indexed valve area (Vmax): 0.73 cm^2/m^2. Mean velocity ratio of LVOT to aortic valve: 0.39. Valve area (Vmean): 1.22 cm^2. Indexed valve area (Vmean): 0.65 cm^2/m^2. Mean gradient (S): 12 mm Hg. Peak gradient (S): 19 mm Hg.  ------------------------------------------------------------------- Aorta:  Aortic root: The aortic root was normal in size. Ascending aorta: The  ascending aorta was normal in size.  ------------------------------------------------------------------- Mitral valve:   Normal thickness leaflets .  Doppler:   There was no evidence for stenosis.   There was mild regurgitation.  ------------------------------------------------------------------- Left atrium:  The atrium was mildly dilated.  ------------------------------------------------------------------- Right ventricle:  The cavity size was normal. Systolic function was normal.  ------------------------------------------------------------------- Pulmonic valve:    Structurally normal valve.   Cusp separation was normal.  Doppler:  Transvalvular velocity was within the normal range. There was no regurgitation.  ------------------------------------------------------------------- Tricuspid valve:   Doppler:  There was no significant regurgitation.  ------------------------------------------------------------------- Pulmonary artery:   No complete TR doppler jet so unable to estimate PA systolic pressure.  -------------------------------------------------------------------  Right atrium:  The atrium was normal in size.  ------------------------------------------------------------------- Pericardium:  There was no pericardial effusion.  ------------------------------------------------------------------- Systemic veins:  IVC measured 2.4 cm with < 50% respirophasic variation, suggesting RA pressure 8 mmHg.  ------------------------------------------------------------------- Measurements   Left ventricle                           Value          Reference  LV ID, ED, PLAX chordal                  50    mm       43 - 52  LV ID, ES, PLAX chordal                  38    mm       23 - 38  LV fx shortening, PLAX chordal   (L)     24    %        >=29  LV PW thickness, ED                      10    mm       ----------  IVS/LV PW ratio, ED                      1.2             <=1.3  Stroke volume, 2D                        57    ml       ----------  Stroke volume/bsa, 2D                    30    ml/m^2   ----------    Ventricular septum                       Value          Reference  IVS thickness, ED                        12    mm       ----------    LVOT                                     Value          Reference  LVOT ID, S                               20    mm       ----------  LVOT area                                3.14  cm^2     ----------  LVOT peak velocity, S                    95.8  cm/s     ----------  LVOT mean velocity, S  62    cm/s     ----------  LVOT VTI, S                              18    cm       ----------  LVOT peak gradient, S                    4     mm Hg    ----------    Aortic valve                             Value          Reference  Aortic valve peak velocity, S            218   cm/s     ----------  Aortic valve mean velocity, S            159   cm/s     ----------  Aortic valve VTI, S                      36.7  cm       ----------  Aortic mean gradient, S                  12    mm Hg    ----------  Aortic peak gradient, S                  19    mm Hg    ----------  VTI ratio, LVOT/AV                       0.49           ----------  Aortic valve area, VTI                   1.54  cm^2     ----------  Aortic valve area/bsa, VTI               0.81  cm^2/m^2 ----------  Velocity ratio, peak, LVOT/AV            0.44           ----------  Aortic valve area, peak velocity         1.38  cm^2     ----------  Aortic valve area/bsa, peak              0.73  cm^2/m^2 ----------  velocity  Velocity ratio, mean, LVOT/AV            0.39           ----------  Aortic valve area, mean velocity         1.22  cm^2     ----------  Aortic valve area/bsa, mean              0.65  cm^2/m^2 ----------  velocity  Aortic regurg pressure half-time         102   ms       ----------    Aorta                                    Value  Reference  Aortic root ID, ED                       26    mm       ----------    Left atrium                              Value          Reference  LA ID, A-P, ES                           36    mm       ----------  LA ID/bsa, A-P                           1.9   cm/m^2   <=2.2  LA volume, S                             64.9  ml       ----------  LA volume/bsa, S                         34.3  ml/m^2   ----------  LA volume, ES, 1-p A4C                   58.2  ml       ----------  LA volume/bsa, ES, 1-p A4C               30.8  ml/m^2   ----------  LA volume, ES, 1-p A2C                   66.2  ml       ----------  LA volume/bsa, ES, 1-p A2C               35    ml/m^2   ----------    Right atrium                             Value          Reference  RA ID, S-I, ES, A4C                      44.3  mm       34 - 49  RA area, ES, A4C                         13.2  cm^2     8.3 - 19.5  RA volume, ES, A/L                       32    ml       ----------  RA volume/bsa, ES, A/L                   16.9  ml/m^2   ----------    Systemic veins                           Value          Reference  Estimated CVP  8     mm Hg    ----------    Right ventricle                          Value          Reference  RV ID, minor axis, ED, A4C base          37    mm       ----------  TAPSE                                    18    mm       ----------  RV s&', lateral, S                        10.3  cm/s     ----------  Legend: (L)  and  (H)  mark values outside specified reference range.  ------------------------------------------------------------------- Clayborne Dana, M.D. 2019-04-14T14:54:46   Transesophageal Echocardiography  (Report amended )  Patient:    Dvon, Jiles MR #:       [id number] Study Date: 02/19/2018 Gender:     M Age:        8 Height:     182.9 cm Weight:     67.3 kg BSA:        1.84 m^2 Pt. Status: Room:       2C17C    SONOGRAPHER  Florentina Jenny, RDCS  ORDERING     Candee Furbish, M.D.  PERFORMING   Candee Furbish, M.D.  REFERRING    Candee Furbish, M.D.  ATTENDING    Rai, Ripudeep K  ADMITTING    Opyd, Timothy S  cc:  ------------------------------------------------------------------- LV EF: 50% -   55%  ------------------------------------------------------------------- Indications:      Bacteremia 790.7.  ------------------------------------------------------------------- Study Conclusions  - Left ventricle: Systolic function was normal. The estimated   ejection fraction was in the range of 50% to 55%. - Aortic valve: Functionally bicuspid; fusion of the right-left   coronary commissure. There is severe calcification of well   circumscribed mass (1.6 x 0.6cm) compatible with calcified   vegetation. There was severe regurgitation. - Mitral valve: No evidence of vegetation. There was mild to   moderate regurgitation. - Left atrium: No evidence of thrombus in the atrial cavity or   appendage. - Right atrium: No evidence of thrombus in the atrial cavity or   appendage. - Atrial septum: No defect or patent foramen ovale was identified. - Tricuspid valve: No evidence of vegetation. - Pulmonic valve: No evidence of vegetation. - Superior vena cava: The study excluded a thrombus.  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  ------------------------------------------------------------------- Study data:   Study status:  Routine.  Consent:  The risks, benefits, and alternatives to the procedure were explained to the patient and informed consent was obtained.  Procedure:  The patient reported no pain pre or post test. Initial setup. The patient was brought to the laboratory. Surface ECG leads were monitored. Sedation. Conscious sedation was administered by cardiology staff. Transesophageal echocardiography. An adult multiplane transesophageal probe was inserted  by the attending cardiologistwithout difficulty. Image quality was adequate.  Study completion:  The patient tolerated the procedure well. There were no complications.  Administered medications:   Propofol. Diagnostic transesophageal echocardiography.  2D and color Doppler.  Birthdate:  Patient birthdate: May 03, 1966.  Age:  Patient is 52 yr old.  Sex:  Gender: male.    BMI: 20.1 kg/m^2.  Blood pressure: 98/50  Patient status:  Inpatient.  Study date:  Study date: 03/03/2018. Study time: 08:46 AM.  Location:  Endoscopy.  -------------------------------------------------------------------  ------------------------------------------------------------------- Left ventricle:  Systolic function was normal. The estimated ejection fraction was in the range of 50% to 55%.  ------------------------------------------------------------------- Aortic valve:   Functionally bicuspid; fusion of the right-left coronary commissure. There is severe calcification of well circumscribed mass (1.6 x 0.6cm) compatible with calcified vegetation.  Doppler:  There was severe regurgitation. 3-D image performed.  ------------------------------------------------------------------- Aorta:  The aorta was normal, not dilated, and non-diseased.  ------------------------------------------------------------------- Mitral valve:   Mildly thickened leaflets . Leaflet separation was normal.  No evidence of vegetation.  Doppler:  There was mild to moderate regurgitation.  ------------------------------------------------------------------- Left atrium:  The atrium was normal in size.  No evidence of thrombus in the atrial cavity or appendage.  ------------------------------------------------------------------- Atrial septum:  No defect or patent foramen ovale was identified.   ------------------------------------------------------------------- Right ventricle:  The cavity size was normal. Wall thickness  was normal. Systolic function was normal.  ------------------------------------------------------------------- Pulmonic valve:    Structurally normal valve.   Cusp separation was normal.  No evidence of vegetation.  ------------------------------------------------------------------- Tricuspid valve:   Structurally normal valve.   Leaflet separation was normal.  No evidence of vegetation.  Doppler:  There was no regurgitation.  ------------------------------------------------------------------- Pulmonary artery:   The main pulmonary artery was normal-sized.  ------------------------------------------------------------------- Right atrium:  The atrium was normal in size.  No evidence of thrombus in the atrial cavity or appendage.  ------------------------------------------------------------------- Pericardium:  The pericardium was normal in appearance. There was no pericardial effusion.  ------------------------------------------------------------------- Systemic veins: Superior vena cava: The study excluded a thrombus.  ------------------------------------------------------------------- Post procedure conclusions Ascending Aorta:  - The aorta was normal, not dilated, and non-diseased.  ------------------------------------------------------------------- Lovenia Kim, M.D. 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  Patient also has known history of coronary artery disease for which he has undergone multivessel PCI and stenting in the past.  At this point there is no clear evidence for septic embolization although MRI of the brain remains pending.  I have personally reviewed the patient's recent transthoracic and transesophageal  echocardiograms.  He has a Scientist, research (physical sciences) type I bicuspid aortic valve with active vegetation and severe aortic insufficiency.  Left ventricular systolic function remains normal.  Left ventricle is mildly dilated.  There is no obvious sign of annular abscess formation.  Neither the TEE nor previous CT angiogram of the chest suggest the presence of aneurysmal enlargement of the proximal ascending thoracic aorta.  At this point the patient appears clinically stable and improving on medical therapy, but long-term prognosis without aortic valve replacement would be very poor because of the severity of aortic insufficiency.    Plan:  I have discussed the nature of the patient's multiple ongoing problems at length with the patient and his wife at the bedside this evening.  Without a long-term plan in place for management of the patient's IV drug abuse it would be futile to treat his bacterial endocarditis aggressively.  The patient and his wife are interested in pursuing options to establish a plan for aggressive treatment of his problems with polysubstance abuse  and narcotic addiction.  This will need to be arranged by the medical team if the patient is to be considered a surgical candidate.  The patient and his wife were also counseled at length regarding treatment alternatives for management of severe aortic insufficiency in the setting of bacterial endocarditis including continued medical therapy versus proceeding with aortic valve replacement in the near future.  The natural history of acute aortic insufficiency was reviewed, as was long term prognosis with medical therapy alone.  Discussion was held comparing the relative risks of mechanical valve replacement with need for lifelong anticoagulation versus use of a bioprosthetic tissue valve and the associated potential for late structural valve deterioration and failure.  Expectations for the patient's postoperative convalescence and recovery were discussed.  All  questions answered.  If the patient is to be treated aggressively he will need to undergo left and right heart catheterization.  I would favor doing this sooner rather than later so as to definitively establish whether or not he has significant coronary artery disease that will need to be addressed and whether or not his congestive heart failure is adequately compensated at present.  If he does well he might benefit from aggressive medical tuneup for a relatively short period of time to facilitate the effects of intravenous antibiotics on the patient's underlying bacterial infection and potentially allow the patient to regain some strength.  On the other hand, the patient's aortic insufficiency is quite severe and he will need to be monitored carefully to make sure that his congestive heart failure stabilizes and improves on medical therapy.  We will continue to follow along but please call if specific problems or questions arise.   I spent in excess of 120 minutes during the conduct of this hospital consultation and >50% of this time involved direct face-to-face encounter for counseling and/or coordination of the patient's care.    Valentina Gu. Roxy Manns, MD 03/06/2018 6:46 PM

## 2018-02-21 NOTE — Transfer of Care (Signed)
Immediate Anesthesia Transfer of Care Note  Patient: Aaron Dodson  Procedure(s) Performed: TRANSESOPHAGEAL ECHOCARDIOGRAM (TEE) (N/A )  Patient Location: PACU and Endoscopy Unit  Anesthesia Type:MAC  Level of Consciousness: drowsy  Airway & Oxygen Therapy: Patient Spontanous Breathing and Patient connected to nasal cannula oxygen  Post-op Assessment: Report given to RN and Post -op Vital signs reviewed and stable  Post vital signs: Reviewed and stable  Last Vitals:  Vitals Value Taken Time  BP    Temp    Pulse 81 02/08/2018  9:44 AM  Resp 41 02/11/2018  9:44 AM  SpO2 96 % 02/12/2018  9:44 AM  Vitals shown include unvalidated device data.  Last Pain:  Vitals:   02/25/2018 0822  TempSrc: Oral  PainSc: 0-No pain      Patients Stated Pain Goal: 3 ([id number])  Complications: No apparent anesthesia complications

## 2018-02-21 NOTE — Progress Notes (Signed)
Per the pharmacist it is okay to run heparin drip together with the penicillin G since it is compactible, will continue to monitor

## 2018-02-21 NOTE — Progress Notes (Addendum)
[facility]                                                                              Patient Demographics  Aaron Dodson, is a 52 y.o. male, DOB - 1966/03/23, QJJ:[id number]  Admit date - 02/22/2018   Admitting Physician Vianne Bulls, MD  Outpatient Primary MD for the patient is Patient, No Pcp Per  Outpatient specialists:   LOS - 3  days   Medical records reviewed and are as summarized below:    Chief Complaint  Patient presents with  . Chest Pain       Brief summary   52 y.o.malew/ a remote hx ofIV drug use (no use in years per pt), prior tx for endocarditis, CAD with stents, chronic pain, and anxiety who presented to the ED w/ fevers "for a few months" and exertional dyspnea. Patient had been previously evaluated w/ blood cultures growing Strep viridans, but his management was complicated by his leaving the ED AMA on 4/10.  In the ED this admit the patient was found to have temp of 100 F,bp 80/37, and heart rate of 125. Chest x-ray was negative for edema or consolidation. Lactic acid was elevated to 5.17 and troponin was elevated to 0.26. CTA chest was negative for PE, but notable for centrilobular emphysema and a stable 7 mm nodule in the left apex.   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   -2D echo showed EF of 55-60% with basal inferior akinesis, large vegetation of aortic valve with severe regurgitation, normal RV size and function -TEE showed severe aortic regurgitation with heavy dense circumferential calcified vegetation 1.6x 0.6 cm.  -Consulted CTVS for  evaluation -MRI brain still pending rule out septic emboli to brain -Patient briefly required vasopressors during TEE procedure, will continue IV fluid hydration, transferred to stepdown bed  -ID following Addendum: 12:30pm Patient seen again after TEE, lethargic, BP in low 90's, will move to 2C or 2H bed, IV fluid bolus ordered, then 100cc/hr. May need pressors, with severe AI, avoid fluid overload. D/w Dr Pearline Cables (CCM), will cont current management, if deteriorating, will formally consult   Active Problems: Elevated troponins with history of CAD  -Patient had last cardiac cath February 2018 which showed diffuse mild to moderate disease and conservative management recommended -Troponins trending up, 0.25, cardiology following    Pain syndrome, chronic -Currently on oxycodone IR, morphine IV as needed -MRI cervical, thoracic, lumbar spine showed no discitis, osteomyelitis or abscess.    IV drug abuse ([place]) -Per patient he has been clean for the last 3 years, previously used IV heroin.  However he reported to cardiology PA that he had been using IV "molly" (MDMA)  Normocytic anemia -Hemoglobin 10.4 at the time of admission -Follow H&H, no obvious bleeding.  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  Code Status: Full CODE STATUS DVT Prophylaxis: SCD Family Communication: Discussed in detail with the patient, all imaging results, lab results explained to the patient   Disposition Plan: When medically clear Time Spent in minutes 35 minutes  Procedures:  2D echo  Consultants:   Cardiology ID  Antimicrobials:   IV Rocephin 4/12   Medications  Scheduled Meds: . aspirin EC  81 mg Oral Daily  . atorvastatin  40 mg Oral q1800  . feeding supplement (ENSURE ENLIVE)  237 mL Oral BID BM  . nicotine  21 mg Transdermal Daily  . pantoprazole  40 mg Oral Daily    Continuous Infusions: . heparin 1,850 Units/hr (02/15/2018 0509)  . pencillin G potassium IV Stopped (03/06/2018 0600)   PRN Meds:.acetaminophen, ALPRAZolam, cyclobenzaprine, loperamide, morphine injection, nitroGLYCERIN, ondansetron (ZOFRAN) IV, oxyCODONE, zolpidem   Antibiotics   Anti-infectives (From admission, onward)   Start     Dose/Rate Route Frequency Ordered Stop   02/20/18 2000  penicillin G potassium 4 Million Units in dextrose 5 % 250 mL IVPB     4 Million Units 250 mL/hr over 60 Minutes Intravenous Every 4 hours 02/20/18 1355     02/18/18 2200  cefTRIAXone (ROCEPHIN) 2 g in sodium chloride 0.9 % 100 mL IVPB  Status:  Discontinued     2 g 200 mL/hr over 30 Minutes Intravenous Every 24 hours 02/18/18 0439 02/20/18 1355   02/11/2018 2245  cefTRIAXone (ROCEPHIN) 2 g in sodium chloride 0.9 % 100 mL IVPB     2 g 200 mL/hr over 30 Minutes Intravenous  Once 02/15/2018 2243 02/18/18 0008        Subjective:   Aaron Dodson was seen and examined today.  Seen prior to TEE, had no acute complaints.  No fevers or chills.  Patient denied any dizziness, chest pain, abdominal pain, N/V/D/C, new weakness, numbess, tingling.  However subsequently during TEE, became hypotensive and required brief vasopressors.  Objective:   Vitals:   02/24/2018 1010 02/09/2018 1025 02/07/2018 1040 02/12/2018 1104  BP: (!) 103/46 (!) 99/45 (!) 98/50 (!) 102/58  Pulse: 88 85 88 91  Resp: (!) 29 (!) 25 (!) 24 (!) 27  Temp:    98.3 F (36.8 C)  TempSrc:    Oral  SpO2: 97% 95% 99% 100%  Weight:      Height:        Intake/Output Summary (Last 24 hours) at 03/02/2018 1158 Last data filed at 03/07/2018 1036 Gross per 24 hour  Intake 1444 ml  Output 150 ml  Net 1294 ml     Wt Readings from Last 3 Encounters:  03/05/2018 67.4 kg (148 lb 9.4 oz)  02/16/18 70.8 kg (156 lb)  02/15/18 70.8 kg (156 lb)     Exam General: Alert and oriented, NAD prior to tee Eyes: , HEENT:  Atraumatic, normocephalic, dentures  + Cardiovascular: S1 S2 auscultated, 3/6 murmur No pedal edema b/l Respiratory: Clear to auscultation bilaterally, no wheezing, rales or rhonchi Gastrointestinal: Soft, nontender, nondistended, + bowel sounds Ext: left AKA Neuro: left sided weakness Musculoskeletal: No digital cyanosis, clubbing Skin: No rashes Psych: Normal affect and demeanor, alert and oriented x3      Data Reviewed:  I have personally reviewed following labs and imaging studies  Micro Results Recent Results (from the past 240 hour(s))  Blood culture (routine  x 2)     Status: Abnormal   Collection Time: 02/15/18  6:25 PM  Result Value Ref Range Status   Specimen Description BLOOD RIGHT ANTECUBITAL  Final   Special Requests   Final    BOTTLES DRAWN AEROBIC AND ANAEROBIC Blood Culture adequate volume   Culture  Setup Time   Final    GRAM POSITIVE COCCI IN CHAINS AEROBIC BOTTLE ONLY CRITICAL RESULT CALLED TO, READ BACK BY AND VERIFIED WITH: Unk Lightning RN 15:40 02/16/18 ([email or web address])    Culture (A)  Final    VIRIDANS STREPTOCOCCUS THE SIGNIFICANCE OF ISOLATING THIS ORGANISM FROM A SINGLE VENIPUNCTURE CANNOT BE PREDICTED WITHOUT FURTHER CLINICAL AND CULTURE CORRELATION. SUSCEPTIBILITIES AVAILABLE ONLY ON REQUEST. Performed at [facility], [place] [address], [place], [place] [address]    Report Status 02/18/2018 FINAL  Final  Blood Culture ID Panel (Reflexed)     Status: Abnormal   Collection Time: 02/15/18  6:25 PM  Result Value Ref Range Status   Enterococcus species NOT DETECTED NOT DETECTED Final   Listeria monocytogenes NOT DETECTED NOT DETECTED Final   Staphylococcus species NOT DETECTED NOT DETECTED Final   Staphylococcus aureus NOT DETECTED NOT DETECTED Final   Streptococcus species DETECTED (A) NOT DETECTED Final    Comment: Not Enterococcus species, Streptococcus agalactiae, Streptococcus pyogenes, or Streptococcus pneumoniae. CRITICAL RESULT CALLED TO, READ BACK BY AND VERIFIED WITH: Unk Lightning RN  15:40 02/16/18 ([email or web address])    Streptococcus agalactiae NOT DETECTED NOT DETECTED Final   Streptococcus pneumoniae NOT DETECTED NOT DETECTED Final   Streptococcus pyogenes NOT DETECTED NOT DETECTED Final   Acinetobacter baumannii NOT DETECTED NOT DETECTED Final   Enterobacteriaceae species NOT DETECTED NOT DETECTED Final   Enterobacter cloacae complex NOT DETECTED NOT DETECTED Final   Escherichia coli NOT DETECTED NOT DETECTED Final   Klebsiella oxytoca NOT DETECTED NOT DETECTED Final   Klebsiella pneumoniae NOT DETECTED NOT DETECTED Final   Proteus species NOT DETECTED NOT DETECTED Final   Serratia marcescens NOT DETECTED NOT DETECTED Final   Haemophilus influenzae NOT DETECTED NOT DETECTED Final   Neisseria meningitidis NOT DETECTED NOT DETECTED Final   Pseudomonas aeruginosa NOT DETECTED NOT DETECTED Final   Candida albicans NOT DETECTED NOT DETECTED Final   Candida glabrata NOT DETECTED NOT DETECTED Final   Candida krusei NOT DETECTED NOT DETECTED Final   Candida parapsilosis NOT DETECTED NOT DETECTED Final   Candida tropicalis NOT DETECTED NOT DETECTED Final    Comment: Performed at [facility], [place] [address] [place], [place] [address]  Culture, blood (Routine x 2)     Status: Abnormal   Collection Time: 02/06/2018  9:00 PM  Result Value Ref Range Status   Specimen Description BLOOD RIGHT ANTECUBITAL  Final   Special Requests   Final    BOTTLES DRAWN AEROBIC AND ANAEROBIC Blood Culture adequate volume   Culture  Setup Time   Final    GRAM POSITIVE COCCI IN CHAINS IN BOTH AEROBIC AND ANAEROBIC BOTTLES CRITICAL RESULT CALLED TO, READ BACK BY AND VERIFIED WITH: E SINCLAIR,PHARMD AT 1258 02/18/18 BY L BENFIELD Performed at [facility], [place] [address], [place],  [address]    Culture VIRIDANS STREPTOCOCCUS (A)  Final   Report Status 02/20/2018 FINAL  Final   Organism ID, Bacteria VIRIDANS STREPTOCOCCUS  Final      Susceptibility   Viridans streptococcus - MIC*     TETRACYCLINE >=16 RESISTANT Resistant     VANCOMYCIN 0.5 SENSITIVE Sensitive  CLINDAMYCIN >=1 RESISTANT Resistant     PENICILLIN 0.12 SENSITIVE Sensitive     CEFTRIAXONE <=0.12 SENSITIVE Sensitive     ERYTHROMYCIN >=8 RESISTANT Resistant     LEVOFLOXACIN 0.5 SENSITIVE Sensitive     * VIRIDANS STREPTOCOCCUS  Blood Culture ID Panel (Reflexed)     Status: Abnormal   Collection Time: 03/02/2018  9:00 PM  Result Value Ref Range Status   Enterococcus species NOT DETECTED NOT DETECTED Final   Listeria monocytogenes NOT DETECTED NOT DETECTED Final   Staphylococcus species NOT DETECTED NOT DETECTED Final   Staphylococcus aureus NOT DETECTED NOT DETECTED Final   Streptococcus species DETECTED (A) NOT DETECTED Final    Comment: Not Enterococcus species, Streptococcus agalactiae, Streptococcus pyogenes, or Streptococcus pneumoniae. CRITICAL RESULT CALLED TO, READ BACK BY AND VERIFIED WITH: E SINCLAIR,PHARMD AT 1258 02/18/18 BY L BENFIELD    Streptococcus agalactiae NOT DETECTED NOT DETECTED Final   Streptococcus pneumoniae NOT DETECTED NOT DETECTED Final   Streptococcus pyogenes NOT DETECTED NOT DETECTED Final   Acinetobacter baumannii NOT DETECTED NOT DETECTED Final   Enterobacteriaceae species NOT DETECTED NOT DETECTED Final   Enterobacter cloacae complex NOT DETECTED NOT DETECTED Final   Escherichia coli NOT DETECTED NOT DETECTED Final   Klebsiella oxytoca NOT DETECTED NOT DETECTED Final   Klebsiella pneumoniae NOT DETECTED NOT DETECTED Final   Proteus species NOT DETECTED NOT DETECTED Final   Serratia marcescens NOT DETECTED NOT DETECTED Final   Haemophilus influenzae NOT DETECTED NOT DETECTED Final   Neisseria meningitidis NOT DETECTED NOT DETECTED Final   Pseudomonas aeruginosa NOT DETECTED NOT DETECTED Final   Candida albicans NOT DETECTED NOT DETECTED Final   Candida glabrata NOT DETECTED NOT DETECTED Final   Candida krusei NOT DETECTED NOT DETECTED Final   Candida parapsilosis NOT  DETECTED NOT DETECTED Final   Candida tropicalis NOT DETECTED NOT DETECTED Final    Comment: Performed at [facility], [place] [address] [place], [place] [address]  Culture, blood (Routine x 2)     Status: Abnormal   Collection Time: 02/25/2018 10:19 PM  Result Value Ref Range Status   Specimen Description BLOOD RIGHT FOREARM  Final   Special Requests   Final    BOTTLES DRAWN AEROBIC AND ANAEROBIC Blood Culture adequate volume   Culture  Setup Time   Final    GRAM POSITIVE COCCI IN CHAINS IN BOTH AEROBIC AND ANAEROBIC BOTTLES CRITICAL RESULT CALLED TO, READ BACK BY AND VERIFIED WITH: E. SINCLAIR PHARMD, AT 1339 02/18/18 BY D. VANHOOK    Culture (A)  Final    VIRIDANS STREPTOCOCCUS SUSCEPTIBILITIES PERFORMED ON PREVIOUS CULTURE WITHIN THE LAST 5 DAYS. Performed at [facility], [place] [address], [place], [place] [address]    Report Status 02/20/2018 FINAL  Final  MRSA PCR Screening     Status: None   Collection Time: 02/18/18  9:16 PM  Result Value Ref Range Status   MRSA by PCR NEGATIVE NEGATIVE Final    Comment:        The GeneXpert MRSA Assay (FDA approved for NASAL specimens only), is one component of a comprehensive MRSA colonization surveillance program. It is not intended to diagnose MRSA infection nor to guide or monitor treatment for MRSA infections. Performed at [facility], [place] [address], [place], [place] [address]     Radiology Reports Dg Chest 2 View  Result Date: 03/01/2018 CLINICAL DATA:  Shortness of breath and fever EXAM: CHEST - 2 VIEW COMPARISON:  February 15, 2018 FINDINGS: There is no edema or consolidation. The heart size and pulmonary vascularity are normal. No adenopathy. There are multiple foci of coronary artery calcification. No bone lesions. IMPRESSION: Extensive coronary artery calcification.  No edema or consolidation. Electronically Signed   By: Lowella Grip III M.D.   On: 02/08/2018 21:25   Dg Chest 2 View  Result Date:  02/15/2018 CLINICAL DATA:  Productive cough x2 weeks. Low-grade fever x3 weeks. EXAM: CHEST - 2 VIEW COMPARISON:  05/05/2017 FINDINGS: The heart size and mediastinal contours are within normal limits. Coronary stent noted on the lateral view. No CHF or pulmonary consolidations. Mild emphysematous hyperinflation of the lungs, upper lobe predominant. Mild chronic interstitial prominence. The visualized skeletal structures are unremarkable. IMPRESSION: No active cardiopulmonary disease. Mild emphysematous hyperinflation of the lungs, upper lobe predominant. Electronically Signed   By: Ashley Royalty M.D.   On: 02/15/2018 19:24   Ct Angio Chest Pe W And/or Wo Contrast  Result Date: 03/07/2018 CLINICAL DATA:  Pericarditis, difficulty breathing with fevers. History of IVDA. EXAM: CT ANGIOGRAPHY CHEST WITH CONTRAST TECHNIQUE: Multidetector CT imaging of the chest was performed using the standard protocol during bolus administration of intravenous contrast. Multiplanar CT image reconstructions and MIPs were obtained to evaluate the vascular anatomy. CONTRAST:  141mL ISOVUE-370 IOPAMIDOL (ISOVUE-370) INJECTION 76% COMPARISON:  11/09/2016 CT FINDINGS: Cardiovascular: The study is of quality for the evaluation of pulmonary embolism. There are no filling defects in the central, lobar, segmental or subsegmental pulmonary artery branches to suggest acute pulmonary embolism. Great vessels are normal in course and caliber. Top-normal heart size. No significant pericardial fluid/thickening. Mediastinum/Nodes: No discrete thyroid nodules. Unremarkable esophagus. No pathologically enlarged axillary, mediastinal or hilar lymph nodes. Lungs/Pleura: No pneumothorax. No pleural effusion. Stable 7 mm nodular density in the medial left upper lobe, series 6/20. Centrilobular emphysema without acute pneumonic consolidation. Bibasilar dependent atelectasis. Upper abdomen: Unremarkable. Musculoskeletal:  No aggressive appearing focal osseous  lesions. Review of the MIP images confirms the above findings. IMPRESSION: 1. No acute pulmonary embolus. 2. Centrilobular emphysema. 3. Stable 7 mm nodular density in the medial left lung apex. As this appears stable since 11/09/2016, additional follow-up at 18-24 months from the 11/09/2016 study is recommended in high risk patients which given patient's COPD and IVDA history, would be recommended. This recommendation follows the consensus statement: Guidelines for Management of Incidental Pulmonary Nodules Detected on CT Images: From the Fleischner Society 2017; Radiology 2017; [id number]. Emphysema (ICD10-J43.9). Electronically Signed   By: Ashley Royalty M.D.   On: 02/19/2018 23:15   Mr Cervical Spine W Wo Contrast  Result Date: 02/18/2018 CLINICAL DATA:  Infective endocarditis. EXAM: MRI TOTAL SPINE WITHOUT AND WITH CONTRAST TECHNIQUE: Multisequence MR imaging of the spine from the cervical spine to the sacrum was performed prior to and following IV contrast administration. CONTRAST:  74mL MULTIHANCE GADOBENATE DIMEGLUMINE 529 MG/ML IV SOLN COMPARISON:  None. FINDINGS: MRI CERVICAL SPINE FINDINGS Alignment: Mild C3-4 anterolisthesis. Vertebrae: No fracture, evidence of discitis, or bone lesion.Diffuse hypointense marrow likely related to patient's anemia. Cord: Normal signal and morphology. Posterior Fossa, vertebral arteries, paraspinal tissues: No convincing inflammation. Disc levels: Shallow disc protrusions at C3-4, C5-6, and C6-7. No visible cord impingement. Limited assessment of foraminal narrowing due to motion. MRI THORACIC SPINE FINDINGS Alignment:  Normal. Vertebrae: No evidence of discitis, fracture, or aggressive lesion. Diffuse hypointense marrow likely related to patient's anemia. Cord:  Normal signal and morphology. Paraspinal and other soft tissues: No visible abscess or inflammation. Disc levels: Tiny  T8-9 disc protrusion.  No impingement. MRI LUMBAR SPINE FINDINGS Segmentation:  5 lumbar  type vertebral bodies. Alignment:  Normal. Vertebrae: Diffusely hypointense marrow as above. No evidence of discitis, fracture, or bone lesion. Conus medullaris: Extends to the L1-2 level and appears normal. Paraspinal and other soft tissues: Nonspecific retroperitoneal and subcutaneous edema. No visible abscess or discrete inflammation. Disc levels: Good disc height and hydration.  Negative facets.  No impingement. There is pervasive motion artifact in this patient with pain. Pathologic findings could be obscured. IMPRESSION: 1. No visible discitis/osteomyelitis or abscess. No acute finding throughout the visualized spine. 2. Extensive motion degradation. Electronically Signed   By: Monte Fantasia M.D.   On: 02/18/2018 21:12   Mr Thoracic Spine W Wo Contrast  Result Date: 02/18/2018 CLINICAL DATA:  Infective endocarditis. EXAM: MRI TOTAL SPINE WITHOUT AND WITH CONTRAST TECHNIQUE: Multisequence MR imaging of the spine from the cervical spine to the sacrum was performed prior to and following IV contrast administration. CONTRAST:  25mL MULTIHANCE GADOBENATE DIMEGLUMINE 529 MG/ML IV SOLN COMPARISON:  None. FINDINGS: MRI CERVICAL SPINE FINDINGS Alignment: Mild C3-4 anterolisthesis. Vertebrae: No fracture, evidence of discitis, or bone lesion.Diffuse hypointense marrow likely related to patient's anemia. Cord: Normal signal and morphology. Posterior Fossa, vertebral arteries, paraspinal tissues: No convincing inflammation. Disc levels: Shallow disc protrusions at C3-4, C5-6, and C6-7. No visible cord impingement. Limited assessment of foraminal narrowing due to motion. MRI THORACIC SPINE FINDINGS Alignment:  Normal. Vertebrae: No evidence of discitis, fracture, or aggressive lesion. Diffuse hypointense marrow likely related to patient's anemia. Cord:  Normal signal and morphology. Paraspinal and other soft tissues: No visible abscess or inflammation. Disc levels: Tiny T8-9 disc protrusion.  No impingement. MRI  LUMBAR SPINE FINDINGS Segmentation:  5 lumbar type vertebral bodies. Alignment:  Normal. Vertebrae: Diffusely hypointense marrow as above. No evidence of discitis, fracture, or bone lesion. Conus medullaris: Extends to the L1-2 level and appears normal. Paraspinal and other soft tissues: Nonspecific retroperitoneal and subcutaneous edema. No visible abscess or discrete inflammation. Disc levels: Good disc height and hydration.  Negative facets.  No impingement. There is pervasive motion artifact in this patient with pain. Pathologic findings could be obscured. IMPRESSION: 1. No visible discitis/osteomyelitis or abscess. No acute finding throughout the visualized spine. 2. Extensive motion degradation. Electronically Signed   By: Monte Fantasia M.D.   On: 02/18/2018 21:12   Mr Lumbar Spine W Wo Contrast  Result Date: 02/18/2018 CLINICAL DATA:  Infective endocarditis. EXAM: MRI TOTAL SPINE WITHOUT AND WITH CONTRAST TECHNIQUE: Multisequence MR imaging of the spine from the cervical spine to the sacrum was performed prior to and following IV contrast administration. CONTRAST:  71mL MULTIHANCE GADOBENATE DIMEGLUMINE 529 MG/ML IV SOLN COMPARISON:  None. FINDINGS: MRI CERVICAL SPINE FINDINGS Alignment: Mild C3-4 anterolisthesis. Vertebrae: No fracture, evidence of discitis, or bone lesion.Diffuse hypointense marrow likely related to patient's anemia. Cord: Normal signal and morphology. Posterior Fossa, vertebral arteries, paraspinal tissues: No convincing inflammation. Disc levels: Shallow disc protrusions at C3-4, C5-6, and C6-7. No visible cord impingement. Limited assessment of foraminal narrowing due to motion. MRI THORACIC SPINE FINDINGS Alignment:  Normal. Vertebrae: No evidence of discitis, fracture, or aggressive lesion. Diffuse hypointense marrow likely related to patient's anemia. Cord:  Normal signal and morphology. Paraspinal and other soft tissues: No visible abscess or inflammation. Disc levels: Tiny  T8-9 disc protrusion.  No impingement. MRI LUMBAR SPINE FINDINGS Segmentation:  5 lumbar type vertebral bodies. Alignment:  Normal. Vertebrae: Diffusely hypointense marrow as above. No  evidence of discitis, fracture, or bone lesion. Conus medullaris: Extends to the L1-2 level and appears normal. Paraspinal and other soft tissues: Nonspecific retroperitoneal and subcutaneous edema. No visible abscess or discrete inflammation. Disc levels: Good disc height and hydration.  Negative facets.  No impingement. There is pervasive motion artifact in this patient with pain. Pathologic findings could be obscured. IMPRESSION: 1. No visible discitis/osteomyelitis or abscess. No acute finding throughout the visualized spine. 2. Extensive motion degradation. Electronically Signed   By: Monte Fantasia M.D.   On: 02/18/2018 21:12    Lab Data:  CBC: Recent Labs  Lab 02/15/18 1825 02/16/18 2147 02/12/2018 2100 02/19/18 0439 02/20/18 0505  WBC 11.3* 7.4 10.7* 8.7 10.8*  NEUTROABS 9.7* 5.6  --   --   --   HGB 10.2* 9.7* 10.4* 8.7* 8.8*  HCT 30.8* 28.8* 30.6* 25.7* 26.4*  MCV 89.3 88.1 87.2 88.3 88.6  PLT 178 166 187 186 867   Basic Metabolic Panel: Recent Labs  Lab 02/16/18 2147 03/07/2018 2100 02/18/18 0446 02/19/18 0439 02/20/18 0505  NA 131* 133* 132* 137 138  K 3.5 3.4* 3.4* 3.4* 3.1*  CL 95* 97* 99* 108 107  CO2 25 21* 21* 20* 22  GLUCOSE 135* 136* 102* 92 107*  BUN 14 15 13 6  <5*  CREATININE 1.25* 1.48* 1.39* 1.13 1.05  CALCIUM 10.3 10.2 9.1 9.1 9.6   GFR: Estimated Creatinine Clearance: 79.3 mL/min (by C-G formula based on SCr of 1.05 mg/dL). Liver Function Tests: Recent Labs  Lab 02/15/18 1825 02/16/18 2147 02/22/2018 2220 02/19/18 0439  AST 23 33 31 18  ALT 9* 11* 12* 8*  ALKPHOS 86 78 85 62  BILITOT 0.7 0.7 0.7 0.5  PROT 7.0 6.6 6.3* 5.7*  ALBUMIN 2.4* 2.3* 2.3* 1.9*   No results for input(s): LIPASE, AMYLASE in the last 168 hours. No results for input(s): AMMONIA in the last  168 hours. Coagulation Profile: Recent Labs  Lab 02/10/2018 2100  INR 1.13   Cardiac Enzymes: Recent Labs  Lab 02/18/18 0440 02/18/18 0935 02/18/18 2105 02/19/18 0439  TROPONINI 0.23* 0.18* 0.16* 0.25*   BNP (last 3 results) No results for input(s): PROBNP in the last 8760 hours. HbA1C: No results for input(s): HGBA1C in the last 72 hours. CBG: No results for input(s): GLUCAP in the last 168 hours. Lipid Profile: Recent Labs    02/20/2018 0349  CHOL 118  HDL 15*  LDLCALC 79  TRIG 122  CHOLHDL 7.9   Thyroid Function Tests: No results for input(s): TSH, T4TOTAL, FREET4, T3FREE, THYROIDAB in the last 72 hours. Anemia Panel: No results for input(s): VITAMINB12, FOLATE, FERRITIN, TIBC, IRON, RETICCTPCT in the last 72 hours. Urine analysis:    Component Value Date/Time   COLORURINE YELLOW 02/18/2018 0400   APPEARANCEUR CLEAR 02/18/2018 0400   LABSPEC 1.024 02/18/2018 0400   PHURINE 5.0 02/18/2018 0400   GLUCOSEU NEGATIVE 02/18/2018 0400   HGBUR LARGE (A) 02/18/2018 0400   BILIRUBINUR NEGATIVE 02/18/2018 0400   KETONESUR NEGATIVE 02/18/2018 0400   PROTEINUR NEGATIVE 02/18/2018 0400   NITRITE NEGATIVE 02/18/2018 0400   LEUKOCYTESUR SMALL (A) 02/18/2018 0400     Ripudeep Rai M.D. [facility] 03/03/2018, 11:58 AM  Pager: [phone] Between 7am to 7pm - call Pager - [phone]  After 7pm go to [email or web address] - password [email or web address]  Call night coverage person covering after 7pm

## 2018-02-21 NOTE — Progress Notes (Signed)
Pt spike a temp this morning PO tylenol given will continue to monitor

## 2018-02-21 NOTE — Progress Notes (Signed)
NURSING PROGRESS NOTE  Aaron Dodson [id number] Transfer Data: 03/03/2018 2:41 PM Attending Provider: Mendel Corning, MD MAU:[id number], No Pcp Per Code Status: Full  Aaron Dodson is a 52 y.o. male patient transferred from 5W 25 to [place] 17 -No acute distress noted.  -No complaints of shortness of breath.  -No complaints of chest pain.   Cardiac Monitoring: Box  in place. Cardiac monitor yields:NSR  Blood pressure (!) 110/57, pulse 91, temperature 99 F (37.2 C), temperature source Oral, resp. rate (!) 29, height 6' (1.829 m), weight 67.4 kg (148 lb 9.4 oz), SpO2 100 %.   IV Fluids:  IV in place, occlusive dsg intact without redness,  Allergies:  Benadryl [diphenhydramine hcl]; Promethazine hcl; Augmentin [amoxicillin-pot clavulanate]; and Toradol [ketorolac tromethamine]  Past Medical History:   has a past medical history of ADD (attention deficit disorder), Anxiety, Chronic chest pain, Chronic lower back pain, Club foot, COPD (chronic obstructive pulmonary disease) ([place]), Coronary artery disease, Endocarditis (2013), GERD (gastroesophageal reflux disease), History of echocardiogram, Hyperlipidemia, Hypertension, IV drug abuse ([place]), Myocardial infarction ([place]) (2000's - ~ 04/2015), PAF (paroxysmal atrial fibrillation) ([place]), PTSD (post-traumatic stress disorder), Pulmonary embolism ([place]), S/P AKA (above knee amputation) unilateral ([place]), Septic pulmonary embolism ([place]) (2013), Squamous cell carcinoma of scalp, and Stroke ([place]) (~ 2005-2006 X 2).  Past Surgical History:   has a past surgical history that includes Tonsillectomy; Club foot release (Left, 1967/07/20); Above knee leg amputaton (Left); orthopedic surgery (10-18-1967-~ 2007); Laparoscopic cholecystectomy; Cardiac catheterization ("several"); Coronary angioplasty ("several"); Coronary angioplasty with stent ("several"); Cardiac catheterization (N/A, 11/26/2015); and LEFT HEART CATH AND CORONARY ANGIOGRAPHY (N/A, 12/20/2016).  Social History:    reports that he has been smoking cigarettes.  He has a 17.50 pack-year smoking history. He has never used smokeless tobacco. He reports that he drank alcohol. He reports that he has current or past drug history.  Skin: intact  Patient orientated to room. Information packet given to patient. Admission inpatient armband information verified with patient to include name and date of birth and placed on patient arm. Side rails up x 2, fall assessment and education completed with patient. Patient able to verbalize understanding of risk associated with falls and verbalized understanding to call for assistance before getting out of bed. Call light within reach. Patient able to voice and demonstrate understanding of unit orientation instructions.    Will continue to evaluate and treat per MD orders.

## 2018-02-21 NOTE — Progress Notes (Signed)
Call placed to Dr. Tana Coast to inform her of BP in endoscopy recovery after TEE. Informed of BPs per vitals section and medication treatment by CRNA with IV push vasopressors. Verbal order received for transfer to stepdown bed on 2C.

## 2018-02-21 NOTE — Progress Notes (Signed)
PULMONARY / CRITICAL CARE MEDICINE   Name: Aaron Dodson MRN: [id number] DOB: July 12, 1966    ADMISSION DATE:  02/11/2018 CONSULTATION DATE: 4/16  REFERRING MD:  RAI  CHIEF COMPLAINT:  Dyspnea  HISTORY OF PRESENT ILLNESS:        This is a 52 year old with a history of IV drug abuse who presented on 4/2 complaining of months of fever and new onset dyspnea.  He was found to be bacteremic with strep viridans transthoracic echocardiogram showed normal systolic LV function with severe AI and a large aortic valve vegetation.  He was taken for TEE today at which time he received sedation with propofol.  He apparently was relatively hypotensive and received a total of 2 L of normal saline for the procedure.  Following the procedure he is complaining of increased dyspnea.  He did not have any nausea or vomiting associated with the procedure.  He is a lifelong heavy smoker.  Not currently having any cough.  PAST MEDICAL HISTORY :  He  has a past medical history of ADD (attention deficit disorder), Anxiety, Chronic chest pain, Chronic lower back pain, Club foot, COPD (chronic obstructive pulmonary disease) ([place]), Coronary artery disease, Endocarditis (2013), GERD (gastroesophageal reflux disease), History of echocardiogram, Hyperlipidemia, Hypertension, IV drug abuse ([place]), Myocardial infarction ([place]) (2000's - ~ 04/2015), PAF (paroxysmal atrial fibrillation) ([place]), PTSD (post-traumatic stress disorder), Pulmonary embolism ([place]), S/P AKA (above knee amputation) unilateral ([place]), Septic pulmonary embolism ([place]) (2013), Squamous cell carcinoma of scalp, and Stroke ([place]) (~ 2005-2006 X 2).  PAST SURGICAL HISTORY: He  has a past surgical history that includes Tonsillectomy; Club foot release (Left, May 25, 1967); Above knee leg amputaton (Left); orthopedic surgery (04-29-67-~ 2007); Laparoscopic cholecystectomy; Cardiac catheterization ("several"); Coronary angioplasty ("several"); Coronary angioplasty with stent  ("several"); Cardiac catheterization (N/A, 11/26/2015); and LEFT HEART CATH AND CORONARY ANGIOGRAPHY (N/A, 12/20/2016).  Allergies  Allergen Reactions  . Benadryl [Diphenhydramine Hcl] Other (See Comments)    Aggressive behavior  . Promethazine Hcl Other (See Comments)    Unknown - given for surgical procedure and advised to never take again  . Augmentin [Amoxicillin-Pot Clavulanate] Diarrhea and Nausea Only  . Toradol [Ketorolac Tromethamine] Hives and Other (See Comments)    Redness (also) Patient states he tolerated ibuprofen fine    No current facility-administered medications on file prior to encounter.    Current Outpatient Medications on File Prior to Encounter  Medication Sig  . acetaminophen (TYLENOL) 325 MG tablet Take 2 tablets (650 mg total) by mouth every 4 (four) hours as needed for headache or mild pain.  Marland Kitchen aspirin-acetaminophen-caffeine (EXCEDRIN MIGRAINE) 250-250-65 MG tablet Take 2 tablets by mouth every 6 (six) hours as needed for headache.  . cyclobenzaprine (FLEXERIL) 10 MG tablet Take 10 mg by mouth 3 (three) times daily as needed for muscle spasms.   Marland Kitchen ibuprofen (ADVIL,MOTRIN) 200 MG tablet Take 1,400 mg by mouth daily as needed for moderate pain.  Marland Kitchen loperamide (IMODIUM A-D) 2 MG tablet Take 2-4 mg by mouth 4 (four) times daily as needed for diarrhea or loose stools.  . nitroGLYCERIN (NITROSTAT) 0.4 MG SL tablet Place 1 tablet (0.4 mg total) under the tongue every 5 (five) minutes x 3 doses as needed for chest pain.    FAMILY HISTORY:  His indicated that the status of his father is unknown.   SOCIAL HISTORY: He  reports that he has been smoking cigarettes.  He has a 17.50 pack-year smoking history. He has never used smokeless tobacco. He reports that he drank  alcohol. He reports that he has current or past drug history.  REVIEW OF SYSTEMS:       None known to have chronic lung disease but a CTA obtained at admission shows bullous disease.  Exercise is limited by  a previous left BKA.  He has no known history of diabetes   SUBJECTIVE:  As Above  VITAL SIGNS: BP 132/60 (BP Location: Left Arm)   Pulse 100   Temp 98.6 F (37 C) (Oral)   Resp (!) 29   Ht 6' (1.829 m)   Wt 148 lb 9.4 oz (67.4 kg)   SpO2 100%   BMI 20.15 kg/m   HEMODYNAMICS:    VENTILATOR SETTINGS:    INTAKE / OUTPUT: I/O last 3 completed shifts: In: 1526 [P.O.:1216; I.V.:210; IV Piggyback:100] Out: 650 [Urine:650]  PHYSICAL EXAMINATION: General: This is a somewhat cachectic and anxious individual who is tachypneic but able to speak in full sentences. Neuro: He is appropriately interactive and oriented.  He is moving all his extremities.  His pupils are equal. Cardiovascular: S1 and S2 are regular with frequent ectopics, I do not detect a murmur nor do I detect JVD or dependent edema. Lungs: He is slightly labored and has only poor air movement throughout.  There are some dependent rales but the most impressive feature on physical examination is poor air movement and diffuse wheezes Abdomen: Abdomen is soft without any organomegaly masses or tenderness.  Liver is nonpulsatile.  He is anicteric.   LABS:  BMET Recent Labs  Lab 02/18/18 0446 02/19/18 0439 02/20/18 0505  NA 132* 137 138  K 3.4* 3.4* 3.1*  CL 99* 108 107  CO2 21* 20* 22  BUN 13 6 <5*  CREATININE 1.39* 1.13 1.05  GLUCOSE 102* 92 107*    Electrolytes Recent Labs  Lab 02/18/18 0446 02/19/18 0439 02/20/18 0505  CALCIUM 9.1 9.1 9.6    CBC Recent Labs  Lab 03/06/2018 2100 02/19/18 0439 02/20/18 0505  WBC 10.7* 8.7 10.8*  HGB 10.4* 8.7* 8.8*  HCT 30.6* 25.7* 26.4*  PLT 187 186 222    Coag's Recent Labs  Lab 02/14/2018 2100  INR 1.13    Sepsis Markers Recent Labs  Lab 02/18/18 0252 02/18/18 0942 02/19/18 0439  LATICACIDVEN 2.57* 2.26* 1.5    ABG No results for input(s): PHART, PCO2ART, PO2ART in the last 168 hours.  Liver Enzymes Recent Labs  Lab 02/16/18 2147  02/25/2018 2220 02/19/18 0439  AST 33 31 18  ALT 11* 12* 8*  ALKPHOS 78 85 62  BILITOT 0.7 0.7 0.5  ALBUMIN 2.3* 2.3* 1.9*    Cardiac Enzymes Recent Labs  Lab 02/18/18 0935 02/18/18 2105 02/19/18 0439  TROPONINI 0.18* 0.16* 0.25*    Glucose No results for input(s): GLUCAP in the last 168 hours.  Imaging No results found.     ANTIBIOTICS: Penicillin G  SIGNIFICANT EVENTS:     DISCUSSION:      52 year old with a history of IV drug abuse who likely has chronic lung disease at baseline based on bullous disease on admission CT scan of his chest significant smoking history. 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  We are asked to see the patient for dyspnea following a TEE earlier today.  ASSESSMENT / PLAN:  PULMONARY A: I suspect he has a component of fluid overload related to the AIN 2 L of crystalloid administered to support his blood pressure during TEE.  He has received a dose of Lasix.  In addition he is bronchospastic and both duo nebs and as needed Ventolin have been ordered.  Chest x-ray is pending to confirm the presence of fluid overload and to rule out an infiltrate   CARDIOVASCULAR A:  Acute aortic insufficiency secondary to strep viridans endocarditis.  Diuresing the patient as noted.  I note his last measured potassium was 3.1 and I have ordered repeat electrolytes today.  RENAL A: Last known creatinine was 1.05   GASTROINTESTINAL A: Prophylaxis is with Protonix.    INFECTIOUS A: He is on a penicillin G infusion for strep viridans endocarditis.   ENDOCRINE A: No issues    NEUROLOGIC A: Grossly nonfocal despite extensive vegetations on the aortic valve   Lars Masson, MD [place] Pager: [phone]  02/20/2018, 3:00 PM

## 2018-02-21 NOTE — Progress Notes (Signed)
[facility] for Infectious Disease  Date of Admission:  02/22/2018   Total days of antibiotics 5        Day 2 penicillin                  Patient ID: Aaron Dodson is a 52 y.o. male with  Principal Problem:   Aortic valve endocarditis Active Problems:   Bacteremia due to Streptococcus   Chronic coronary artery disease   Pain syndrome, chronic   NSTEMI (non-ST elevated myocardial infarction) (HCC)   IV drug abuse ([place])   Renal insufficiency   Normocytic anemia   Hypotension   Infection by Streptococcus, viridans group   Septic embolism ([place])   . aspirin EC  81 mg Oral Daily  . atorvastatin  40 mg Oral q1800  . feeding supplement (ENSURE ENLIVE)  237 mL Oral BID BM  . nicotine  21 mg Transdermal Daily  . pantoprazole  40 mg Oral Daily    SUBJECTIVE: Temp 101.3 overnight. TEE completed with bicuspid aortic valve and likely calcified vegetation. He has been very sleepy/lethargic with intermittent low blood pressure following sedation required from endoscopy. Tolerating the PCN infusion well. Has not had brain MRI yet.   Allergies  Allergen Reactions  . Benadryl [Diphenhydramine Hcl] Other (See Comments)    Aggressive behavior  . Promethazine Hcl Other (See Comments)    Unknown - given for surgical procedure and advised to never take again  . Augmentin [Amoxicillin-Pot Clavulanate] Diarrhea and Nausea Only  . Toradol [Ketorolac Tromethamine] Hives and Other (See Comments)    Redness (also) Patient states he tolerated ibuprofen fine    OBJECTIVE: Vitals:   02/28/2018 1010 03/04/2018 1025 03/05/2018 1040 02/07/2018 1104  BP: (!) 103/46 (!) 99/45 (!) 98/50 (!) 102/58  Pulse: 88 85 88 91  Resp: (!) 29 (!) 25 (!) 24 (!) 27  Temp:    98.3 F (36.8 C)  TempSrc:    Oral  SpO2: 97% 95% 99% 100%  Weight:      Height:       Body mass index is 20.15 kg/m.  Physical Exam  Constitutional: He is oriented to person, place, and time. He appears well-developed and  well-nourished.  Resting in bed. Falls asleep and awakens easily.   HENT:  Mouth/Throat: No oral lesions. Normal dentition. No dental caries.  Eyes: Pupils are equal, round, and reactive to light. EOM are normal. No scleral icterus.  Neck: Normal range of motion.  Cardiovascular: Normal rate and regular rhythm.  Murmur heard.  Diastolic murmur is present with a grade of 2/6. Pulses:      Dorsalis pedis pulses are 2+ on the right side.  Pulmonary/Chest: Effort normal and breath sounds normal.  Abdominal: Soft. He exhibits no distension. There is no tenderness.  Musculoskeletal:       Right lower leg: Normal.  Left AKA   Lymphadenopathy:    He has no cervical adenopathy.  Neurological: He is alert and oriented to person, place, and time.  Skin: Skin is warm and dry. No rash noted.  Nursing note and vitals reviewed.   Lab Results Lab Results  Component Value Date   WBC 10.8 (H) 02/20/2018   HGB 8.8 (L) 02/20/2018   HCT 26.4 (L) 02/20/2018   MCV 88.6 02/20/2018   PLT 222 02/20/2018    Lab Results  Component Value Date   CREATININE 1.05 02/20/2018   BUN <5 (L) 02/20/2018   NA  138 02/20/2018   K 3.1 (L) 02/20/2018   CL 107 02/20/2018   CO2 22 02/20/2018    Lab Results  Component Value Date   ALT 8 (L) 02/19/2018   AST 18 02/19/2018   ALKPHOS 62 02/19/2018   BILITOT 0.5 02/19/2018     Microbiology: Recent Results (from the past 240 hour(s))  Blood culture (routine x 2)     Status: Abnormal   Collection Time: 02/15/18  6:25 PM  Result Value Ref Range Status   Specimen Description BLOOD RIGHT ANTECUBITAL  Final   Special Requests   Final    BOTTLES DRAWN AEROBIC AND ANAEROBIC Blood Culture adequate volume   Culture  Setup Time   Final    GRAM POSITIVE COCCI IN CHAINS AEROBIC BOTTLE ONLY CRITICAL RESULT CALLED TO, READ BACK BY AND VERIFIED WITH: Unk Lightning RN 15:40 02/16/18 ([email or web address])    Culture (A)  Final    VIRIDANS STREPTOCOCCUS THE SIGNIFICANCE OF ISOLATING  THIS ORGANISM FROM A SINGLE VENIPUNCTURE CANNOT BE PREDICTED WITHOUT FURTHER CLINICAL AND CULTURE CORRELATION. SUSCEPTIBILITIES AVAILABLE ONLY ON REQUEST. Performed at [facility], [facility] [address], [place], [place] [address]    Report Status 02/18/2018 FINAL  Final  Blood Culture ID Panel (Reflexed)     Status: Abnormal   Collection Time: 02/15/18  6:25 PM  Result Value Ref Range Status   Enterococcus species NOT DETECTED NOT DETECTED Final   Listeria monocytogenes NOT DETECTED NOT DETECTED Final   Staphylococcus species NOT DETECTED NOT DETECTED Final   Staphylococcus aureus NOT DETECTED NOT DETECTED Final   Streptococcus species DETECTED (A) NOT DETECTED Final    Comment: Not Enterococcus species, Streptococcus agalactiae, Streptococcus pyogenes, or Streptococcus pneumoniae. CRITICAL RESULT CALLED TO, READ BACK BY AND VERIFIED WITH: Unk Lightning RN 15:40 02/16/18 ([email or web address])    Streptococcus agalactiae NOT DETECTED NOT DETECTED Final   Streptococcus pneumoniae NOT DETECTED NOT DETECTED Final   Streptococcus pyogenes NOT DETECTED NOT DETECTED Final   Acinetobacter baumannii NOT DETECTED NOT DETECTED Final   Enterobacteriaceae species NOT DETECTED NOT DETECTED Final   Enterobacter cloacae complex NOT DETECTED NOT DETECTED Final   Escherichia coli NOT DETECTED NOT DETECTED Final   Klebsiella oxytoca NOT DETECTED NOT DETECTED Final   Klebsiella pneumoniae NOT DETECTED NOT DETECTED Final   Proteus species NOT DETECTED NOT DETECTED Final   Serratia marcescens NOT DETECTED NOT DETECTED Final   Haemophilus influenzae NOT DETECTED NOT DETECTED Final   Neisseria meningitidis NOT DETECTED NOT DETECTED Final   Pseudomonas aeruginosa NOT DETECTED NOT DETECTED Final   Candida albicans NOT DETECTED NOT DETECTED Final   Candida glabrata NOT DETECTED NOT DETECTED Final   Candida krusei NOT DETECTED NOT DETECTED Final   Candida parapsilosis NOT DETECTED NOT DETECTED Final   Candida tropicalis NOT  DETECTED NOT DETECTED Final    Comment: Performed at [facility], [place] [address], [place], [place] [address]  Culture, blood (Routine x 2)     Status: Abnormal   Collection Time: 02/10/2018  9:00 PM  Result Value Ref Range Status   Specimen Description BLOOD RIGHT ANTECUBITAL  Final   Special Requests   Final    BOTTLES DRAWN AEROBIC AND ANAEROBIC Blood Culture adequate volume   Culture  Setup Time   Final    GRAM POSITIVE COCCI IN CHAINS IN BOTH AEROBIC AND ANAEROBIC BOTTLES CRITICAL RESULT CALLED TO, READ BACK BY AND VERIFIED WITH: E SINCLAIR,PHARMD AT 1258 02/18/18 BY L BENFIELD Performed at [facility], [place] [address], [place], [place] [address]    Culture VIRIDANS STREPTOCOCCUS (A)  Final   Report Status 02/20/2018 FINAL  Final   Organism ID, Bacteria VIRIDANS STREPTOCOCCUS  Final      Susceptibility   Viridans streptococcus - MIC*    TETRACYCLINE >=16 RESISTANT Resistant     VANCOMYCIN 0.5 SENSITIVE Sensitive     CLINDAMYCIN >=1 RESISTANT Resistant     PENICILLIN 0.12 SENSITIVE Sensitive     CEFTRIAXONE <=0.12 SENSITIVE Sensitive     ERYTHROMYCIN >=8 RESISTANT Resistant     LEVOFLOXACIN 0.5 SENSITIVE Sensitive     * VIRIDANS STREPTOCOCCUS  Blood Culture ID Panel (Reflexed)     Status: Abnormal   Collection Time: 02/14/2018  9:00 PM  Result Value Ref Range Status   Enterococcus species NOT DETECTED NOT DETECTED Final   Listeria monocytogenes NOT DETECTED NOT DETECTED Final   Staphylococcus species NOT DETECTED NOT DETECTED Final   Staphylococcus aureus NOT DETECTED NOT DETECTED Final   Streptococcus species DETECTED (A) NOT DETECTED Final    Comment: Not Enterococcus species, Streptococcus agalactiae, Streptococcus pyogenes, or Streptococcus pneumoniae. CRITICAL RESULT CALLED TO, READ BACK BY AND VERIFIED WITH: E SINCLAIR,PHARMD AT 1258 02/18/18 BY L BENFIELD    Streptococcus agalactiae NOT DETECTED NOT DETECTED Final   Streptococcus pneumoniae NOT DETECTED NOT  DETECTED Final   Streptococcus pyogenes NOT DETECTED NOT DETECTED Final   Acinetobacter baumannii NOT DETECTED NOT DETECTED Final   Enterobacteriaceae species NOT DETECTED NOT DETECTED Final   Enterobacter cloacae complex NOT DETECTED NOT DETECTED Final   Escherichia coli NOT DETECTED NOT DETECTED Final   Klebsiella oxytoca NOT DETECTED NOT DETECTED Final   Klebsiella pneumoniae NOT DETECTED NOT DETECTED Final   Proteus species NOT DETECTED NOT DETECTED Final   Serratia marcescens NOT DETECTED NOT DETECTED Final   Haemophilus influenzae NOT DETECTED NOT DETECTED Final   Neisseria meningitidis NOT DETECTED NOT DETECTED Final   Pseudomonas aeruginosa NOT DETECTED NOT DETECTED Final   Candida albicans NOT DETECTED NOT DETECTED Final   Candida glabrata NOT DETECTED NOT DETECTED Final   Candida krusei NOT DETECTED NOT DETECTED Final   Candida parapsilosis NOT DETECTED NOT DETECTED Final   Candida tropicalis NOT DETECTED NOT DETECTED Final    Comment: Performed at [facility], [place] [address] [place], [place] [address]  Culture, blood (Routine x 2)     Status: Abnormal   Collection Time: 02/10/2018 10:19 PM  Result Value Ref Range Status   Specimen Description BLOOD RIGHT FOREARM  Final   Special Requests   Final    BOTTLES DRAWN AEROBIC AND ANAEROBIC Blood Culture adequate volume   Culture  Setup Time   Final    GRAM POSITIVE COCCI IN CHAINS IN BOTH AEROBIC AND ANAEROBIC BOTTLES CRITICAL RESULT CALLED TO, READ BACK BY AND VERIFIED WITH: E. SINCLAIR PHARMD, AT 1339 02/18/18 BY D. VANHOOK    Culture (A)  Final    VIRIDANS STREPTOCOCCUS SUSCEPTIBILITIES PERFORMED ON PREVIOUS CULTURE WITHIN THE LAST 5 DAYS. Performed at [facility], [place] [address], [place], [place] [address]    Report Status 02/20/2018 FINAL  Final  MRSA PCR Screening     Status: None   Collection Time: 02/18/18  9:16 PM  Result Value Ref Range Status   MRSA by PCR NEGATIVE NEGATIVE Final    Comment:          The GeneXpert MRSA Assay (FDA approved for NASAL specimens only), is one component  of a comprehensive MRSA colonization surveillance program. It is not intended to diagnose MRSA infection nor to guide or monitor treatment for MRSA infections. Performed at [facility], [place] [address] [place], [place] [address]     ASSESSMENT: 52 y.o. male with endocarditis of his bicuspid aortic valve, viridans streptococci bacteremia (PCN MIC = 0.12) and current IV drug use (previously heroin and now uses "molly"). I do have some concern about CNS involvement given his weakness and left sided apraxia. High dose penicillin will treat any potential neurologic embolization. He is on supplemental oxygen following his TEE but lung exam clear. Does not appear to be fluid overloaded to me. Although his injection drug use is certainly a problem, with suspicion for CNS involvement would consult with CT surgery for consideration of AVR.   PLAN: 1. Continue penicillin G infusion  2. MRI of the brain when stable/able 3. TCTS consult   Janene Madeira, MSN, NP-C [facility] [place] Cell: [phone] Pager: [phone]  02/10/2018  11:10 AM

## 2018-02-21 NOTE — Progress Notes (Signed)
Attempted report to 2C.  

## 2018-02-21 NOTE — Interval H&P Note (Signed)
History and Physical Interval Note:  03/01/2018 9:19 AM  Aaron Dodson  has presented today for surgery, with the diagnosis of BACTEREMIA  The various methods of treatment have been discussed with the patient and family. After consideration of risks, benefits and other options for treatment, the patient has consented to  Procedure(s): TRANSESOPHAGEAL ECHOCARDIOGRAM (TEE) (N/A) as a surgical intervention .  The patient's history has been reviewed, patient examined, no change in status, stable for surgery.  I have reviewed the patient's chart and labs.  Questions were answered to the patient's satisfaction.     [employer]

## 2018-02-21 NOTE — Progress Notes (Signed)
MD said to wait on giving the penicillin due to patient receiving bolus.

## 2018-02-21 NOTE — Anesthesia Preprocedure Evaluation (Addendum)
Anesthesia Evaluation  Patient identified by MRN, date of birth, ID band Patient awake    Reviewed: Allergy & Precautions, NPO status , Patient's Chart, lab work & pertinent test results  Airway Mallampati: II  TM Distance: >3 FB Neck ROM: Full    Dental no notable dental hx. (+) Edentulous Upper, Edentulous Lower   Pulmonary neg pulmonary ROS, shortness of breath, COPD, Current Smoker,    Pulmonary exam normal breath sounds clear to auscultation       Cardiovascular hypertension, Pt. on medications + angina + CAD  negative cardio ROS Normal cardiovascular exam Rhythm:Regular Rate:Normal     Neuro/Psych Anxiety TIAnegative neurological ROS  negative psych ROS   GI/Hepatic negative GI ROS, Neg liver ROS, GERD  ,  Endo/Other  negative endocrine ROS  Renal/GU negative Renal ROS  negative genitourinary   Musculoskeletal negative musculoskeletal ROS (+)   Abdominal   Peds negative pediatric ROS (+)  Hematology negative hematology ROS (+) anemia ,   Anesthesia Other Findings   Reproductive/Obstetrics negative OB ROS                            Anesthesia Physical Anesthesia Plan  ASA: III  Anesthesia Plan: MAC   Post-op Pain Management:    Induction: Intravenous  PONV Risk Score and Plan: Treatment may vary due to age or medical condition  Airway Management Planned: Nasal Cannula  Additional Equipment:   Intra-op Plan:   Post-operative Plan:   Informed Consent: I have reviewed the patients History and Physical, chart, labs and discussed the procedure including the risks, benefits and alternatives for the proposed anesthesia with the patient or authorized representative who has indicated his/her understanding and acceptance.   Dental advisory given  Plan Discussed with: CRNA  Anesthesia Plan Comments:         Anesthesia Quick Evaluation

## 2018-02-21 NOTE — Anesthesia Postprocedure Evaluation (Signed)
Anesthesia Post Note  Patient: Aaron Dodson  Procedure(s) Performed: TRANSESOPHAGEAL ECHOCARDIOGRAM (TEE) (N/A )     Patient location during evaluation: PACU Anesthesia Type: MAC Level of consciousness: awake and alert Pain management: pain level controlled Vital Signs Assessment: post-procedure vital signs reviewed and stable Respiratory status: spontaneous breathing, nonlabored ventilation and respiratory function stable Cardiovascular status: stable and blood pressure returned to baseline Postop Assessment: no apparent nausea or vomiting Anesthetic complications: no    Last Vitals:  Vitals:   03/02/2018 1005 03/01/2018 1010  BP: (!) 91/44 (!) 103/46  Pulse: 86 88  Resp: (!) 29 (!) 29  Temp:    SpO2: 98% 97%    Last Pain:  Vitals:   02/20/2018 0940  TempSrc: Oral  PainSc:                  Lynda Rainwater

## 2018-02-21 NOTE — Progress Notes (Signed)
Report was given  

## 2018-02-21 NOTE — Care Management Note (Signed)
Case Management Note  Patient Details  Name: Aaron Dodson MRN: [id number] Date of Birth: [birth year]  Subjective/Objective:     Presented with fever,DOE/ Sepsis/ bacteremia, hx of IV drug use, coronary artery disease with stents, chronic pain, and anxiety. Independent with ADL's , no DME usage PTA.          Darrick Penna [name]) [name] (Daughter)    [phone]       Action/Plan: TEE 4/16 ... NCM following for disposition needs.  Expected Discharge Date:                  Expected Discharge Plan:  Home/Self Care  In-House Referral:  Clinical Social Work  Discharge planning Services  CM Consult  Post Acute Care Choice:    Choice offered to:     DME Arranged:    DME Agency:     HH Arranged:    HH Agency:     Status of Service:  In process, will continue to follow  If discussed at Long Length of Stay Meetings, dates discussed:    Additional Comments:  Sharin Mons, RN 03/01/2018, 10:34 AM

## 2018-02-21 NOTE — CV Procedure (Addendum)
   Transesophageal Echocardiogram  Indications: Bacteremia  Time out performed  Anesthesia present, propofol Findings:  Left Ventricle: Ejection fraction 50-55%  Mitral Valve: Mild to moderate mitral regurgitation  Aortic Valve: Severe aortic regurgitation, functionally bicuspid valve with left and right cusp effusion.  Heavy dense circumferential calcification noted measuring 1.6 x 0.6 cm, likely calcified vegetation present.  3D image eval performed.  Tricuspid Valve: No vegetation, no regurgitation  Left Atrium: Normal    Candee Furbish, MD

## 2018-02-22 ENCOUNTER — Other Ambulatory Visit: Payer: Self-pay | Admitting: *Deleted

## 2018-02-22 ENCOUNTER — Inpatient Hospital Stay (HOSPITAL_COMMUNITY): Payer: Medicare HMO

## 2018-02-22 ENCOUNTER — Encounter (HOSPITAL_COMMUNITY): Payer: Self-pay | Admitting: Cardiology

## 2018-02-22 DIAGNOSIS — J441 Chronic obstructive pulmonary disease with (acute) exacerbation: Secondary | ICD-10-CM

## 2018-02-22 DIAGNOSIS — R0602 Shortness of breath: Secondary | ICD-10-CM

## 2018-02-22 DIAGNOSIS — R0902 Hypoxemia: Secondary | ICD-10-CM

## 2018-02-22 DIAGNOSIS — B1920 Unspecified viral hepatitis C without hepatic coma: Secondary | ICD-10-CM

## 2018-02-22 DIAGNOSIS — J810 Acute pulmonary edema: Secondary | ICD-10-CM

## 2018-02-22 DIAGNOSIS — I358 Other nonrheumatic aortic valve disorders: Secondary | ICD-10-CM

## 2018-02-22 DIAGNOSIS — Z01810 Encounter for preprocedural cardiovascular examination: Secondary | ICD-10-CM

## 2018-02-22 LAB — CBC WITH DIFFERENTIAL/PLATELET
BASOS ABS: 0 10*3/uL (ref 0.0–0.1)
BASOS PCT: 0 %
EOS ABS: 0.2 10*3/uL (ref 0.0–0.7)
EOS PCT: 2 %
HCT: 23.1 % — ABNORMAL LOW (ref 39.0–52.0)
HEMOGLOBIN: 7.8 g/dL — AB (ref 13.0–17.0)
Lymphocytes Relative: 17 %
Lymphs Abs: 1.6 10*3/uL (ref 0.7–4.0)
MCH: 29.7 pg (ref 26.0–34.0)
MCHC: 33.8 g/dL (ref 30.0–36.0)
MCV: 87.8 fL (ref 78.0–100.0)
Monocytes Absolute: 0.4 10*3/uL (ref 0.1–1.0)
Monocytes Relative: 5 %
NEUTROS PCT: 76 %
Neutro Abs: 7 10*3/uL (ref 1.7–7.7)
PLATELETS: 200 10*3/uL (ref 150–400)
RBC: 2.63 MIL/uL — AB (ref 4.22–5.81)
RDW: 16.5 % — ABNORMAL HIGH (ref 11.5–15.5)
WBC: 9.2 10*3/uL (ref 4.0–10.5)

## 2018-02-22 LAB — BASIC METABOLIC PANEL
ANION GAP: 10 (ref 5–15)
BUN: 5 mg/dL — ABNORMAL LOW (ref 6–20)
CHLORIDE: 100 mmol/L — AB (ref 101–111)
CO2: 22 mmol/L (ref 22–32)
Calcium: 8.5 mg/dL — ABNORMAL LOW (ref 8.9–10.3)
Creatinine, Ser: 1.13 mg/dL (ref 0.61–1.24)
Glucose, Bld: 129 mg/dL — ABNORMAL HIGH (ref 65–99)
POTASSIUM: 3.4 mmol/L — AB (ref 3.5–5.1)
SODIUM: 132 mmol/L — AB (ref 135–145)

## 2018-02-22 LAB — HEPATITIS C ANTIBODY (REFLEX)

## 2018-02-22 LAB — HEPATITIS PANEL, ACUTE
HEP A IGM: NEGATIVE
HEP B S AG: NEGATIVE
Hep B C IgM: NEGATIVE

## 2018-02-22 LAB — COMMENT2 - HEP PANEL

## 2018-02-22 LAB — HIV ANTIBODY (ROUTINE TESTING W REFLEX): HIV Screen 4th Generation wRfx: NONREACTIVE

## 2018-02-22 LAB — HEPATITIS B SURFACE ANTIGEN: HEP B S AG: NEGATIVE

## 2018-02-22 LAB — ABO/RH: ABO/RH(D): A POS

## 2018-02-22 LAB — HEPARIN LEVEL (UNFRACTIONATED): HEPARIN UNFRACTIONATED: 0.42 [IU]/mL (ref 0.30–0.70)

## 2018-02-22 LAB — PREPARE RBC (CROSSMATCH)

## 2018-02-22 MED ORDER — FUROSEMIDE 10 MG/ML IJ SOLN
20.0000 mg | Freq: Once | INTRAMUSCULAR | Status: AC
  Administered 2018-02-22: 20 mg via INTRAVENOUS
  Filled 2018-02-22: qty 2

## 2018-02-22 MED ORDER — SODIUM CHLORIDE 0.9 % IV SOLN: Freq: Once | INTRAVENOUS | Status: DC

## 2018-02-22 NOTE — Progress Notes (Signed)
[facility]                                                                              Patient Demographics  Aaron Dodson, is a 52 y.o. male, DOB - 02/02/1966, VOH:[id number]  Admit date - 02/11/2018   Admitting Physician Vianne Bulls, MD  Outpatient Primary MD for the patient is Patient, No Pcp Per  Outpatient specialists:   LOS - 4  days   Medical records reviewed and are as summarized below:    Chief Complaint  Patient presents with  . Chest Pain       Brief summary   52 y.o.malew/ a remote hx ofIV drug use (no use in years per pt), prior tx for endocarditis, CAD with stents, chronic pain, and anxiety who presented to the ED w/ fevers "for a few months" and exertional dyspnea. Patient had been previously evaluated w/ blood cultures growing Strep viridans, but his management was complicated by his leaving the ED AMA on 4/10.  In the ED this admit the patient was found to have temp of 100 F,bp 80/37, and heart rate of 125. Chest x-ray was negative for edema or consolidation. Lactic acid was elevated to 5.17 and troponin was elevated to 0.26. CTA chest was negative for PE, but notable for centrilobular emphysema and a stable 7 mm nodule in the left apex.   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   -2D echo showed EF of 55-60% with basal inferior akinesis, large vegetation of aortic valve with severe regurgitation, normal RV size and function -TEE showed severe aortic regurgitation with heavy dense circumferential calcified vegetation 1.6x 0.6 cm.  -Consulted CTVS, appreciate recommendations, recommended  cardiac cath -MRI brain still pending rule out septic emboli to brain -Post TEE, due to hypotension, patient received IV fluid hydration and brief vasopressors and in the unit.  He developed acute respiratory distress, chest x-ray showed pulmonary edema, received Lasix 40 mg IV. -Today appears to be much improved, O2 sats 97% on room air  Active Problems: Elevated troponins with history of CAD  -Patient had last cardiac cath February 2018 which showed diffuse mild to moderate disease and conservative management recommended -- Cardiology following, CT VS recommended cardiac cath prior to surgical intervention for endocarditis    Pain syndrome, chronic -Currently on oxycodone IR, morphine IV as needed -MRI cervical, thoracic, lumbar spine showed no discitis, osteomyelitis or abscess.    IV drug abuse ([place]) -Per patient he has been clean for the last 3 years, previously used IV heroin.  However he reported to cardiology PA that he had been using IV "molly" (MDMA) -Discussed in detail, with patient and wife, appears to be motivated to stay clean.  He agreed for skilled nursing facility for IV antibiotics versus inpatient.   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 aspirin EC  81 mg  Oral Daily  . atorvastatin  40 mg Oral q1800  . feeding supplement (ENSURE ENLIVE)  237 mL Oral BID BM  . ipratropium-albuterol  3 mL Nebulization Q6H  . nicotine  21 mg Transdermal Daily  . pantoprazole  40 mg Oral Daily   Continuous Infusions: . sodium chloride Stopped (03/03/2018 1442)  . heparin 1,850 Units/hr (02/22/18 0700)  . pencillin G potassium IV Stopped (02/22/18 1130)   PRN Meds:.acetaminophen, albuterol, ALPRAZolam, cyclobenzaprine, loperamide, morphine injection, nitroGLYCERIN, ondansetron (ZOFRAN) IV, oxyCODONE, zolpidem   Antibiotics   Anti-infectives (From admission, onward)   Start     Dose/Rate Route Frequency Ordered Stop   02/20/18 2000  penicillin G potassium 4 Million Units in dextrose 5 % 250 mL IVPB     4 Million Units 250 mL/hr over 60 Minutes Intravenous Every 4 hours 02/20/18 1355     02/18/18 2200  cefTRIAXone (ROCEPHIN) 2 g in sodium chloride 0.9 % 100 mL IVPB  Status:  Discontinued     2 g 200 mL/hr over 30 Minutes Intravenous Every 24 hours 02/18/18 0439 02/20/18 1355   03/02/2018 2245  cefTRIAXone (ROCEPHIN) 2 g in sodium chloride 0.9 % 100 mL IVPB     2 g 200 mL/hr over 30 Minutes Intravenous  Once 02/10/2018 2243 02/18/18 0008        Subjective:   Aaron Dodson was seen and examined today.  Feels a lot better today, breathing better, low-grade temp of 100.2 F this morning.  Denies any chest pain, abdominal pain, N/V/D/C, new weakness, numbess, tingling.    Objective:   Vitals:   02/22/18 0007 02/22/18 0458 02/22/18 0541 02/22/18 0824  BP: (!) 108/54  (!) 94/39 (!) 113/56  Pulse: (!) 107  91 99  Resp: (!) 27  (!) 21 (!) 21  Temp: 100.2 F (37.9 C)  98.9 F (37.2 C) 99.9 F (37.7 C)  TempSrc: Oral  Oral Oral  SpO2: 96%  97% 97%  Weight:  57.8 kg (127 lb 6.8 oz)    Height:        Intake/Output Summary (Last 24 hours) at 02/22/2018 1139 Last data filed at 02/22/2018 0700 Gross per 24 hour  Intake 2843.14 ml  Output 3810 ml  Net  -966.86 ml     Wt Readings from Last 3 Encounters:  02/22/18 57.8 kg (127 lb 6.8 oz)  02/16/18 70.8 kg (156 lb)  02/15/18 70.8 kg (156 lb)     Exam   General: Alert and oriented x 3, NAD  Eyes:   HEENT:    Cardiovascular: S1 S2 auscultated, 3/6 murmur. Regular rate and rhythm. No pedal edema b/l  Respiratory: Decreased breath sound at the bases however no rhonchi or wheezing  Gastrointestinal: Soft,  nontender, nondistended, + bowel sounds  Ext: no pedal edema bilaterally  Neuro: no new deficits  Musculoskeletal: No digital cyanosis, clubbing  Skin: No rashes  Psych: Normal affect and demeanor, alert and oriented x3     Data Reviewed:  I have personally reviewed following labs and imaging studies  Micro Results Recent Results (from the past 240 hour(s))  Blood culture (routine x 2)     Status: Abnormal   Collection Time: 02/15/18  6:25 PM  Result Value Ref Range Status   Specimen Description BLOOD RIGHT ANTECUBITAL  Final   Special Requests   Final    BOTTLES DRAWN AEROBIC AND ANAEROBIC Blood Culture adequate volume   Culture  Setup Time   Final    GRAM POSITIVE COCCI IN CHAINS AEROBIC BOTTLE ONLY CRITICAL RESULT CALLED TO, READ BACK BY AND VERIFIED WITH: Unk Lightning RN 15:40 02/16/18 ([email or web address])    Culture (A)  Final    VIRIDANS STREPTOCOCCUS THE SIGNIFICANCE OF ISOLATING THIS ORGANISM FROM A SINGLE VENIPUNCTURE CANNOT BE PREDICTED WITHOUT FURTHER CLINICAL AND CULTURE CORRELATION. SUSCEPTIBILITIES AVAILABLE ONLY ON REQUEST. Performed at [facility], [place] [address] [place], [place] [address]    Report Status 02/18/2018 FINAL  Final  Blood Culture ID Panel (Reflexed)     Status: Abnormal   Collection Time: 02/15/18  6:25 PM  Result Value Ref Range Status   Enterococcus species NOT DETECTED NOT DETECTED Final   Listeria monocytogenes NOT DETECTED NOT DETECTED Final   Staphylococcus species NOT DETECTED NOT DETECTED Final   Staphylococcus aureus NOT  DETECTED NOT DETECTED Final   Streptococcus species DETECTED (A) NOT DETECTED Final    Comment: Not Enterococcus species, Streptococcus agalactiae, Streptococcus pyogenes, or Streptococcus pneumoniae. CRITICAL RESULT CALLED TO, READ BACK BY AND VERIFIED WITH: Unk Lightning RN 15:40 02/16/18 ([email or web address])    Streptococcus agalactiae NOT DETECTED NOT DETECTED Final   Streptococcus pneumoniae NOT DETECTED NOT DETECTED Final   Streptococcus pyogenes NOT DETECTED NOT DETECTED Final   Acinetobacter baumannii NOT DETECTED NOT DETECTED Final   Enterobacteriaceae species NOT DETECTED NOT DETECTED Final   Enterobacter cloacae complex NOT DETECTED NOT DETECTED Final   Escherichia coli NOT DETECTED NOT DETECTED Final   Klebsiella oxytoca NOT DETECTED NOT DETECTED Final   Klebsiella pneumoniae NOT DETECTED NOT DETECTED Final   Proteus species NOT DETECTED NOT DETECTED Final   Serratia marcescens NOT DETECTED NOT DETECTED Final   Haemophilus influenzae NOT DETECTED NOT DETECTED Final   Neisseria meningitidis NOT DETECTED NOT DETECTED Final   Pseudomonas aeruginosa NOT DETECTED NOT DETECTED Final   Candida albicans NOT DETECTED NOT DETECTED Final   Candida glabrata NOT DETECTED NOT DETECTED Final   Candida krusei NOT DETECTED NOT DETECTED Final   Candida parapsilosis NOT DETECTED NOT DETECTED Final   Candida tropicalis NOT DETECTED NOT DETECTED Final    Comment: Performed at [facility], [place] [address] [place], [place] [address]  Culture, blood (Routine x 2)     Status: Abnormal   Collection Time: 02/22/2018  9:00 PM  Result Value Ref Range Status   Specimen Description BLOOD RIGHT ANTECUBITAL  Final   Special Requests   Final    BOTTLES DRAWN AEROBIC AND ANAEROBIC Blood Culture adequate volume   Culture  Setup Time   Final    GRAM POSITIVE COCCI IN CHAINS IN BOTH AEROBIC AND ANAEROBIC BOTTLES CRITICAL RESULT CALLED TO, READ BACK BY AND VERIFIED WITH: E SINCLAIR,PHARMD AT 1258 02/18/18 BY L  BENFIELD Performed at  [facility], [place] [address] [place], [place] [address]    Culture VIRIDANS STREPTOCOCCUS (A)  Final   Report Status 02/20/2018 FINAL  Final   Organism ID, Bacteria VIRIDANS STREPTOCOCCUS  Final      Susceptibility   Viridans streptococcus - MIC*    TETRACYCLINE >=16 RESISTANT Resistant     VANCOMYCIN 0.5 SENSITIVE Sensitive     CLINDAMYCIN >=1 RESISTANT Resistant     PENICILLIN 0.12 SENSITIVE Sensitive     CEFTRIAXONE <=0.12 SENSITIVE Sensitive     ERYTHROMYCIN >=8 RESISTANT Resistant     LEVOFLOXACIN 0.5 SENSITIVE Sensitive     * VIRIDANS STREPTOCOCCUS  Blood Culture ID Panel (Reflexed)     Status: Abnormal   Collection Time: 02/14/2018  9:00 PM  Result Value Ref Range Status   Enterococcus species NOT DETECTED NOT DETECTED Final   Listeria monocytogenes NOT DETECTED NOT DETECTED Final   Staphylococcus species NOT DETECTED NOT DETECTED Final   Staphylococcus aureus NOT DETECTED NOT DETECTED Final   Streptococcus species DETECTED (A) NOT DETECTED Final    Comment: Not Enterococcus species, Streptococcus agalactiae, Streptococcus pyogenes, or Streptococcus pneumoniae. CRITICAL RESULT CALLED TO, READ BACK BY AND VERIFIED WITH: E SINCLAIR,PHARMD AT 1258 02/18/18 BY L BENFIELD    Streptococcus agalactiae NOT DETECTED NOT DETECTED Final   Streptococcus pneumoniae NOT DETECTED NOT DETECTED Final   Streptococcus pyogenes NOT DETECTED NOT DETECTED Final   Acinetobacter baumannii NOT DETECTED NOT DETECTED Final   Enterobacteriaceae species NOT DETECTED NOT DETECTED Final   Enterobacter cloacae complex NOT DETECTED NOT DETECTED Final   Escherichia coli NOT DETECTED NOT DETECTED Final   Klebsiella oxytoca NOT DETECTED NOT DETECTED Final   Klebsiella pneumoniae NOT DETECTED NOT DETECTED Final   Proteus species NOT DETECTED NOT DETECTED Final   Serratia marcescens NOT DETECTED NOT DETECTED Final   Haemophilus influenzae NOT DETECTED NOT DETECTED Final   Neisseria  meningitidis NOT DETECTED NOT DETECTED Final   Pseudomonas aeruginosa NOT DETECTED NOT DETECTED Final   Candida albicans NOT DETECTED NOT DETECTED Final   Candida glabrata NOT DETECTED NOT DETECTED Final   Candida krusei NOT DETECTED NOT DETECTED Final   Candida parapsilosis NOT DETECTED NOT DETECTED Final   Candida tropicalis NOT DETECTED NOT DETECTED Final    Comment: Performed at [facility], [place] [address], [place], [place] [address]  Culture, blood (Routine x 2)     Status: Abnormal   Collection Time: 02/06/2018 10:19 PM  Result Value Ref Range Status   Specimen Description BLOOD RIGHT FOREARM  Final   Special Requests   Final    BOTTLES DRAWN AEROBIC AND ANAEROBIC Blood Culture adequate volume   Culture  Setup Time   Final    GRAM POSITIVE COCCI IN CHAINS IN BOTH AEROBIC AND ANAEROBIC BOTTLES CRITICAL RESULT CALLED TO, READ BACK BY AND VERIFIED WITH: E. SINCLAIR PHARMD, AT 1339 02/18/18 BY D. VANHOOK    Culture (A)  Final    VIRIDANS STREPTOCOCCUS SUSCEPTIBILITIES PERFORMED ON PREVIOUS CULTURE WITHIN THE LAST 5 DAYS. Performed at [facility], [place] [address], [place], [place] [address]    Report Status 02/20/2018 FINAL  Final  MRSA PCR Screening     Status: None   Collection Time: 02/18/18  9:16 PM  Result Value Ref Range Status   MRSA by PCR NEGATIVE NEGATIVE Final    Comment:        The GeneXpert MRSA Assay (FDA approved for NASAL specimens only), is one component  of a comprehensive MRSA colonization surveillance program. It is not intended to diagnose MRSA infection nor to guide or monitor treatment for MRSA infections. Performed at [facility], [place] [address] [place], [place] [address]   Culture, blood (routine x 2)     Status: None (Preliminary result)   Collection Time: 02/20/18  2:07 PM  Result Value Ref Range Status   Specimen Description BLOOD LEFT HAND  Final   Special Requests   Final    BOTTLES DRAWN AEROBIC ONLY Blood Culture adequate  volume   Culture   Final    NO GROWTH 1 DAY Performed at [facility], [place] [address], [place], [place] [address]    Report Status PENDING  Incomplete  Culture, blood (routine x 2)     Status: None (Preliminary result)   Collection Time: 02/20/18  2:14 PM  Result Value Ref Range Status   Specimen Description BLOOD RIGHT HAND  Final   Special Requests   Final    BOTTLES DRAWN AEROBIC ONLY Blood Culture adequate volume   Culture   Final    NO GROWTH 1 DAY Performed at [facility], [place] [address], [place], [place] [address]    Report Status PENDING  Incomplete    Radiology Reports Dg Chest 2 View  Result Date: 03/04/2018 CLINICAL DATA:  Shortness of breath and fever EXAM: CHEST - 2 VIEW COMPARISON:  February 15, 2018 FINDINGS: There is no edema or consolidation. The heart size and pulmonary vascularity are normal. No adenopathy. There are multiple foci of coronary artery calcification. No bone lesions. IMPRESSION: Extensive coronary artery calcification.  No edema or consolidation. Electronically Signed   By: Lowella Grip III M.D.   On: 02/20/2018 21:25   Dg Chest 2 View  Result Date: 02/15/2018 CLINICAL DATA:  Productive cough x2 weeks. Low-grade fever x3 weeks. EXAM: CHEST - 2 VIEW COMPARISON:  05/05/2017 FINDINGS: The heart size and mediastinal contours are within normal limits. Coronary stent noted on the lateral view. No CHF or pulmonary consolidations. Mild emphysematous hyperinflation of the lungs, upper lobe predominant. Mild chronic interstitial prominence. The visualized skeletal structures are unremarkable. IMPRESSION: No active cardiopulmonary disease. Mild emphysematous hyperinflation of the lungs, upper lobe predominant. Electronically Signed   By: Ashley Royalty M.D.   On: 02/15/2018 19:24   Ct Angio Chest Pe W And/or Wo Contrast  Result Date: 02/28/2018 CLINICAL DATA:  Pericarditis, difficulty breathing with fevers. History of IVDA. EXAM: CT ANGIOGRAPHY CHEST  WITH CONTRAST TECHNIQUE: Multidetector CT imaging of the chest was performed using the standard protocol during bolus administration of intravenous contrast. Multiplanar CT image reconstructions and MIPs were obtained to evaluate the vascular anatomy. CONTRAST:  110mL ISOVUE-370 IOPAMIDOL (ISOVUE-370) INJECTION 76% COMPARISON:  11/09/2016 CT FINDINGS: Cardiovascular: The study is of quality for the evaluation of pulmonary embolism. There are no filling defects in the central, lobar, segmental or subsegmental pulmonary artery branches to suggest acute pulmonary embolism. Great vessels are normal in course and caliber. Top-normal heart size. No significant pericardial fluid/thickening. Mediastinum/Nodes: No discrete thyroid nodules. Unremarkable esophagus. No pathologically enlarged axillary, mediastinal or hilar lymph nodes. Lungs/Pleura: No pneumothorax. No pleural effusion. Stable 7 mm nodular density in the medial left upper lobe, series 6/20. Centrilobular emphysema without acute pneumonic consolidation. Bibasilar dependent atelectasis. Upper abdomen: Unremarkable. Musculoskeletal:  No aggressive appearing focal osseous lesions. Review of the MIP images confirms the above findings. IMPRESSION: 1. No acute pulmonary embolus. 2. Centrilobular emphysema. 3. Stable 7  mm nodular density in the medial left lung apex. As this appears stable since 11/09/2016, additional follow-up at 18-24 months from the 11/09/2016 study is recommended in high risk patients which given patient's COPD and IVDA history, would be recommended. This recommendation follows the consensus statement: Guidelines for Management of Incidental Pulmonary Nodules Detected on CT Images: From the Fleischner Society 2017; Radiology 2017; 284:228-243. Emphysema (ICD10-J43.9). Electronically Signed   By: Ashley Royalty M.D.   On: 02/25/2018 23:15   Mr Cervical Spine W Wo Contrast  Result Date: 02/18/2018 CLINICAL DATA:  Infective endocarditis. EXAM: MRI  TOTAL SPINE WITHOUT AND WITH CONTRAST TECHNIQUE: Multisequence MR imaging of the spine from the cervical spine to the sacrum was performed prior to and following IV contrast administration. CONTRAST:  4mL MULTIHANCE GADOBENATE DIMEGLUMINE 529 MG/ML IV SOLN COMPARISON:  None. FINDINGS: MRI CERVICAL SPINE FINDINGS Alignment: Mild C3-4 anterolisthesis. Vertebrae: No fracture, evidence of discitis, or bone lesion.Diffuse hypointense marrow likely related to patient's anemia. Cord: Normal signal and morphology. Posterior Fossa, vertebral arteries, paraspinal tissues: No convincing inflammation. Disc levels: Shallow disc protrusions at C3-4, C5-6, and C6-7. No visible cord impingement. Limited assessment of foraminal narrowing due to motion. MRI THORACIC SPINE FINDINGS Alignment:  Normal. Vertebrae: No evidence of discitis, fracture, or aggressive lesion. Diffuse hypointense marrow likely related to patient's anemia. Cord:  Normal signal and morphology. Paraspinal and other soft tissues: No visible abscess or inflammation. Disc levels: Tiny T8-9 disc protrusion.  No impingement. MRI LUMBAR SPINE FINDINGS Segmentation:  5 lumbar type vertebral bodies. Alignment:  Normal. Vertebrae: Diffusely hypointense marrow as above. No evidence of discitis, fracture, or bone lesion. Conus medullaris: Extends to the L1-2 level and appears normal. Paraspinal and other soft tissues: Nonspecific retroperitoneal and subcutaneous edema. No visible abscess or discrete inflammation. Disc levels: Good disc height and hydration.  Negative facets.  No impingement. There is pervasive motion artifact in this patient with pain. Pathologic findings could be obscured. IMPRESSION: 1. No visible discitis/osteomyelitis or abscess. No acute finding throughout the visualized spine. 2. Extensive motion degradation. Electronically Signed   By: Monte Fantasia M.D.   On: 02/18/2018 21:12   Mr Thoracic Spine W Wo Contrast  Result Date: 02/18/2018 CLINICAL  DATA:  Infective endocarditis. EXAM: MRI TOTAL SPINE WITHOUT AND WITH CONTRAST TECHNIQUE: Multisequence MR imaging of the spine from the cervical spine to the sacrum was performed prior to and following IV contrast administration. CONTRAST:  87mL MULTIHANCE GADOBENATE DIMEGLUMINE 529 MG/ML IV SOLN COMPARISON:  None. FINDINGS: MRI CERVICAL SPINE FINDINGS Alignment: Mild C3-4 anterolisthesis. Vertebrae: No fracture, evidence of discitis, or bone lesion.Diffuse hypointense marrow likely related to patient's anemia. Cord: Normal signal and morphology. Posterior Fossa, vertebral arteries, paraspinal tissues: No convincing inflammation. Disc levels: Shallow disc protrusions at C3-4, C5-6, and C6-7. No visible cord impingement. Limited assessment of foraminal narrowing due to motion. MRI THORACIC SPINE FINDINGS Alignment:  Normal. Vertebrae: No evidence of discitis, fracture, or aggressive lesion. Diffuse hypointense marrow likely related to patient's anemia. Cord:  Normal signal and morphology. Paraspinal and other soft tissues: No visible abscess or inflammation. Disc levels: Tiny T8-9 disc protrusion.  No impingement. MRI LUMBAR SPINE FINDINGS Segmentation:  5 lumbar type vertebral bodies. Alignment:  Normal. Vertebrae: Diffusely hypointense marrow as above. No evidence of discitis, fracture, or bone lesion. Conus medullaris: Extends to the L1-2 level and appears normal. Paraspinal and other soft tissues: Nonspecific retroperitoneal and subcutaneous edema. No visible abscess or discrete inflammation. Disc levels: Good disc height and hydration.  Negative facets.  No impingement. There is pervasive motion artifact in this patient with pain. Pathologic findings could be obscured. IMPRESSION: 1. No visible discitis/osteomyelitis or abscess. No acute finding throughout the visualized spine. 2. Extensive motion degradation. Electronically Signed   By: Monte Fantasia M.D.   On: 02/18/2018 21:12   Mr Lumbar Spine W Wo  Contrast  Result Date: 02/18/2018 CLINICAL DATA:  Infective endocarditis. EXAM: MRI TOTAL SPINE WITHOUT AND WITH CONTRAST TECHNIQUE: Multisequence MR imaging of the spine from the cervical spine to the sacrum was performed prior to and following IV contrast administration. CONTRAST:  34mL MULTIHANCE GADOBENATE DIMEGLUMINE 529 MG/ML IV SOLN COMPARISON:  None. FINDINGS: MRI CERVICAL SPINE FINDINGS Alignment: Mild C3-4 anterolisthesis. Vertebrae: No fracture, evidence of discitis, or bone lesion.Diffuse hypointense marrow likely related to patient's anemia. Cord: Normal signal and morphology. Posterior Fossa, vertebral arteries, paraspinal tissues: No convincing inflammation. Disc levels: Shallow disc protrusions at C3-4, C5-6, and C6-7. No visible cord impingement. Limited assessment of foraminal narrowing due to motion. MRI THORACIC SPINE FINDINGS Alignment:  Normal. Vertebrae: No evidence of discitis, fracture, or aggressive lesion. Diffuse hypointense marrow likely related to patient's anemia. Cord:  Normal signal and morphology. Paraspinal and other soft tissues: No visible abscess or inflammation. Disc levels: Tiny T8-9 disc protrusion.  No impingement. MRI LUMBAR SPINE FINDINGS Segmentation:  5 lumbar type vertebral bodies. Alignment:  Normal. Vertebrae: Diffusely hypointense marrow as above. No evidence of discitis, fracture, or bone lesion. Conus medullaris: Extends to the L1-2 level and appears normal. Paraspinal and other soft tissues: Nonspecific retroperitoneal and subcutaneous edema. No visible abscess or discrete inflammation. Disc levels: Good disc height and hydration.  Negative facets.  No impingement. There is pervasive motion artifact in this patient with pain. Pathologic findings could be obscured. IMPRESSION: 1. No visible discitis/osteomyelitis or abscess. No acute finding throughout the visualized spine. 2. Extensive motion degradation. Electronically Signed   By: Monte Fantasia M.D.   On:  02/18/2018 21:12   Dg Chest Port 1 View  Result Date: 02/22/2018 CLINICAL DATA:  Shortness of Breath EXAM: PORTABLE CHEST 1 VIEW COMPARISON:  Chest radiograph and chest CT February 17, 2018 FINDINGS: Lungs are mildly hyperexpanded. There is underlying centrilobular emphysematous change, better appreciated on CT. There is currently interstitial edema superimposed on emphysematous change. There is a small focus of airspace opacity in the lateral right base. Heart is enlarged with pulmonary venous hypertension. No adenopathy. No bone lesions. IMPRESSION: The appearance is consistent with a degree of interstitial edema superimposed on underlying emphysema. Suspect a degree of congestive heart failure. Small focus of airspace opacity in the lateral right base may represent early pneumonia superimposed. A small focus of alveolar edema in this area is felt to be less likely. Electronically Signed   By: Lowella Grip III M.D.   On: 02/27/2018 15:22    Lab Data:  CBC: Recent Labs  Lab 02/15/18 1825 02/16/18 2147 02/07/2018 2100 02/19/18 0439 02/20/18 0505 02/22/18 0237  WBC 11.3* 7.4 10.7* 8.7 10.8* 9.2  NEUTROABS 9.7* 5.6  --   --   --  7.0  HGB 10.2* 9.7* 10.4* 8.7* 8.8* 7.8*  HCT 30.8* 28.8* 30.6* 25.7* 26.4* 23.1*  MCV 89.3 88.1 87.2 88.3 88.6 87.8  PLT 178 166 187 186 222 119   Basic Metabolic Panel: Recent Labs  Lab 02/18/18 0446 02/19/18 0439 02/20/18 0505 02/11/2018 1530 02/22/18 0237  NA 132* 137 138 134* 132*  K 3.4* 3.4* 3.1* 3.6 3.4*  CL 99*  108 107 106 100*  CO2 21* 20* 22 18* 22  GLUCOSE 102* 92 107* 133* 129*  BUN 13 6 <5* 6 5*  CREATININE 1.39* 1.13 1.05 1.08 1.13  CALCIUM 9.1 9.1 9.6 8.8* 8.5*  MG  --   --   --  1.4*  --    GFR: Estimated Creatinine Clearance: 63.2 mL/min (by C-G formula based on SCr of 1.13 mg/dL). Liver Function Tests: Recent Labs  Lab 02/15/18 1825 02/16/18 2147 02/27/2018 2220 02/19/18 0439  AST 23 33 31 18  ALT 9* 11* 12* 8*  ALKPHOS 86  78 85 62  BILITOT 0.7 0.7 0.7 0.5  PROT 7.0 6.6 6.3* 5.7*  ALBUMIN 2.4* 2.3* 2.3* 1.9*   No results for input(s): LIPASE, AMYLASE in the last 168 hours. No results for input(s): AMMONIA in the last 168 hours. Coagulation Profile: Recent Labs  Lab 02/19/2018 2100  INR 1.13   Cardiac Enzymes: Recent Labs  Lab 02/18/18 0440 02/18/18 0935 02/18/18 2105 02/19/18 0439  TROPONINI 0.23* 0.18* 0.16* 0.25*   BNP (last 3 results) No results for input(s): PROBNP in the last 8760 hours. HbA1C: No results for input(s): HGBA1C in the last 72 hours. CBG: No results for input(s): GLUCAP in the last 168 hours. Lipid Profile: Recent Labs    02/16/2018 0349  CHOL 118  HDL 15*  LDLCALC 79  TRIG 122  CHOLHDL 7.9   Thyroid Function Tests: No results for input(s): TSH, T4TOTAL, FREET4, T3FREE, THYROIDAB in the last 72 hours. Anemia Panel: No results for input(s): VITAMINB12, FOLATE, FERRITIN, TIBC, IRON, RETICCTPCT in the last 72 hours. Urine analysis:    Component Value Date/Time   COLORURINE YELLOW 02/18/2018 0400   APPEARANCEUR CLEAR 02/18/2018 0400   LABSPEC 1.024 02/18/2018 0400   PHURINE 5.0 02/18/2018 0400   GLUCOSEU NEGATIVE 02/18/2018 0400   HGBUR LARGE (A) 02/18/2018 0400   BILIRUBINUR NEGATIVE 02/18/2018 0400   KETONESUR NEGATIVE 02/18/2018 0400   PROTEINUR NEGATIVE 02/18/2018 0400   NITRITE NEGATIVE 02/18/2018 0400   LEUKOCYTESUR SMALL (A) 02/18/2018 0400     Elise Knobloch M.D. [facility] 02/22/2018, 11:39 AM  Pager: [phone] Between 7am to 7pm - call Pager - [phone]  After 7pm go to [email or web address] - password [email or web address]  Call night coverage person covering after 7pm

## 2018-02-22 NOTE — Progress Notes (Signed)
ANTICOAGULATION CONSULT NOTE - Follow Up Consult  Pharmacy Consult for Heparin Indication: chest pain/ACS  Allergies  Allergen Reactions  . Benadryl [Diphenhydramine Hcl] Other (See Comments)    Aggressive behavior  . Promethazine Hcl Other (See Comments)    Unknown - given for surgical procedure and advised to never take again  . Augmentin [Amoxicillin-Pot Clavulanate] Diarrhea and Nausea Only  . Toradol [Ketorolac Tromethamine] Hives and Other (See Comments)    Redness (also) Patient states he tolerated ibuprofen fine    Patient Measurements: Height: 6' (182.9 cm) Weight: 127 lb 6.8 oz (57.8 kg) IBW/kg (Calculated) : 77.6 Heparin Dosing Weight:  68.1 kg  Vital Signs: Temp: 99.2 F (37.3 C) (04/17 1151) Temp Source: Oral (04/17 1151) BP: 110/60 (04/17 1151) Pulse Rate: 99 (04/17 0824)  Labs: Recent Labs    02/20/18 0505 02/09/2018 0349 02/22/2018 1530 02/22/18 0237  HGB 8.8*  --   --  7.8*  HCT 26.4*  --   --  23.1*  PLT 222  --   --  200  HEPARINUNFRC 0.30 0.47  --  0.42  CREATININE 1.05  --  1.08 1.13    Estimated Creatinine Clearance: 63.2 mL/min (by C-G formula based on SCr of 1.13 mg/dL).  Assessment: 52 yo male on heparin for possible ACS. He is also noted with bacteremia and severe aortic insufficiency.  -heparin level is at goal  Goal of Therapy:  Heparin level 0.3-0.7 units/ml Monitor platelets by anticoagulation protocol: Yes   Plan:  Continue heparin at 1850 units/hr  Will follow plans for length of therapy F/u AM heparin level and CBC  Hildred Laser, PharmD Clinical Pharmacist Clinical phone from 8:30-4:00 is [phone] After 4pm, please call Main Rx [phone]) for assistance. 02/22/2018 12:07 PM

## 2018-02-22 NOTE — Progress Notes (Signed)
Pre-CABG testing has been completed. 1-39% ICA stenosis bilaterally.  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

## 2018-02-22 NOTE — Progress Notes (Signed)
INFECTIOUS DISEASE PROGRESS NOTE  ID: Aaron Dodson is a 52 y.o. male with  Principal Problem:   Aortic valve endocarditis Active Problems:   Chronic coronary artery disease   Pain syndrome, chronic   NSTEMI (non-ST elevated myocardial infarction) (HCC)   IV drug abuse (HCC)   Renal insufficiency   Normocytic anemia   Hypotension   Bacteremia due to Streptococcus   Infection by Streptococcus, viridans group   Septic embolism (HCC)  Subjective: No complaints  Abtx:  Anti-infectives (From admission, onward)   Start     Dose/Rate Route Frequency Ordered Stop   02/20/18 2000  penicillin G potassium 4 Million Units in dextrose 5 % 250 mL IVPB     4 Million Units 250 mL/hr over 60 Minutes Intravenous Every 4 hours 02/20/18 1355     02/18/18 2200  cefTRIAXone (ROCEPHIN) 2 g in sodium chloride 0.9 % 100 mL IVPB  Status:  Discontinued     2 g 200 mL/hr over 30 Minutes Intravenous Every 24 hours 02/18/18 0439 02/20/18 1355   02/19/2018 2245  cefTRIAXone (ROCEPHIN) 2 g in sodium chloride 0.9 % 100 mL IVPB     2 g 200 mL/hr over 30 Minutes Intravenous  Once 02/06/2018 2243 02/18/18 0008      Medications:  Scheduled: . aspirin EC  81 mg Oral Daily  . atorvastatin  40 mg Oral q1800  . feeding supplement (ENSURE ENLIVE)  237 mL Oral BID BM  . ipratropium-albuterol  3 mL Nebulization Q6H  . nicotine  21 mg Transdermal Daily  . pantoprazole  40 mg Oral Daily    Objective: Vital signs in last 24 hours: Temp:  [97.8 F (36.6 C)-100.2 F (37.9 C)] 99.9 F (37.7 C) (04/17 0824) Pulse Rate:  [78-107] 99 (04/17 0824) Resp:  [21-35] 21 (04/17 0824) BP: (77-132)/(31-71) 113/56 (04/17 0824) SpO2:  [94 %-100 %] 97 % (04/17 0824) Weight:  [57.8 kg (127 lb 6.8 oz)] 57.8 kg (127 lb 6.8 oz) (04/17 0458)   General appearance: alert, cooperative and no distress Resp: clear to auscultation bilaterally Cardio: regular rate and rhythm GI: normal findings: bowel sounds normal and soft,  non-tender  Lab Results Recent Labs    02/20/18 0505 02/27/2018 1530 02/22/18 0237  WBC 10.8*  --  9.2  HGB 8.8*  --  7.8*  HCT 26.4*  --  23.1*  NA 138 134* 132*  K 3.1* 3.6 3.4*  CL 107 106 100*  CO2 22 18* 22  BUN <5* 6 5*  CREATININE 1.05 1.08 1.13   Liver Panel No results for input(s): PROT, ALBUMIN, AST, ALT, ALKPHOS, BILITOT, BILIDIR, IBILI in the last 72 hours. Sedimentation Rate No results for input(s): ESRSEDRATE in the last 72 hours. C-Reactive Protein No results for input(s): CRP in the last 72 hours.  Microbiology: Recent Results (from the past 240 hour(s))  Blood culture (routine x 2)     Status: Abnormal   Collection Time: 02/15/18  6:25 PM  Result Value Ref Range Status   Specimen Description BLOOD RIGHT ANTECUBITAL  Final   Special Requests   Final    BOTTLES DRAWN AEROBIC AND ANAEROBIC Blood Culture adequate volume   Culture  Setup Time   Final    GRAM POSITIVE COCCI IN CHAINS AEROBIC BOTTLE ONLY CRITICAL RESULT CALLED TO, READ BACK BY AND VERIFIED WITH: Unk Lightning RN 15:40 02/16/18 ([email or web address])    Culture (A)  Final    VIRIDANS STREPTOCOCCUS THE SIGNIFICANCE OF ISOLATING THIS ORGANISM FROM  A SINGLE VENIPUNCTURE CANNOT BE PREDICTED WITHOUT FURTHER CLINICAL AND CULTURE CORRELATION. SUSCEPTIBILITIES AVAILABLE ONLY ON REQUEST. Performed at [facility], [place] [address], [place], [place] [address]    Report Status 02/18/2018 FINAL  Final  Blood Culture ID Panel (Reflexed)     Status: Abnormal   Collection Time: 02/15/18  6:25 PM  Result Value Ref Range Status   Enterococcus species NOT DETECTED NOT DETECTED Final   Listeria monocytogenes NOT DETECTED NOT DETECTED Final   Staphylococcus species NOT DETECTED NOT DETECTED Final   Staphylococcus aureus NOT DETECTED NOT DETECTED Final   Streptococcus species DETECTED (A) NOT DETECTED Final    Comment: Not Enterococcus species, Streptococcus agalactiae, Streptococcus pyogenes, or Streptococcus  pneumoniae. CRITICAL RESULT CALLED TO, READ BACK BY AND VERIFIED WITH: Unk Lightning RN 15:40 02/16/18 ([email or web address])    Streptococcus agalactiae NOT DETECTED NOT DETECTED Final   Streptococcus pneumoniae NOT DETECTED NOT DETECTED Final   Streptococcus pyogenes NOT DETECTED NOT DETECTED Final   Acinetobacter baumannii NOT DETECTED NOT DETECTED Final   Enterobacteriaceae species NOT DETECTED NOT DETECTED Final   Enterobacter cloacae complex NOT DETECTED NOT DETECTED Final   Escherichia coli NOT DETECTED NOT DETECTED Final   Klebsiella oxytoca NOT DETECTED NOT DETECTED Final   Klebsiella pneumoniae NOT DETECTED NOT DETECTED Final   Proteus species NOT DETECTED NOT DETECTED Final   Serratia marcescens NOT DETECTED NOT DETECTED Final   Haemophilus influenzae NOT DETECTED NOT DETECTED Final   Neisseria meningitidis NOT DETECTED NOT DETECTED Final   Pseudomonas aeruginosa NOT DETECTED NOT DETECTED Final   Candida albicans NOT DETECTED NOT DETECTED Final   Candida glabrata NOT DETECTED NOT DETECTED Final   Candida krusei NOT DETECTED NOT DETECTED Final   Candida parapsilosis NOT DETECTED NOT DETECTED Final   Candida tropicalis NOT DETECTED NOT DETECTED Final    Comment: Performed at [facility], [place] [address] [place], [place] [address]  Culture, blood (Routine x 2)     Status: Abnormal   Collection Time: 02/25/2018  9:00 PM  Result Value Ref Range Status   Specimen Description BLOOD RIGHT ANTECUBITAL  Final   Special Requests   Final    BOTTLES DRAWN AEROBIC AND ANAEROBIC Blood Culture adequate volume   Culture  Setup Time   Final    GRAM POSITIVE COCCI IN CHAINS IN BOTH AEROBIC AND ANAEROBIC BOTTLES CRITICAL RESULT CALLED TO, READ BACK BY AND VERIFIED WITH: E SINCLAIR,PHARMD AT 1258 02/18/18 BY L BENFIELD Performed at [facility], [place] [address], [place], [place] [address]    Culture VIRIDANS STREPTOCOCCUS (A)  Final   Report Status 02/20/2018 FINAL  Final   Organism ID, Bacteria  VIRIDANS STREPTOCOCCUS  Final      Susceptibility   Viridans streptococcus - MIC*    TETRACYCLINE >=16 RESISTANT Resistant     VANCOMYCIN 0.5 SENSITIVE Sensitive     CLINDAMYCIN >=1 RESISTANT Resistant     PENICILLIN 0.12 SENSITIVE Sensitive     CEFTRIAXONE <=0.12 SENSITIVE Sensitive     ERYTHROMYCIN >=8 RESISTANT Resistant     LEVOFLOXACIN 0.5 SENSITIVE Sensitive     * VIRIDANS STREPTOCOCCUS  Blood Culture ID Panel (Reflexed)     Status: Abnormal   Collection Time: 02/10/2018  9:00 PM  Result Value Ref Range Status   Enterococcus species NOT DETECTED NOT DETECTED Final   Listeria monocytogenes NOT DETECTED NOT DETECTED Final   Staphylococcus species NOT DETECTED NOT DETECTED Final   Staphylococcus aureus NOT DETECTED NOT  DETECTED Final   Streptococcus species DETECTED (A) NOT DETECTED Final    Comment: Not Enterococcus species, Streptococcus agalactiae, Streptococcus pyogenes, or Streptococcus pneumoniae. CRITICAL RESULT CALLED TO, READ BACK BY AND VERIFIED WITH: E SINCLAIR,PHARMD AT 1258 02/18/18 BY L BENFIELD    Streptococcus agalactiae NOT DETECTED NOT DETECTED Final   Streptococcus pneumoniae NOT DETECTED NOT DETECTED Final   Streptococcus pyogenes NOT DETECTED NOT DETECTED Final   Acinetobacter baumannii NOT DETECTED NOT DETECTED Final   Enterobacteriaceae species NOT DETECTED NOT DETECTED Final   Enterobacter cloacae complex NOT DETECTED NOT DETECTED Final   Escherichia coli NOT DETECTED NOT DETECTED Final   Klebsiella oxytoca NOT DETECTED NOT DETECTED Final   Klebsiella pneumoniae NOT DETECTED NOT DETECTED Final   Proteus species NOT DETECTED NOT DETECTED Final   Serratia marcescens NOT DETECTED NOT DETECTED Final   Haemophilus influenzae NOT DETECTED NOT DETECTED Final   Neisseria meningitidis NOT DETECTED NOT DETECTED Final   Pseudomonas aeruginosa NOT DETECTED NOT DETECTED Final   Candida albicans NOT DETECTED NOT DETECTED Final   Candida glabrata NOT DETECTED NOT  DETECTED Final   Candida krusei NOT DETECTED NOT DETECTED Final   Candida parapsilosis NOT DETECTED NOT DETECTED Final   Candida tropicalis NOT DETECTED NOT DETECTED Final    Comment: Performed at [facility], [place] [address], [place], [place] [address]  Culture, blood (Routine x 2)     Status: Abnormal   Collection Time: 02/10/2018 10:19 PM  Result Value Ref Range Status   Specimen Description BLOOD RIGHT FOREARM  Final   Special Requests   Final    BOTTLES DRAWN AEROBIC AND ANAEROBIC Blood Culture adequate volume   Culture  Setup Time   Final    GRAM POSITIVE COCCI IN CHAINS IN BOTH AEROBIC AND ANAEROBIC BOTTLES CRITICAL RESULT CALLED TO, READ BACK BY AND VERIFIED WITH: E. SINCLAIR PHARMD, AT 1339 02/18/18 BY D. VANHOOK    Culture (A)  Final    VIRIDANS STREPTOCOCCUS SUSCEPTIBILITIES PERFORMED ON PREVIOUS CULTURE WITHIN THE LAST 5 DAYS. Performed at [facility], [place] [address] [place], [place] [address]    Report Status 02/20/2018 FINAL  Final  MRSA PCR Screening     Status: None   Collection Time: 02/18/18  9:16 PM  Result Value Ref Range Status   MRSA by PCR NEGATIVE NEGATIVE Final    Comment:        The GeneXpert MRSA Assay (FDA approved for NASAL specimens only), is one component of a comprehensive MRSA colonization surveillance program. It is not intended to diagnose MRSA infection nor to guide or monitor treatment for MRSA infections. Performed at [facility], [place] [address], [place], [place] [address]   Culture, blood (routine x 2)     Status: None (Preliminary result)   Collection Time: 02/20/18  2:07 PM  Result Value Ref Range Status   Specimen Description BLOOD LEFT HAND  Final   Special Requests   Final    BOTTLES DRAWN AEROBIC ONLY Blood Culture adequate volume   Culture   Final    NO GROWTH 1 DAY Performed at [facility], [place] [address] [place], [place] [address]    Report Status PENDING  Incomplete  Culture, blood (routine x  2)     Status: None (Preliminary result)   Collection Time: 02/20/18  2:14 PM  Result Value Ref Range Status   Specimen Description BLOOD RIGHT HAND  Final   Special Requests  Final    BOTTLES DRAWN AEROBIC ONLY Blood Culture adequate volume   Culture   Final    NO GROWTH 1 DAY Performed at [facility], [place] [address] [place], [place] [address]    Report Status PENDING  Incomplete    Studies/Results: Dg Chest Port 1 View  Result Date: 03/06/2018 CLINICAL DATA:  Shortness of Breath EXAM: PORTABLE CHEST 1 VIEW COMPARISON:  Chest radiograph and chest CT February 17, 2018 FINDINGS: Lungs are mildly hyperexpanded. There is underlying centrilobular emphysematous change, better appreciated on CT. There is currently interstitial edema superimposed on emphysematous change. There is a small focus of airspace opacity in the lateral right base. Heart is enlarged with pulmonary venous hypertension. No adenopathy. No bone lesions. IMPRESSION: The appearance is consistent with a degree of interstitial edema superimposed on underlying emphysema. Suspect a degree of congestive heart failure. Small focus of airspace opacity in the lateral right base may represent early pneumonia superimposed. A small focus of alveolar edema in this area is felt to be less likely. Electronically Signed   By: Lowella Grip III M.D.   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  Repeat BCx 4-15 are ngtd Informed him and wife of his Hep C status and further testing Re-iterated to pt that valve surgery would not be appropriate if he continues to use drugs.           Bobby Rumpf MD, FACP Infectious Diseases (pager) [phone] [email or web address] 02/22/2018, 9:13 AM  LOS: 4 days

## 2018-02-22 NOTE — Progress Notes (Addendum)
PULMONARY / CRITICAL CARE MEDICINE   Name: Kendry Pfarr MRN: [id number] DOB: 24-May-1966    ADMISSION DATE:  02/10/2018 CONSULTATION DATE: 4/16  REFERRING MD:  RAI  CHIEF COMPLAINT:  Dyspnea  HISTORY OF PRESENT ILLNESS:  52 year old with a history of IV drug abuse who presented on 4/2 complaining of months of fever and new onset dyspnea.  He was found to be bacteremic with strep viridans transthoracic echocardiogram showed normal systolic LV function with severe AI and a large aortic valve vegetation.  He was taken for TEE today at which time he received sedation with propofol.  He apparently was relatively hypotensive and received a total of 2 L of normal saline for the procedure.  Following the procedure he is complaining of increased dyspnea.  He did not have any nausea or vomiting associated with the procedure.  He is a lifelong heavy smoker.  Not currently having any cough.  SUBJECTIVE:   Pt denies SOB.  Reports significant UOP in last 24 hours.  No fevers / chills.  Asking why he is still on heparin.    VITAL SIGNS: BP (!) 113/56 (BP Location: Left Arm)   Pulse 99   Temp 99.9 F (37.7 C) (Oral)   Resp (!) 21   Ht 6' (1.829 m)   Wt 127 lb 6.8 oz (57.8 kg)   SpO2 97%   BMI 17.28 kg/m   HEMODYNAMICS:    VENTILATOR SETTINGS:    INTAKE / OUTPUT: I/O last 3 completed shifts: In: 3843.1 [P.O.:120; I.V.:1973.1; IV Piggyback:1750] Out: 3810 [Urine:3810]  PHYSICAL EXAMINATION: General: ill appearing male in NAD HEENT: MM pink/moist, no jvd PSY: calm Neuro: AAOx4, speech clear, MAE, L AKA amputation (remote) CV: s1s2 rrr, no m/r/g PULM: even/non-labored, lungs bilaterally clear, diminished bases, no wheezing or crackles  GB:TDVV, non-tender, bsx4 active  Extremities: warm/dry, no edema  Skin: no rashes or lesions, multiple tattoos   LABS:  BMET Recent Labs  Lab 02/20/18 0505 02/09/2018 1530 02/22/18 0237  NA 138 134* 132*  K 3.1* 3.6 3.4*  CL 107 106 100*  CO2 22  18* 22  BUN <5* 6 5*  CREATININE 1.05 1.08 1.13  GLUCOSE 107* 133* 129*    Electrolytes Recent Labs  Lab 02/20/18 0505 02/22/2018 1530 02/22/18 0237  CALCIUM 9.6 8.8* 8.5*  MG  --  1.4*  --     CBC Recent Labs  Lab 02/19/18 0439 02/20/18 0505 02/22/18 0237  WBC 8.7 10.8* 9.2  HGB 8.7* 8.8* 7.8*  HCT 25.7* 26.4* 23.1*  PLT 186 222 200    Coag's Recent Labs  Lab 02/28/2018 2100  INR 1.13    Sepsis Markers Recent Labs  Lab 02/18/18 0252 02/18/18 0942 02/19/18 0439  LATICACIDVEN 2.57* 2.26* 1.5    ABG No results for input(s): PHART, PCO2ART, PO2ART in the last 168 hours.  Liver Enzymes Recent Labs  Lab 02/16/18 2147 02/12/2018 2220 02/19/18 0439  AST 33 31 18  ALT 11* 12* 8*  ALKPHOS 78 85 62  BILITOT 0.7 0.7 0.5  ALBUMIN 2.3* 2.3* 1.9*    Cardiac Enzymes Recent Labs  Lab 02/18/18 0935 02/18/18 2105 02/19/18 0439  TROPONINI 0.18* 0.16* 0.25*    Glucose No results for input(s): GLUCAP in the last 168 hours.  Imaging Dg Chest Port 1 View  Result Date: 02/25/2018 CLINICAL DATA:  Shortness of Breath EXAM: PORTABLE CHEST 1 VIEW COMPARISON:  Chest radiograph and chest CT February 17, 2018 FINDINGS: Lungs are mildly hyperexpanded. There is underlying centrilobular  emphysematous change, better appreciated on CT. There is currently interstitial edema superimposed on emphysematous change. There is a small focus of airspace opacity in the lateral right base. Heart is enlarged with pulmonary venous hypertension. No adenopathy. No bone lesions. IMPRESSION: The appearance is consistent with a degree of interstitial edema superimposed on underlying emphysema. Suspect a degree of congestive heart failure. Small focus of airspace opacity in the lateral right base may represent early pneumonia superimposed. A small focus of alveolar edema in this area is felt to be less likely. Electronically Signed   By: Lowella Grip III M.D.   On: 03/01/2018 15:22    CULTURES:   ANTIBIOTICS: Penicillin G  SIGNIFICANT EVENTS:    DISCUSSION: 52 year old M, smoker, with a history of IV drug abuse, likely has chronic lung disease at baseline based on bullous disease on admission CT scan of his chest significant smoking history. 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  PCCM asked to see the patient for dyspnea following a TEE earlier today.  ASSESSMENT / PLAN:  PULMONARY A:  Dyspnea - suspect secondary to volume overload as he required 2L fluid during TEE for hypotension.   P: Diuresis as renal function / BP permit  Defer above to Cardiology  Symptoms resolved, pt on room air currently  CXR consistent with CHF superimposed on emphysema   CARDIOVASCULAR A:  Acute Aortic Insufficiency secondary to strep viridans endocarditis.   Elevated Troponin - flat trend P: Per Cardiology ?  If heparin can be discontinued, defer to Cardiology   RENAL A:  Trend BMP / urinary output Replace electrolytes as indicated Avoid nephrotoxic agents, ensure adequate renal perfusion   INFECTIOUS A:  Strep Viridans Endocarditis  IVDA  P: Penicillin G per ID   NEUROLOGIC A:  No acute issues - despite extensive vegetation on AV P: Monitor   PCCM will sign off.  Please call back if new needs arise.   Noe Gens, NP-C [facility] Pgr: [phone] or if no answer [phone] 02/22/2018, 10:17 AM  Attending Note:  52 year old male with HIV history presenting to PCCM with SOB.  On exam, lungs are clear.  I reviewed CXR myself, pulmonary edema noted.  Discussed with PCCM-NP  SOB: likely due to pulmonary edema and emphysema  COPD:  - BD as ordered  - F/U CXR only as noted at this point  Pulmonary edema:  - Diureses as ordered  - Cardiology following  Hypoxemia:  - Titrate O2 to off at this point  - Monitor  Endocarditis:  - Abx as ordered  - ID following  PCCM will sign off, please  call back if needed.  Patient seen and examined, agree with above note.  I dictated the care and orders written for this patient under my direction.  Rush Farmer, [place]

## 2018-02-23 ENCOUNTER — Inpatient Hospital Stay (HOSPITAL_COMMUNITY): Payer: Medicare HMO

## 2018-02-23 DIAGNOSIS — I609 Nontraumatic subarachnoid hemorrhage, unspecified: Secondary | ICD-10-CM

## 2018-02-23 DIAGNOSIS — G9340 Encephalopathy, unspecified: Secondary | ICD-10-CM

## 2018-02-23 DIAGNOSIS — L8990 Pressure ulcer of unspecified site, unspecified stage: Secondary | ICD-10-CM

## 2018-02-23 DIAGNOSIS — J9600 Acute respiratory failure, unspecified whether with hypoxia or hypercapnia: Secondary | ICD-10-CM

## 2018-02-23 DIAGNOSIS — J9601 Acute respiratory failure with hypoxia: Secondary | ICD-10-CM

## 2018-02-23 LAB — TYPE AND SCREEN
ABO/RH(D): A POS
Antibody Screen: NEGATIVE
Unit division: 0

## 2018-02-23 LAB — COMPREHENSIVE METABOLIC PANEL
ALT: 8 U/L — AB (ref 17–63)
AST: 34 U/L (ref 15–41)
Albumin: 2 g/dL — ABNORMAL LOW (ref 3.5–5.0)
Alkaline Phosphatase: 65 U/L (ref 38–126)
Anion gap: 15 (ref 5–15)
BILIRUBIN TOTAL: 1 mg/dL (ref 0.3–1.2)
BUN: 8 mg/dL (ref 6–20)
CO2: 19 mmol/L — ABNORMAL LOW (ref 22–32)
CREATININE: 1.28 mg/dL — AB (ref 0.61–1.24)
Calcium: 8.5 mg/dL — ABNORMAL LOW (ref 8.9–10.3)
Chloride: 100 mmol/L — ABNORMAL LOW (ref 101–111)
Glucose, Bld: 224 mg/dL — ABNORMAL HIGH (ref 65–99)
POTASSIUM: 4.2 mmol/L (ref 3.5–5.1)
Sodium: 134 mmol/L — ABNORMAL LOW (ref 135–145)
TOTAL PROTEIN: 6.7 g/dL (ref 6.5–8.1)

## 2018-02-23 LAB — PROTIME-INR
INR: 1.28
Prothrombin Time: 15.9 seconds — ABNORMAL HIGH (ref 11.4–15.2)

## 2018-02-23 LAB — GLUCOSE, CAPILLARY
GLUCOSE-CAPILLARY: 128 mg/dL — AB (ref 65–99)
Glucose-Capillary: 311 mg/dL — ABNORMAL HIGH (ref 65–99)

## 2018-02-23 LAB — POCT I-STAT 3, ART BLOOD GAS (G3+)
ACID-BASE DEFICIT: 8 mmol/L — AB (ref 0.0–2.0)
ACID-BASE DEFICIT: 3 mmol/L — AB (ref 0.0–2.0)
BICARBONATE: 18.1 mmol/L — AB (ref 20.0–28.0)
BICARBONATE: 20.4 mmol/L (ref 20.0–28.0)
O2 Saturation: 100 %
O2 Saturation: 99 %
TCO2: 19 mmol/L — AB (ref 22–32)
TCO2: 21 mmol/L — ABNORMAL LOW (ref 22–32)
pCO2 arterial: 40.4 mmHg (ref 32.0–48.0)
pCO2 arterial: 29 mmHg — ABNORMAL LOW (ref 32.0–48.0)
pH, Arterial: 7.261 — ABNORMAL LOW (ref 7.350–7.450)
pH, Arterial: 7.455 — ABNORMAL HIGH (ref 7.350–7.450)
pO2, Arterial: 412 mmHg — ABNORMAL HIGH (ref 83.0–108.0)
pO2, Arterial: 129 mmHg — ABNORMAL HIGH (ref 83.0–108.0)

## 2018-02-23 LAB — MAGNESIUM: Magnesium: 1.6 mg/dL — ABNORMAL LOW (ref 1.7–2.4)

## 2018-02-23 LAB — BPAM RBC
Blood Product Expiration Date: 201904202359
ISSUE DATE / TIME: 201904171547
UNIT TYPE AND RH: 600

## 2018-02-23 LAB — CBC
HCT: 30.8 % — ABNORMAL LOW (ref 39.0–52.0)
HEMOGLOBIN: 10.2 g/dL — AB (ref 13.0–17.0)
MCH: 30.2 pg (ref 26.0–34.0)
MCHC: 33.1 g/dL (ref 30.0–36.0)
MCV: 91.1 fL (ref 78.0–100.0)
PLATELETS: 274 10*3/uL (ref 150–400)
RBC: 3.38 MIL/uL — ABNORMAL LOW (ref 4.22–5.81)
RDW: 17 % — ABNORMAL HIGH (ref 11.5–15.5)
WBC: 29.6 10*3/uL — ABNORMAL HIGH (ref 4.0–10.5)

## 2018-02-23 LAB — PHOSPHORUS: Phosphorus: 4.1 mg/dL (ref 2.5–4.6)

## 2018-02-23 LAB — TROPONIN I: Troponin I: 0.59 ng/mL (ref <0.03)

## 2018-02-23 LAB — PROCALCITONIN: Procalcitonin: 0.29 ng/mL

## 2018-02-23 LAB — BRAIN NATRIURETIC PEPTIDE: B Natriuretic Peptide: 910.1 pg/mL — ABNORMAL HIGH (ref 0.0–100.0)

## 2018-02-23 LAB — LACTIC ACID, PLASMA: LACTIC ACID, VENOUS: 4.5 mmol/L — AB (ref 0.5–1.9)

## 2018-02-23 LAB — CORTISOL: Cortisol, Plasma: 22.7 ug/dL

## 2018-02-23 LAB — HEPARIN LEVEL (UNFRACTIONATED)

## 2018-02-23 LAB — APTT: aPTT: 33 seconds (ref 24–36)

## 2018-02-23 MED ORDER — ACETAMINOPHEN 650 MG RE SUPP: 650.0000 mg | Freq: Four times a day (QID) | RECTAL | Status: DC | PRN

## 2018-02-23 MED ORDER — DOCUSATE SODIUM 50 MG/5ML PO LIQD: 100.0000 mg | Freq: Two times a day (BID) | ORAL | Status: DC | PRN

## 2018-02-23 MED ORDER — IOPAMIDOL (ISOVUE-370) INJECTION 76%
INTRAVENOUS | Status: AC
  Filled 2018-02-23: qty 50

## 2018-02-23 MED ORDER — MIDAZOLAM HCL 2 MG/2ML IJ SOLN: 2.0000 mg | INTRAMUSCULAR | Status: DC | PRN

## 2018-02-23 MED ORDER — FENTANYL 2500MCG IN NS 250ML (10MCG/ML) PREMIX INFUSION
25.0000 ug/h | INTRAVENOUS | Status: DC
  Administered 2018-02-23: 50 ug/h via INTRAVENOUS
  Filled 2018-02-23: qty 250

## 2018-02-23 MED ORDER — IOPAMIDOL (ISOVUE-370) INJECTION 76%
50.0000 mL | Freq: Once | INTRAVENOUS | Status: AC | PRN
  Administered 2018-02-23: 50 mL via INTRAVENOUS

## 2018-02-23 MED ORDER — PROTAMINE SULFATE 10 MG/ML IV SOLN
50.0000 mg | Freq: Once | INTRAVENOUS | Status: AC
  Administered 2018-02-23: 50 mg via INTRAVENOUS
  Filled 2018-02-23: qty 5

## 2018-02-23 MED ORDER — ORAL CARE MOUTH RINSE: 15.0000 mL | Freq: Four times a day (QID) | OROMUCOSAL | Status: DC

## 2018-02-23 MED ORDER — DOCUSATE SODIUM 50 MG/5ML PO LIQD: 100.0000 mg | Freq: Two times a day (BID) | ORAL | Status: DC

## 2018-02-23 MED ORDER — LABETALOL HCL 5 MG/ML IV SOLN: 10.0000 mg | INTRAVENOUS | Status: DC | PRN

## 2018-02-23 MED ORDER — FENTANYL BOLUS VIA INFUSION
50.0000 ug | INTRAVENOUS | Status: DC | PRN
  Filled 2018-02-23: qty 50

## 2018-02-23 MED ORDER — METHYLPREDNISOLONE SODIUM SUCC 125 MG IJ SOLR
60.0000 mg | Freq: Four times a day (QID) | INTRAMUSCULAR | Status: DC
  Filled 2018-02-23 (×2): qty 0.96

## 2018-02-23 MED ORDER — ACETAMINOPHEN 160 MG/5ML PO SOLN: 650.0000 mg | Freq: Four times a day (QID) | ORAL | Status: DC | PRN

## 2018-02-23 MED ORDER — SODIUM CHLORIDE 0.9 % IV BOLUS: 1000.0000 mL | Freq: Once | INTRAVENOUS | Status: DC

## 2018-02-23 MED ORDER — SUCCINYLCHOLINE CHLORIDE 20 MG/ML IJ SOLN
100.0000 mg | Freq: Once | INTRAMUSCULAR | Status: AC
  Administered 2018-02-23: 100 mg via INTRAVENOUS

## 2018-02-23 MED ORDER — MIDAZOLAM HCL 2 MG/2ML IJ SOLN
2.0000 mg | Freq: Once | INTRAMUSCULAR | Status: AC
  Administered 2018-02-23: 2 mg via INTRAVENOUS

## 2018-02-23 MED ORDER — INSULIN ASPART 100 UNIT/ML ~~LOC~~ SOLN
8.0000 [IU] | Freq: Once | SUBCUTANEOUS | Status: AC
  Administered 2018-02-23: 8 [IU] via INTRAVENOUS

## 2018-02-23 MED ORDER — FENTANYL CITRATE (PF) 100 MCG/2ML IJ SOLN
50.0000 ug | Freq: Once | INTRAMUSCULAR | Status: AC
  Administered 2018-02-23: 50 ug via INTRAVENOUS

## 2018-02-23 MED ORDER — SODIUM CHLORIDE 0.9 % IV SOLN: 250.0000 mL | INTRAVENOUS | Status: DC | PRN

## 2018-02-23 MED ORDER — SODIUM BICARBONATE 8.4 % IV SOLN
INTRAVENOUS | Status: AC
  Filled 2018-02-23: qty 50

## 2018-02-23 MED ORDER — SODIUM BICARBONATE 8.4 % IV SOLN
50.0000 meq | Freq: Once | INTRAVENOUS | Status: AC
  Administered 2018-02-23: 50 meq via INTRAVENOUS
  Filled 2018-02-23: qty 50

## 2018-02-23 MED ORDER — GADOBENATE DIMEGLUMINE 529 MG/ML IV SOLN
10.0000 mL | Freq: Once | INTRAVENOUS | Status: AC | PRN
  Administered 2018-02-23: 10 mL via INTRAVENOUS

## 2018-02-23 MED ORDER — INSULIN ASPART 100 UNIT/ML ~~LOC~~ SOLN: 2.0000 [IU] | SUBCUTANEOUS | Status: DC

## 2018-02-23 MED ORDER — CHLORHEXIDINE GLUCONATE 0.12% ORAL RINSE (MEDLINE KIT)
15.0000 mL | Freq: Two times a day (BID) | OROMUCOSAL | Status: DC
  Administered 2018-02-23: 15 mL via OROMUCOSAL

## 2018-02-23 MED ORDER — ETOMIDATE 2 MG/ML IV SOLN
20.0000 mg | Freq: Once | INTRAVENOUS | Status: AC
  Administered 2018-02-23: 20 mg via INTRAVENOUS

## 2018-02-23 MED FILL — Medication: Qty: 1 | Status: AC

## 2018-02-24 ENCOUNTER — Encounter (HOSPITAL_COMMUNITY): Payer: Self-pay

## 2018-02-24 LAB — HIV-1 RNA QUANT-NO REFLEX-BLD
HIV 1 RNA Quant: <20 copies/mL
LOG10 HIV-1 RNA: UNDETERMINED {Log_copies}/mL

## 2018-02-24 LAB — HEPATITIS C VRS RNA DETECT BY PCR-QUAL: HEPATITIS C VRS RNA BY PCR-QUAL: NEGATIVE

## 2018-02-25 LAB — CULTURE, RESPIRATORY

## 2018-02-25 LAB — CULTURE, BLOOD (ROUTINE X 2)
Culture: NO GROWTH
Culture: NO GROWTH
SPECIAL REQUESTS: ADEQUATE
Special Requests: ADEQUATE

## 2018-02-26 LAB — HEPATITIS C GENOTYPE

## 2018-02-27 ENCOUNTER — Telehealth: Payer: Self-pay

## 2018-02-27 NOTE — Telephone Encounter (Signed)
On 02/27/18 I received a d/c from [facility] (original). The d/c is for cremation. The patient is a patient of Doctor Byrum. The d/c will be taken to [facility] for signature.  On 02/28/18 I received the d/c back from Doctor Byrum. I got the d/c ready and called the funeral home to let them know the d/c is ready for pickup. I also faxed a copy to the funeral home per the funeral home request.

## 2018-02-28 LAB — CULTURE, BLOOD (ROUTINE X 2)
CULTURE: NO GROWTH
CULTURE: NO GROWTH
SPECIAL REQUESTS: ADEQUATE

## 2018-03-08 NOTE — Consult Note (Addendum)
[address]  Basic metabolic panel     Status: Abnormal   Collection Time: 02/19/2018  3:30 PM  Result Value Ref Range   Sodium 134 (L) 135 - 145 mmol/L   Potassium 3.6 3.5 - 5.1 mmol/L   Chloride 106 101 - 111 mmol/L   CO2 18 (L) 22 - 32 mmol/L   Glucose, Bld 133 (H) 65 -  99 mg/dL   BUN 6 6 - 20 mg/dL   Creatinine, Ser 1.08 0.61 - 1.24 mg/dL   Calcium 8.8 (L) 8.9 - 10.3 mg/dL   GFR calc non Af Amer >60 >60 mL/min   GFR calc Af Amer >60 >60 mL/min    Comment: (NOTE) The eGFR has been calculated using the CKD EPI equation. This calculation has not been validated in all clinical situations. eGFR's persistently <60 mL/min signify possible Chronic Kidney Disease.    Anion gap 10 5 - 15    Comment: Performed at [facility] [address], [place], [place] [address]  Magnesium     Status: Abnormal   Collection Time: 03/01/2018  3:30 PM  Result Value Ref Range   Magnesium 1.4 (L) 1.7 - 2.4 mg/dL    Comment: Performed at [facility] [address] [place], [place] [address]  Heparin level (unfractionated)     Status: None   Collection Time: 02/22/18  2:37 AM  Result Value Ref Range   Heparin Unfractionated 0.42 0.30 - 0.70 IU/mL    Comment:        IF HEPARIN RESULTS ARE BELOW EXPECTED VALUES, AND PATIENT DOSAGE HAS BEEN CONFIRMED, SUGGEST FOLLOW UP TESTING OF ANTITHROMBIN III LEVELS. Performed at [facility], [place] [address], [place], [place] [address]   CBC with Differential/Platelet     Status: Abnormal   Collection Time: 02/22/18  2:37 AM  Result Value Ref Range   WBC 9.2 4.0 - 10.5 K/uL   RBC 2.63 (L) 4.22 - 5.81 MIL/uL   Hemoglobin 7.8 (L) 13.0 - 17.0 g/dL   HCT 23.1 (L) 39.0 - 52.0 %   MCV 87.8 78.0 - 100.0 fL   MCH 29.7 26.0 - 34.0 pg   MCHC 33.8 30.0 - 36.0 g/dL   RDW 16.5 (H) 11.5 - 15.5 %   Platelets 200 150 - 400 K/uL   Neutrophils Relative % 76 %   Neutro Abs 7.0 1.7 - 7.7 K/uL   Lymphocytes Relative 17 %   Lymphs Abs 1.6 0.7 - 4.0 K/uL   Monocytes Relative 5 %   Monocytes Absolute 0.4 0.1 - 1.0 K/uL   Eosinophils Relative 2 %   Eosinophils Absolute 0.2 0.0 - 0.7 K/uL   Basophils Relative 0 %   Basophils Absolute 0.0 0.0 - 0.1 K/uL    Comment: Performed at [facility] [address]  [place], [place] [address]  Basic metabolic panel     Status: Abnormal   Collection Time: 02/22/18  2:37 AM  Result Value Ref Range   Sodium 132 (L) 135 - 145 mmol/L   Potassium 3.4 (L) 3.5 - 5.1 mmol/L   Chloride 100 (L) 101 - 111 mmol/L   CO2 22 22 - 32 mmol/L   Glucose, Bld 129 (H) 65 - 99 mg/dL   BUN 5 (L) 6 - 20 mg/dL   Creatinine, Ser 1.13 0.61 - 1.24 mg/dL   Calcium 8.5 (L) 8.9 - 10.3 mg/dL   GFR calc non Af Amer >60 >60 mL/min   GFR  Septic pulmonary embolism ([place]) 2013  . Squamous cell carcinoma of scalp   . Stroke [facility]) ~ 2005-2006 X 2   denies residual on 11/08/2016    PSH: Past Surgical History:  Procedure Laterality Date  . ABOVE KNEE LEG AMPUTATION Left   . CARDIAC CATHETERIZATION  "several"  . CARDIAC CATHETERIZATION N/A 11/26/2015   Procedure: Left Heart Cath and Coronary Angiography;  Surgeon: Burnell Blanks, MD;  Location:  [facility];  Service: Cardiovascular;  Laterality: N/A;  . CLUB FOOT RELEASE Left 1967/05/26  . CORONARY ANGIOPLASTY  "several"  . CORONARY ANGIOPLASTY WITH STENT PLACEMENT  "several"   "total of 10 stents placed" (11/18/2015)  . LAPAROSCOPIC CHOLECYSTECTOMY    . LEFT HEART CATH AND CORONARY ANGIOGRAPHY N/A 12/20/2016   Procedure: Left Heart Cath and Coronary Angiography;  Surgeon: Troy Sine, MD;  Location: [facility];  Service: Cardiovascular;  Laterality: N/A;  . ORTHOPEDIC SURGERY  11/10/66-~ 2007   "total of 52 on my left leg; started w/club foot released"  . TEE WITHOUT CARDIOVERSION N/A 02/06/2018   Procedure: TRANSESOPHAGEAL ECHOCARDIOGRAM (TEE);  Surgeon: Jerline Pain, MD;  Location: [facility] ENDOSCOPY;  Service: Cardiovascular;  Laterality: N/A;  . TONSILLECTOMY      Medications Prior to Admission  Medication Sig Dispense Refill Last Dose  . acetaminophen (TYLENOL) 325 MG tablet Take 2 tablets (650 mg total) by mouth every 4 (four) hours as needed for headache or mild pain.   unk  . aspirin-acetaminophen-caffeine (EXCEDRIN MIGRAINE) 250-250-65 MG tablet Take 2 tablets by mouth every 6 (six) hours as needed for headache.   Past Week at Unknown time  . cyclobenzaprine (FLEXERIL) 10 MG tablet Take 10 mg by mouth 3 (three) times daily as needed for muscle spasms.   0 unk  . ibuprofen (ADVIL,MOTRIN) 200 MG tablet Take 1,400 mg by mouth daily as needed for moderate pain.   Past Week at Unknown time  . loperamide (IMODIUM A-D) 2 MG tablet Take 2-4 mg by mouth 4 (four) times daily as needed for diarrhea or loose stools.   unk  . nitroGLYCERIN (NITROSTAT) 0.4 MG SL tablet Place 1 tablet (0.4 mg total) under the tongue every 5 (five) minutes x 3 doses as needed for chest pain. 25 tablet 6 unk    SH: Social History   Tobacco Use  . Smoking status: Current Every Day Smoker    Packs/day: 0.50    Years: 35.00    Pack years: 17.50    Types: Cigarettes  . Smokeless tobacco:  Never Used  Substance Use Topics  . Alcohol use: Not Currently  . 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 Authorizing Provider  acetaminophen (TYLENOL) 325 MG tablet Take 2 tablets (650 mg total) by mouth every 4 (four) hours as needed for headache or mild pain. 12/20/16  Yes Isaiah Serge, NP  aspirin-acetaminophen-caffeine (EXCEDRIN MIGRAINE) [phone] MG tablet Take 2 tablets by mouth every 6 (six) hours as needed for headache.   Yes [provider]  cyclobenzaprine (FLEXERIL) 10 MG tablet Take 10 mg by mouth 3 (three) times daily as needed for muscle spasms.  03/10/17  Yes [provider]  ibuprofen (ADVIL,MOTRIN) 200 MG tablet Take 1,400 mg by mouth daily as needed for moderate pain.   Yes [provider]  loperamide (IMODIUM A-D) 2 MG  within normal limits. Mild scattered mucosal thickening within the ethmoidal air cells. Paranasal sinuses are otherwise clear. No significant mastoid effusion. Inner ear structures normal. Other: None. IMPRESSION: 1. Abnormal enhancement involving the basilar meninges, most evident at the left perimesencephalic cistern with extension around the left anteromedial aspect of the right temporal lobe. Finding highly suspicious for infectious meningitis given the provided history. Correlation with CSF studies recommended. 2. 2.5 cm curvilinear infarct involving the right globus pallidus/posterior right internal capsule, favored to be secondary to the acute process involving the basilar meninges. While a septic embolus could also be considered, this would perhaps be less favored given the relative lack of associated enhancement or hemorrhage or additional infarcts within the brain. Electronically Signed   By: Jeannine Boga M.D.   On: 2018/03/03 04:26   Dg Chest Port 1 View  Result Date: 02/09/2018 CLINICAL DATA:  Shortness of Breath EXAM: PORTABLE CHEST 1 VIEW COMPARISON:  Chest radiograph and chest CT February 17, 2018 FINDINGS: Lungs are mildly hyperexpanded. There is underlying centrilobular emphysematous change, better appreciated on CT. There is currently interstitial edema superimposed on emphysematous change. There is a small focus of airspace opacity in the lateral right base. Heart is enlarged with pulmonary venous hypertension. No adenopathy. No bone lesions. IMPRESSION: The appearance is consistent with a degree of interstitial edema superimposed on underlying emphysema. Suspect a degree of congestive heart failure. Small focus of airspace opacity in the lateral right base may represent early  pneumonia superimposed. A small focus of alveolar edema in this area is felt to be less likely. Electronically Signed   By: Lowella Grip III M.D.   On: 03/07/2018 15:22   Ct Head Code Stroke Wo Contrast`  Result Date: 03-Mar-2018 CLINICAL DATA:  Code stroke. Initial evaluation for new onset altered mental status, status post fall out of bed, on heparin drip. EXAM: CT HEAD WITHOUT CONTRAST TECHNIQUE: Contiguous axial images were obtained from the base of the skull through the vertex without intravenous contrast. COMPARISON:  Prior MRI from earlier the same day. FINDINGS: Brain: Extensive acute subarachnoid hemorrhage now seen throughout the basilar cisterns, extending into the sylvian fissures bilaterally, left greater than right. Subarachnoid blood extends superiorly into the frontal lobes bilaterally, also worse on the left. Subarachnoid overlies the anterior temporal horns bilaterally. This appears new relative to previous MRI. Previously identified acute ischemic infarct involving the right globus pallidus again seen, grossly stable from previous. No other discernible large vessel territory infarct. No mass lesion or midline shift. No hydrocephalus. No extra-axial fluid collection. Vascular: Intracranial vasculature not well evaluated due to the presence of the subarachnoid blood. Skull: Scalp soft tissues demonstrate no acute abnormality. Calvarium intact. Sinuses/Orbits: Globes and orbital soft tissues within normal limits. Paranasal sinuses and mastoid air cells are clear. Other: None. IMPRESSION: 1. Interval development of widespread subarachnoid hemorrhage throughout the basilar cisterns in left greater than right sylvian fissure. 2. Grossly stable evolving acute ischemic right basal ganglia infarct. Critical Value/emergent results were discussed by telephone at the time of interpretation on 03/03/2018 at 5:58 am to Dr. Leonel Ramsay , who verbally acknowledged these results. Electronically Signed   By:  Jeannine Boga M.D.   On: 03-Mar-2018 06:01    Impression/Plan   52 y.o. male who is currently admitted for endocarditis with severe aortic regurg and large vegetation, also on heparin who was found down earlier this am. LKN ~4am. MRI from earlier this am around 130am showed right infarct & has findings concerning for meningitis.  [address]  Basic metabolic panel     Status: Abnormal   Collection Time: 02/19/2018  3:30 PM  Result Value Ref Range   Sodium 134 (L) 135 - 145 mmol/L   Potassium 3.6 3.5 - 5.1 mmol/L   Chloride 106 101 - 111 mmol/L   CO2 18 (L) 22 - 32 mmol/L   Glucose, Bld 133 (H) 65 -  99 mg/dL   BUN 6 6 - 20 mg/dL   Creatinine, Ser 1.08 0.61 - 1.24 mg/dL   Calcium 8.8 (L) 8.9 - 10.3 mg/dL   GFR calc non Af Amer >60 >60 mL/min   GFR calc Af Amer >60 >60 mL/min    Comment: (NOTE) The eGFR has been calculated using the CKD EPI equation. This calculation has not been validated in all clinical situations. eGFR's persistently <60 mL/min signify possible Chronic Kidney Disease.    Anion gap 10 5 - 15    Comment: Performed at [facility] [address], [place], [place] [address]  Magnesium     Status: Abnormal   Collection Time: 03/01/2018  3:30 PM  Result Value Ref Range   Magnesium 1.4 (L) 1.7 - 2.4 mg/dL    Comment: Performed at [facility] [address] [place], [place] [address]  Heparin level (unfractionated)     Status: None   Collection Time: 02/22/18  2:37 AM  Result Value Ref Range   Heparin Unfractionated 0.42 0.30 - 0.70 IU/mL    Comment:        IF HEPARIN RESULTS ARE BELOW EXPECTED VALUES, AND PATIENT DOSAGE HAS BEEN CONFIRMED, SUGGEST FOLLOW UP TESTING OF ANTITHROMBIN III LEVELS. Performed at [facility], [place] [address], [place], [place] [address]   CBC with Differential/Platelet     Status: Abnormal   Collection Time: 02/22/18  2:37 AM  Result Value Ref Range   WBC 9.2 4.0 - 10.5 K/uL   RBC 2.63 (L) 4.22 - 5.81 MIL/uL   Hemoglobin 7.8 (L) 13.0 - 17.0 g/dL   HCT 23.1 (L) 39.0 - 52.0 %   MCV 87.8 78.0 - 100.0 fL   MCH 29.7 26.0 - 34.0 pg   MCHC 33.8 30.0 - 36.0 g/dL   RDW 16.5 (H) 11.5 - 15.5 %   Platelets 200 150 - 400 K/uL   Neutrophils Relative % 76 %   Neutro Abs 7.0 1.7 - 7.7 K/uL   Lymphocytes Relative 17 %   Lymphs Abs 1.6 0.7 - 4.0 K/uL   Monocytes Relative 5 %   Monocytes Absolute 0.4 0.1 - 1.0 K/uL   Eosinophils Relative 2 %   Eosinophils Absolute 0.2 0.0 - 0.7 K/uL   Basophils Relative 0 %   Basophils Absolute 0.0 0.0 - 0.1 K/uL    Comment: Performed at [facility] [address]  [place], [place] [address]  Basic metabolic panel     Status: Abnormal   Collection Time: 02/22/18  2:37 AM  Result Value Ref Range   Sodium 132 (L) 135 - 145 mmol/L   Potassium 3.4 (L) 3.5 - 5.1 mmol/L   Chloride 100 (L) 101 - 111 mmol/L   CO2 22 22 - 32 mmol/L   Glucose, Bld 129 (H) 65 - 99 mg/dL   BUN 5 (L) 6 - 20 mg/dL   Creatinine, Ser 1.13 0.61 - 1.24 mg/dL   Calcium 8.5 (L) 8.9 - 10.3 mg/dL   GFR calc non Af Amer >60 >60 mL/min   GFR  Septic pulmonary embolism ([place]) 2013  . Squamous cell carcinoma of scalp   . Stroke [facility]) ~ 2005-2006 X 2   denies residual on 11/08/2016    PSH: Past Surgical History:  Procedure Laterality Date  . ABOVE KNEE LEG AMPUTATION Left   . CARDIAC CATHETERIZATION  "several"  . CARDIAC CATHETERIZATION N/A 11/26/2015   Procedure: Left Heart Cath and Coronary Angiography;  Surgeon: Burnell Blanks, MD;  Location:  [facility];  Service: Cardiovascular;  Laterality: N/A;  . CLUB FOOT RELEASE Left 1967/05/26  . CORONARY ANGIOPLASTY  "several"  . CORONARY ANGIOPLASTY WITH STENT PLACEMENT  "several"   "total of 10 stents placed" (11/18/2015)  . LAPAROSCOPIC CHOLECYSTECTOMY    . LEFT HEART CATH AND CORONARY ANGIOGRAPHY N/A 12/20/2016   Procedure: Left Heart Cath and Coronary Angiography;  Surgeon: Troy Sine, MD;  Location: [facility];  Service: Cardiovascular;  Laterality: N/A;  . ORTHOPEDIC SURGERY  11/10/66-~ 2007   "total of 52 on my left leg; started w/club foot released"  . TEE WITHOUT CARDIOVERSION N/A 02/06/2018   Procedure: TRANSESOPHAGEAL ECHOCARDIOGRAM (TEE);  Surgeon: Jerline Pain, MD;  Location: [facility] ENDOSCOPY;  Service: Cardiovascular;  Laterality: N/A;  . TONSILLECTOMY      Medications Prior to Admission  Medication Sig Dispense Refill Last Dose  . acetaminophen (TYLENOL) 325 MG tablet Take 2 tablets (650 mg total) by mouth every 4 (four) hours as needed for headache or mild pain.   unk  . aspirin-acetaminophen-caffeine (EXCEDRIN MIGRAINE) 250-250-65 MG tablet Take 2 tablets by mouth every 6 (six) hours as needed for headache.   Past Week at Unknown time  . cyclobenzaprine (FLEXERIL) 10 MG tablet Take 10 mg by mouth 3 (three) times daily as needed for muscle spasms.   0 unk  . ibuprofen (ADVIL,MOTRIN) 200 MG tablet Take 1,400 mg by mouth daily as needed for moderate pain.   Past Week at Unknown time  . loperamide (IMODIUM A-D) 2 MG tablet Take 2-4 mg by mouth 4 (four) times daily as needed for diarrhea or loose stools.   unk  . nitroGLYCERIN (NITROSTAT) 0.4 MG SL tablet Place 1 tablet (0.4 mg total) under the tongue every 5 (five) minutes x 3 doses as needed for chest pain. 25 tablet 6 unk    SH: Social History   Tobacco Use  . Smoking status: Current Every Day Smoker    Packs/day: 0.50    Years: 35.00    Pack years: 17.50    Types: Cigarettes  . Smokeless tobacco:  Never Used  Substance Use Topics  . Alcohol use: Not Currently  . 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 Authorizing Provider  acetaminophen (TYLENOL) 325 MG tablet Take 2 tablets (650 mg total) by mouth every 4 (four) hours as needed for headache or mild pain. 12/20/16  Yes Isaiah Serge, NP  aspirin-acetaminophen-caffeine (EXCEDRIN MIGRAINE) [phone] MG tablet Take 2 tablets by mouth every 6 (six) hours as needed for headache.   Yes [provider]  cyclobenzaprine (FLEXERIL) 10 MG tablet Take 10 mg by mouth 3 (three) times daily as needed for muscle spasms.  03/10/17  Yes [provider]  ibuprofen (ADVIL,MOTRIN) 200 MG tablet Take 1,400 mg by mouth daily as needed for moderate pain.   Yes [provider]  loperamide (IMODIUM A-D) 2 MG  within normal limits. Mild scattered mucosal thickening within the ethmoidal air cells. Paranasal sinuses are otherwise clear. No significant mastoid effusion. Inner ear structures normal. Other: None. IMPRESSION: 1. Abnormal enhancement involving the basilar meninges, most evident at the left perimesencephalic cistern with extension around the left anteromedial aspect of the right temporal lobe. Finding highly suspicious for infectious meningitis given the provided history. Correlation with CSF studies recommended. 2. 2.5 cm curvilinear infarct involving the right globus pallidus/posterior right internal capsule, favored to be secondary to the acute process involving the basilar meninges. While a septic embolus could also be considered, this would perhaps be less favored given the relative lack of associated enhancement or hemorrhage or additional infarcts within the brain. Electronically Signed   By: Jeannine Boga M.D.   On: 2018/03/03 04:26   Dg Chest Port 1 View  Result Date: 02/09/2018 CLINICAL DATA:  Shortness of Breath EXAM: PORTABLE CHEST 1 VIEW COMPARISON:  Chest radiograph and chest CT February 17, 2018 FINDINGS: Lungs are mildly hyperexpanded. There is underlying centrilobular emphysematous change, better appreciated on CT. There is currently interstitial edema superimposed on emphysematous change. There is a small focus of airspace opacity in the lateral right base. Heart is enlarged with pulmonary venous hypertension. No adenopathy. No bone lesions. IMPRESSION: The appearance is consistent with a degree of interstitial edema superimposed on underlying emphysema. Suspect a degree of congestive heart failure. Small focus of airspace opacity in the lateral right base may represent early  pneumonia superimposed. A small focus of alveolar edema in this area is felt to be less likely. Electronically Signed   By: Lowella Grip III M.D.   On: 03/07/2018 15:22   Ct Head Code Stroke Wo Contrast`  Result Date: 03-Mar-2018 CLINICAL DATA:  Code stroke. Initial evaluation for new onset altered mental status, status post fall out of bed, on heparin drip. EXAM: CT HEAD WITHOUT CONTRAST TECHNIQUE: Contiguous axial images were obtained from the base of the skull through the vertex without intravenous contrast. COMPARISON:  Prior MRI from earlier the same day. FINDINGS: Brain: Extensive acute subarachnoid hemorrhage now seen throughout the basilar cisterns, extending into the sylvian fissures bilaterally, left greater than right. Subarachnoid blood extends superiorly into the frontal lobes bilaterally, also worse on the left. Subarachnoid overlies the anterior temporal horns bilaterally. This appears new relative to previous MRI. Previously identified acute ischemic infarct involving the right globus pallidus again seen, grossly stable from previous. No other discernible large vessel territory infarct. No mass lesion or midline shift. No hydrocephalus. No extra-axial fluid collection. Vascular: Intracranial vasculature not well evaluated due to the presence of the subarachnoid blood. Skull: Scalp soft tissues demonstrate no acute abnormality. Calvarium intact. Sinuses/Orbits: Globes and orbital soft tissues within normal limits. Paranasal sinuses and mastoid air cells are clear. Other: None. IMPRESSION: 1. Interval development of widespread subarachnoid hemorrhage throughout the basilar cisterns in left greater than right sylvian fissure. 2. Grossly stable evolving acute ischemic right basal ganglia infarct. Critical Value/emergent results were discussed by telephone at the time of interpretation on 03/03/2018 at 5:58 am to Dr. Leonel Ramsay , who verbally acknowledged these results. Electronically Signed   By:  Jeannine Boga M.D.   On: 03-Mar-2018 06:01    Impression/Plan   52 y.o. male who is currently admitted for endocarditis with severe aortic regurg and large vegetation, also on heparin who was found down earlier this am. LKN ~4am. MRI from earlier this am around 130am showed right infarct & has findings concerning for meningitis.  within normal limits. Mild scattered mucosal thickening within the ethmoidal air cells. Paranasal sinuses are otherwise clear. No significant mastoid effusion. Inner ear structures normal. Other: None. IMPRESSION: 1. Abnormal enhancement involving the basilar meninges, most evident at the left perimesencephalic cistern with extension around the left anteromedial aspect of the right temporal lobe. Finding highly suspicious for infectious meningitis given the provided history. Correlation with CSF studies recommended. 2. 2.5 cm curvilinear infarct involving the right globus pallidus/posterior right internal capsule, favored to be secondary to the acute process involving the basilar meninges. While a septic embolus could also be considered, this would perhaps be less favored given the relative lack of associated enhancement or hemorrhage or additional infarcts within the brain. Electronically Signed   By: Jeannine Boga M.D.   On: 2018/03/03 04:26   Dg Chest Port 1 View  Result Date: 02/09/2018 CLINICAL DATA:  Shortness of Breath EXAM: PORTABLE CHEST 1 VIEW COMPARISON:  Chest radiograph and chest CT February 17, 2018 FINDINGS: Lungs are mildly hyperexpanded. There is underlying centrilobular emphysematous change, better appreciated on CT. There is currently interstitial edema superimposed on emphysematous change. There is a small focus of airspace opacity in the lateral right base. Heart is enlarged with pulmonary venous hypertension. No adenopathy. No bone lesions. IMPRESSION: The appearance is consistent with a degree of interstitial edema superimposed on underlying emphysema. Suspect a degree of congestive heart failure. Small focus of airspace opacity in the lateral right base may represent early  pneumonia superimposed. A small focus of alveolar edema in this area is felt to be less likely. Electronically Signed   By: Lowella Grip III M.D.   On: 03/07/2018 15:22   Ct Head Code Stroke Wo Contrast`  Result Date: 03-Mar-2018 CLINICAL DATA:  Code stroke. Initial evaluation for new onset altered mental status, status post fall out of bed, on heparin drip. EXAM: CT HEAD WITHOUT CONTRAST TECHNIQUE: Contiguous axial images were obtained from the base of the skull through the vertex without intravenous contrast. COMPARISON:  Prior MRI from earlier the same day. FINDINGS: Brain: Extensive acute subarachnoid hemorrhage now seen throughout the basilar cisterns, extending into the sylvian fissures bilaterally, left greater than right. Subarachnoid blood extends superiorly into the frontal lobes bilaterally, also worse on the left. Subarachnoid overlies the anterior temporal horns bilaterally. This appears new relative to previous MRI. Previously identified acute ischemic infarct involving the right globus pallidus again seen, grossly stable from previous. No other discernible large vessel territory infarct. No mass lesion or midline shift. No hydrocephalus. No extra-axial fluid collection. Vascular: Intracranial vasculature not well evaluated due to the presence of the subarachnoid blood. Skull: Scalp soft tissues demonstrate no acute abnormality. Calvarium intact. Sinuses/Orbits: Globes and orbital soft tissues within normal limits. Paranasal sinuses and mastoid air cells are clear. Other: None. IMPRESSION: 1. Interval development of widespread subarachnoid hemorrhage throughout the basilar cisterns in left greater than right sylvian fissure. 2. Grossly stable evolving acute ischemic right basal ganglia infarct. Critical Value/emergent results were discussed by telephone at the time of interpretation on 03/03/2018 at 5:58 am to Dr. Leonel Ramsay , who verbally acknowledged these results. Electronically Signed   By:  Jeannine Boga M.D.   On: 03-Mar-2018 06:01    Impression/Plan   52 y.o. male who is currently admitted for endocarditis with severe aortic regurg and large vegetation, also on heparin who was found down earlier this am. LKN ~4am. MRI from earlier this am around 130am showed right infarct & has findings concerning for meningitis.

## 2018-03-08 NOTE — Progress Notes (Signed)
TCTS BRIEF PROGRESS NOTE   Events of this morning noted. Prognosis grim. Surgical management of bacterial endocarditis is obviously no longer an option. Please call if we can be of further assistance.  Rexene Alberts, MD 23-Mar-2018 8:26 AM

## 2018-03-08 NOTE — Progress Notes (Signed)
Responded to unit page to provide support to patient family at bedside as patient was actively dying. Patient is deceased.  Provided Empathetic listening,emotional and grief support.  [place] if needed and available.    Jaclynn Major, [place], [facility], Pager [phone]

## 2018-03-08 NOTE — Procedures (Signed)
Endotracheal Intubation Procedure Note Indication for endotracheal intubation: impending respiratory failure Sedation: etomidate, fentanyl and midazolam Paralytic: succinylcholine Equipment: Macintosh 3 laryngoscope blade and 7.19mm cuffed endotracheal tube; secured 23cm at the lip Cricoid Pressure: no Number of attempts: 1 ETT location confirmed by by auscultation, by CXR and ETCO2 monitor.  Patient not protecting airway. PCCM consulted emergently to see patient in CT scanner. Brought patient emergently to ICU and intubated. Patient tolerated procedure well with no complications.

## 2018-03-08 NOTE — Progress Notes (Signed)
Pt intubated at bedside with 20 mg etomidate, 50 mcg fentanyl, 100 succs, and 2 mg versed.

## 2018-03-08 NOTE — Code Documentation (Signed)
  Patient Name: Aaron Dodson   MRN: [id number]   Date of Birth/ Sex: Nov 28, 1965 , male      Admission Date: 02/28/2018  Attending Provider: Reyne Dumas, MD  Primary Diagnosis: Aortic valve endocarditis   Indication: Pt was in his usual state of health until this AM, when he was noted to be in sinus bradycardia and pulseless. Code blue was subsequently called. At the time of arrival on scene, ACLS protocol was underway.   Technical Description:  - CPR performance duration:  5 minutes  - Was defibrillation or cardioversion used? No   - Was external pacer placed? Yes  - Was patient intubated pre/post CPR? Yes   Medications Administered: Y = Yes; Blank = No Amiodarone    Atropine    Calcium    Epinephrine  2  Lidocaine    Magnesium    Norepinephrine    Phenylephrine    Sodium bicarbonate    Vasopressin     Post CPR evaluation:  - Final Status - Was patient successfully resuscitated ? Yes - What is current rhythm? Sinus bradycardia  - What is current hemodynamic status? Hypotensive   Miscellaneous Information:  - Labs sent, including: None   - Primary team notified?  Yes  - Family Notified? Yes  - Additional notes/ transfer status:  Patient is now DNR      Welford Roche, MD  03-18-2018, 9:23 AM

## 2018-03-08 NOTE — Progress Notes (Addendum)
PULMONARY  / CRITICAL CARE MEDICINE  Name: Naim Murtha MRN: [id number] DOB: 04-29-1966    LOS: 10  REFERRING MD :  Dr. 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 He was found down and unresponsive on 4/18 and stat head CT revealed at massive [facility]. He was intubated due to inability to protect his airway and was admitted to the ICU.   LINES / TUBES: 4/12 PIV RUE 4/16 PIV RUE  4/18 PIV LUE and Foley   CULTURES: Bcx 4/12 - Streptococcus viridians  Bcx 4/15 - no growth in 48h  Bcx 4/18 - sent and pending  Tracheal aspirate cx - sent and pending   ANTIBIOTICS: Rocephin 4/12-4/14 Penicillin G 4/15-  SIGNIFICANT EVENTS:  4/12 - Admitted for fever and worsenigng dyspnea  4/16 - TEE showed 1x0.6cm aortic valve vegetation and severe AI   4/18 -  MRI with R internal capsule CVA and possible meningitis. Found unresponsive on floor. STAT head CT showed massive SAH. Intubated and admitted to ICU   INTERVAL HISTORY: Patient now intubated. Off sedation for at least 1h and unresponsive.   VITAL SIGNS: Temp:  [98.1 F (36.7 C)-101.1 F (38.4 C)] 99.3 F (37.4 C) (04/18 0624) Pulse Rate:  [99-108] 99 (04/18 0600) Resp:  [19-27] 22 (04/18 0600) BP: (93-150)/(32-64) 130/57 (04/18 0600) SpO2:  [96 %-100 %] 96 % (04/18 0657) FiO2 (%):  [40 %-100 %] 40 % (04/18 0646) Weight:  [144 lb 12.8 oz (65.7 kg)-147 lb 7.8 oz (66.9 kg)] 147 lb 7.8 oz (66.9 kg) (04/18 0623) HEMODYNAMICS:   VENTILATOR SETTINGS: Vent Mode: PRVC FiO2 (%):  [40 %-100 %] 40 % Set Rate:  [16 bmp-22 bmp] 22 bmp Vt Set:  [620 mL] 620 mL PEEP:  [5 cmH20] 5 cmH20 Plateau Pressure:  [13 cmH20] 13  cmH20 INTAKE / OUTPUT: Intake/Output      04/17 0701 - 04/18 0700 04/18 0701 - 04/19 0700   P.O. 960    I.V. (mL/kg) 388.5 (5.8)    Blood 315    IV Piggyback 1750    Total Intake(mL/kg) 3413.5 (51)    Urine (mL/kg/hr) 1675 (1)    Emesis/NG output 100    Blood     Total Output 1775    Net +1638.5           PHYSICAL EXAMINATION: General:  Patient intubated and unresponsive while off sedation  Neuro: patient is unresponsive, does not withdraw to pain, does not follow commands  HEENT: pupils fixed and dilated  Cardiovascular: RRR, nl S1/S2, no mrg  Lungs: mechanical breath sounds, no wheezes or crackles, on vent  Abdomen:  Soft, NTND, decreased bowel sounds  Musculoskeletal: L AKA, no LE edema  Skin:  No rashes or lesions noted    LABS: Cbc Recent Labs  Lab 02/19/18 0439 02/20/18 0505 02/22/18 0237  WBC 8.7 10.8* 9.2  HGB 8.7* 8.8* 7.8*  HCT 25.7* 26.4* 23.1*  PLT 186 222 200    Chemistry  Recent Labs  Lab 02/20/18 0505 02/14/2018 1530 02/22/18 0237  NA 138 134* 132*  K 3.1* 3.6 3.4*  CL 107 106 100*  CO2 22 18* 22  BUN <  5* 6 5*  CREATININE 1.05 1.08 1.13  CALCIUM 9.6 8.8* 8.5*  MG  --  1.4*  --   GLUCOSE 107* 133* 129*    Liver fxn Recent Labs  Lab 02/16/18 2147 03/03/2018 2220 02/19/18 0439  AST 33 31 18  ALT 11* 12* 8*  ALKPHOS 78 85 62  BILITOT 0.7 0.7 0.5  PROT 6.6 6.3* 5.7*  ALBUMIN 2.3* 2.3* 1.9*   coags Recent Labs  Lab 02/10/2018 2100  INR 1.13   Sepsis markers Recent Labs  Lab 02/18/18 0252 02/18/18 0942 02/19/18 0439  LATICACIDVEN 2.57* 2.26* 1.5   Cardiac markers Recent Labs  Lab 02/18/18 0935 02/18/18 2105 02/19/18 0439  TROPONINI 0.18* 0.16* 0.25*   BNP No results for input(s): PROBNP in the last 168 hours. ABG Recent Labs  Lab Mar 23, 2018 0641  PHART 7.261*  PCO2ART 40.4  PO2ART 412.0*  HCO3 18.1*  TCO2 19*    CBG trend Recent Labs  Lab 03-23-2018 0620  GLUCAP 311*    IMAGING: 1. MRI brain 4/18:  abnormal enhancement of basilar meninges and L perimesencephalic cistern with extension around the left anteromedial aspect of the right temporal lobe. Finding highly suspicious for infectious meningitis. 2.5 cm curvilinear infarct involving the right globus pallidus/posterior right internal capsule  2. Head CT 4/18: widespread subarachnoid hemorrhage throughout the basilar cisterns in left greater than right sylvian fissure.Grossly stable evolving acute ischemic right basal ganglia infarct.  3. CXR 4/18: ETT appropriately traced, vascular congestion, pulmonary edema, small b/l pleural effusions   4. TEE: EF 50-55%, bicuspid aortic valve with 1.6x0.6cm calcified vegetation, and mild-mod MR   ECG:  No new one to review    DIAGNOSES: Principal Problem:   Aortic valve endocarditis Active Problems:   Chronic coronary artery disease   Pain syndrome, chronic   NSTEMI (non-ST elevated myocardial infarction) (HCC)   IV drug abuse (HCC)   Renal insufficiency   Normocytic anemia   Hypotension   Bacteremia due to Streptococcus   Infection by Streptococcus, viridans group   Septic embolism (HCC)   SOB (shortness of breath)   COPD with acute exacerbation (HCC)   Hypoxemia   Acute pulmonary edema (HCC)   ASSESSMENT / PLAN:  PULMONARY A: COPD  History of PE  Pulmonary edema   PLAN: - S/p IV Lasix 20 mg x1  - Intubated and sedated  - Follow up ABG  - Scheduled duonebs  - Solumedrol 60 mg q6h - No anticoagulation in the setting of acute SAH   CARDIOVASCULAR A: 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 Has adequate UOP. No  history of CKD.   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 No acute neurosurgical intervention at this time  - Intubated and sedated with Fentanyl gtt with RASS goal -1 to -2    Welford Roche, MD  Internal Medicine PGY-1  P [phone] Mar 17, 2018, 7:24 AM

## 2018-03-08 NOTE — Progress Notes (Addendum)
STROKE TEAM PROGRESS NOTE   INTERVAL HISTORY His wife and other male is at the bedside.  He had a code stroke this am - likely herniated. Made a DNR. Family awaiting additional children and family to arrive,  CBC:  CBC Latest Ref Rng & Units Mar 12, 2018 02/22/2018 02/20/2018  WBC 4.0 - 10.5 K/uL 29.6(H) 9.2 10.8(H)  Hemoglobin 13.0 - 17.0 g/dL 10.2(L) 7.8(L) 8.8(L)  Hematocrit 39.0 - 52.0 % 30.8(L) 23.1(L) 26.4(L)  Platelets 150 - 400 K/uL 274 200 222     Comprehensive Metabolic Panel:   CMP Latest Ref Rng & Units 02/22/2018 02/15/2018 02/20/2018  Glucose 65 - 99 mg/dL 129(H) 133(H) 107(H)  BUN 6 - 20 mg/dL 5(L) 6 <5(L)  Creatinine 0.61 - 1.24 mg/dL 1.13 1.08 1.05  Sodium 135 - 145 mmol/L 132(L) 134(L) 138  Potassium 3.5 - 5.1 mmol/L 3.4(L) 3.6 3.1(L)  Chloride 101 - 111 mmol/L 100(L) 106 107  CO2 22 - 32 mmol/L 22 18(L) 22  Calcium 8.9 - 10.3 mg/dL 8.5(L) 8.8(L) 9.6  Total Protein 6.5 - 8.1 g/dL - - -  Total Bilirubin 0.3 - 1.2 mg/dL - - -  Alkaline Phos 38 - 126 U/L - - -  AST 15 - 41 U/L - - -  ALT 17 - 63 U/L - - -   Lipid Panel:     Component Value Date/Time   CHOL 118 02/19/2018 0349   TRIG 122 02/20/2018 0349   HDL 15 (L) 02/08/2018 0349   CHOLHDL 7.9 02/08/2018 0349   VLDL 24 02/09/2018 0349   LDLCALC 79 02/27/2018 0349   HgbA1c: No results found for: HGBA1C Urine Drug Screen:     Component Value Date/Time   LABOPIA POSITIVE (A) 11/08/2016 1720   COCAINSCRNUR NONE DETECTED 11/08/2016 1720   LABBENZ POSITIVE (A) 11/08/2016 1720   AMPHETMU NONE DETECTED 11/08/2016 1720   THCU NONE DETECTED 11/08/2016 1720   LABBARB NONE DETECTED 11/08/2016 1720    Alcohol Level No results found for: ETH  Ct Angio Head W Or Wo Contrast  Result Date: Mar 12, 2018 CLINICAL DATA:  Initial evaluation for acute subarachnoid hemorrhage. EXAM: CT ANGIOGRAPHY HEAD TECHNIQUE: Multidetector CT imaging of the head was performed using the standard protocol during bolus administration of  intravenous contrast. Multiplanar CT image reconstructions and MIPs were obtained to evaluate the vascular anatomy. CONTRAST:  64m ISOVUE-370 IOPAMIDOL (ISOVUE-370) INJECTION 76% COMPARISON:  Prior noncontrast head CT performed earlier the same day. FINDINGS: CTA HEAD Anterior circulation: Examination markedly limited due to poor opacification of the major intracranial arterial vasculature, suspected to be related to low cardiac output. Distal cervical segments of the internal carotid arteries are well opacified and widely patent. Petrous segments patent bilaterally. Scattered multifocal atheromatous plaque within the cavernous ICAs without obvious high-grade stenosis. There is question of focal irregularity near the expected takeoff of the left posterior communicating artery (series 9, image 99). Unclear whether this reflects vaso spasm, small vascular infundibulum related to a hypoplastic left P com, or possibly small aneurysm. ICA termini are grossly patent distally. A1 segments and anterior communicating artery extremely poorly evaluated due to technique and poor opacification. No obvious aneurysm. A2 segments patent to their distal aspects without obvious stenosis Left M1 segment grossly patent and opacified, but is thin in appearance, suspected to at least in part reflect spasm. No obvious aneurysm or discernible bleed arising from the left M1 segment. Left MCA bifurcation poorly evaluated, although no obvious aneurysm identified. Left M2 segments grossly patent proximally. Distal left  MCA branches not evaluated due to poor contrast opacification. Right M1 segment patent proximally there is question of a focal arterial blush of contrast arising from the mid right M1 segment (series 9, image 91), which may reflect and irregular mycotic aneurysm or possibly arterial rupture. Finding is not entirely certain given poor quality the exam. There is some contrast opacification of the right MCA branches distally,  although extremely poorly seen. Distal right MCA branches not well evaluated. Posterior circulation: Vertebral arteries patent to the vertebrobasilar junction without stenosis. Posterior inferior cerebral arteries patent proximally. Basilar artery mildly tortuous but patent to its distal aspect. No appreciable basilar tip aneurysm or stenosis. Superior cerebral arteries grossly patent proximally. Both of the PCAs appear to be primarily supplied via the basilar and patent proximally. PCAs grossly patent to their distal aspects, although poorly evaluated due to contrast bolus. Venous sinuses: Not well evaluated due to timing of the contrast bolus. Anatomic variants: None significant. Delayed phase: Not performed. IMPRESSION: 1. Extremely limited CTA due to poor opacification of the intracranial arterial vasculature, most likely due to poor cardiac output. 2. Question focal irregularity with possible extravasation of contrast arising from the right M1 segment, which may be due to mycotic aneurysm or possibly vascular rupture given patient's history of bacteremia and endocarditis. Again, finding is not entirely certain given the poor quality of this exam. Finding could be further assessed with DSA for further evaluation. 3. Question additional focal irregularity at the expected takeoff of the left P com. Finding may reflect a small aneurysm, vascular infundibulum related to a hypoplastic left P com, or possibly basal spasm. Critical Value/emergent results were discussed by telephone at the time of interpretation on 2018-03-03 at 6:00 am with Dr. Roland Rack. Electronically Signed   By: Jeannine Boga M.D.   On: Mar 03, 2018 06:26   Mr Jeri Cos KX Contrast  Result Date: 03-03-2018 CLINICAL DATA:  52 year old male with history of IV drug abuse, strep viridans bacteremia with valvular vegetations. Evaluate for septic emboli. EXAM: MRI HEAD WITHOUT AND WITH CONTRAST TECHNIQUE: Multiplanar, multiecho pulse  sequences of the brain and surrounding structures were obtained without and with intravenous contrast. CONTRAST:  64m MULTIHANCE GADOBENATE DIMEGLUMINE 529 MG/ML IV SOLN COMPARISON:  None available. FINDINGS: Brain: Study moderately degraded by motion artifact. Mildly advanced cerebral atrophy for age. 2.5 cm curvilinear focus of restricted diffusion seen involving the right globus pallidus/posterior limb of the right internal capsule, consistent with acute ischemic infarct (series 6001, image 63). Extension towards the right MCA cistern inferiorly (series 8001, image 51). Finding consistent with an acute ischemic infarct. No associated hemorrhage or discernible enhancement. There is abnormal and fairly avid thick enhancement involving the basilar meninges, most evident at the left perimesencephalic cistern at the level of the mid brain (series 20001, image 25). Extension towards the right MCA cistern anteriorly (series 21001, image 18). Abnormal enhancement also extends towards the superior cerebellar cistern and cerebellar vermis posteriorly (series 21001, image 10). Branching thin left meningeal enhancement seen emanating from this region. Finding highly suspicious for basilar meningitis. While the above-mentioned infarct could conceivably be due to a septic embolus, infarct secondary to an infectious process at the base of the brain could also produce this finding. No other evidence for acute or subacute ischemia. No other significant abnormal enhancement within the brain. No frank intracerebral or intraparenchymal abscess. No other mass lesion or significant abnormal enhancement. No acute or chronic intracranial hemorrhage. Ventricles normal size without hydrocephalus. No extra-axial fluid collection. Vascular:  Major intravascular flow voids are maintained. Skull and upper cervical spine: Craniocervical junction normal. Upper cervical spine within normal limits. Bone marrow signal intensity diffusely decreased  on T1 weighted imaging, suspected to be related to anemia and/or chronic disease. No acute scalp soft tissue abnormality. Sinuses/Orbits: Globes and orbital soft tissues within normal limits. Mild scattered mucosal thickening within the ethmoidal air cells. Paranasal sinuses are otherwise clear. No significant mastoid effusion. Inner ear structures normal. Other: None. IMPRESSION: 1. Abnormal enhancement involving the basilar meninges, most evident at the left perimesencephalic cistern with extension around the left anteromedial aspect of the right temporal lobe. Finding highly suspicious for infectious meningitis given the provided history. Correlation with CSF studies recommended. 2. 2.5 cm curvilinear infarct involving the right globus pallidus/posterior right internal capsule, favored to be secondary to the acute process involving the basilar meninges. While a septic embolus could also be considered, this would perhaps be less favored given the relative lack of associated enhancement or hemorrhage or additional infarcts within the brain. Electronically Signed   By: Jeannine Boga M.D.   On: 2018/02/26 04:26   Dg Chest Port 1 View  Result Date: February 26, 2018 CLINICAL DATA:  Endotracheal tube and orogastric tube placement. EXAM: PORTABLE CHEST 1 VIEW COMPARISON:  Chest radiograph performed 03/07/2018 FINDINGS: The patient's endotracheal tube is seen ending 4-5 cm above the carina. An enteric tube is noted extending below the diaphragm. Vascular congestion is noted. Diffusely increased interstitial markings are concerning for pulmonary edema. Small bilateral pleural effusions are noted. No pneumothorax is seen. The cardiomediastinal silhouette is borderline normal in size. No acute osseous abnormalities are identified. IMPRESSION: 1. Endotracheal tube seen ending 4-5 cm above the carina. 2. Enteric tube noted extending below the diaphragm. 3. Vascular congestion noted. Diffusely increased interstitial  markings are concerning for pulmonary edema. Small bilateral pleural effusions noted. Electronically Signed   By: Garald Balding M.D.   On: 2018/02/26 06:32   Dg Chest Port 1 View  Result Date: 02/25/2018 CLINICAL DATA:  Shortness of Breath EXAM: PORTABLE CHEST 1 VIEW COMPARISON:  Chest radiograph and chest CT February 17, 2018 FINDINGS: Lungs are mildly hyperexpanded. There is underlying centrilobular emphysematous change, better appreciated on CT. There is currently interstitial edema superimposed on emphysematous change. There is a small focus of airspace opacity in the lateral right base. Heart is enlarged with pulmonary venous hypertension. No adenopathy. No bone lesions. IMPRESSION: The appearance is consistent with a degree of interstitial edema superimposed on underlying emphysema. Suspect a degree of congestive heart failure. Small focus of airspace opacity in the lateral right base may represent early pneumonia superimposed. A small focus of alveolar edema in this area is felt to be less likely. Electronically Signed   By: Lowella Grip III M.D.   On: 02/26/2018 15:22   Ct Head Code Stroke Wo Contrast`  Result Date: 2018/02/26 CLINICAL DATA:  Code stroke. Initial evaluation for new onset altered mental status, status post fall out of bed, on heparin drip. EXAM: CT HEAD WITHOUT CONTRAST TECHNIQUE: Contiguous axial images were obtained from the base of the skull through the vertex without intravenous contrast. COMPARISON:  Prior MRI from earlier the same day. FINDINGS: Brain: Extensive acute subarachnoid hemorrhage now seen throughout the basilar cisterns, extending into the sylvian fissures bilaterally, left greater than right. Subarachnoid blood extends superiorly into the frontal lobes bilaterally, also worse on the left. Subarachnoid overlies the anterior temporal horns bilaterally. This appears new relative to previous MRI. Previously identified acute ischemic infarct  involving the right globus  pallidus again seen, grossly stable from previous. No other discernible large vessel territory infarct. No mass lesion or midline shift. No hydrocephalus. No extra-axial fluid collection. Vascular: Intracranial vasculature not well evaluated due to the presence of the subarachnoid blood. Skull: Scalp soft tissues demonstrate no acute abnormality. Calvarium intact. Sinuses/Orbits: Globes and orbital soft tissues within normal limits. Paranasal sinuses and mastoid air cells are clear. Other: None. IMPRESSION: 1. Interval development of widespread subarachnoid hemorrhage throughout the basilar cisterns in left greater than right sylvian fissure. 2. Grossly stable evolving acute ischemic right basal ganglia infarct. Critical Value/emergent results were discussed by telephone at the time of interpretation on 03/05/2018 at 5:58 am to Dr. Leonel Ramsay , who verbally acknowledged these results. Electronically Signed   By: Jeannine Boga M.D.   On: 05-Mar-2018 06:01     Vitals:   03-05-18 0646 Mar 05, 2018 0657 03/05/18 0808 2018/03/05 0826  BP:      Pulse:      Resp:      Temp:      TempSrc:      SpO2: 98% 96% 98% 96%  Weight:      Height:       Patient intubated. Unresponsive. BP low. HR low. Eyes midline without doll's. Pupils 68m and nonreactive. No corneals. No withdrawal to pain in extremities. HRR.  ASSESSMENT/PLAN Mr. TVivan Agostinois a 52y.o. 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 He was found down and unresponsive on 4/18. [facility] He was intubated due to inability to protect his airway and sent to the ICU. Code Blue 404/28/19- pulseless in sinus brady.  Stroke:  Massive diffuse bilateral SAH in setting of streptococcal viridans AV  endocarditis, meningitis and R PLIC ischemic infarct.   Code blue this am with likely herniation. Made DNR. Exam consistent with brain death.   H&H grade 5  Seen by neurosurgery. Not felt to be a surgical candidate. Poor prognosis  MRI  4/18 0426 abnormal enhancement basilar meninges suspicious for infectious meningitis. R globus pallidus/posterior IC infarct (doubt infectious)  Code Stroke CT head 4/18 0524 interval development widespread SAH L>R sylvian fissure. R BG infarct stable.  CTA head 4/18 0546 limited d/t poor cardiac OP. ? R MI irregularity. ? L Pcom irregularity  TEE AV vegetation and severe AI  Carotid Doppler  B ICA 1-39% stenosis, VAs antegrade   2D Echo  EF 55-60%. Large AV vegetation w/ severe regurg  LDL 79  HgbA1c not ordered  SCDs for VTE prophylaxis  Diet NPO time specified  Eliquis (apixaban) daily prior to admission, now on No antithrombotic d/t SAH.   Paroxysmal Atrial Fibrillation  Home anticoagulation:  Eliquis (apixaban) daily continued in the hospital  Endocaridits  On abx  Surgical management no longer an option. TCTS signed off  Respiratory failure secondary to [facility] Hypertension . Variable, 90s-140s . Given SAH, SBP goal < 160  Hyperlipidemia  Home meds:  none  LDL 79  Other Stroke Risk Factors  Cigarette smoker  Hx ETOH use  Hx IV drug use.   UDS / ETOH level not performed   Hx stroke/TIA   Timing unclear,  2005-2006. neg MRI in 2012 & 2013, neg CT 2017.  Coronary artery disease - mult PCIs, MI  Hx PE  Hospital day # [place], MSN, APRN, ANVP-BC, AGPCNP-BC Advanced Practice Stroke Nurse [place] for Schedule & Pager information Mar 18, 2018 10:14 AM  I have personally examined this patient, reviewed notes, independently viewed imaging studies, participated in medical decision making and plan of care.ROS completed by me personally and pertinent positives fully documented  I have  made any additions or clarifications directly to the above note. Agree with note above.  The patient unfortunately was expired by the time I came to see the patient during my rounds.  Antony Contras, MD Medical Director Miguel Barrera Pager: [phone] 03-18-2018 3:40 PM  To contact Stroke Continuity provider, please refer to [email or web address] After hours, contact General Neurology

## 2018-03-08 NOTE — Progress Notes (Signed)
Patient deceased at 1002, no HR or spontaneous respirations pronounced by myself and Antoine Primas.Marland Kitchen Patient rapidly declined at 8:45;  full code changed to DNR after initial heroic measures. Emotional support provided to family throughout morning.

## 2018-03-08 NOTE — Progress Notes (Signed)
Pt found on the floor beside bed with no visible signs injury and was unable to communicate what happened. Pt A/O x4 throughout shift and able to verbalize needs. BP post fall 150/64, nonverbal, HR 130s, R 30's with expiratory wheezes in upper lobes. Pt placed back in bed and continued to try and get out of bed. Pt unable to verbalize if he hit his head or was hurt from the fall. Elmer Boutelle and rapid response was paged. Rapid response came to bedside. Pt continued to have increased agitation with HR up to 150s with some grunting respirations. Paged Baltazar Najjar again to come to beside. Baltazar Najjar assessed pt and code stroke was called. Pt takn emergently to CT with RN, rapid response, respiratory therapy and Baltazar Najjar. CT completed. Report called to 73M and pt transferred to 73M post CT.

## 2018-03-08 NOTE — Progress Notes (Signed)
Shift event: RN paged because pt was found on the floor beside of bed. ? Trauma to head. Pt was non verbal at first, but quickly decompensated. NP to bedside. Code stroke called. Neuro paged to meet [place] in CT. Pt transported to CT head with staff. CT + massive SAH. NSU paged and spoke with neurologist.  S: unable to participate due to mental status. Pt had been alert and oriented prior to event. Pt has had 2mg  MSO4 and an Oxy in past 1.5 hrs. No benzos lately.  O: 150/68. HR 130s. RR upper 20s and labored with use of accessory muscles. Unresponsive to verbal or tactile stimuli. Pupils unequal. Right 93mm, left 42mm and reactive. No purposeful movements. Does not follow commands.  A/P: 1. Aortic vegetation now with massive SAH-heparin drip off around 5am. NSU consulted. Neuro on case. PCCM intubating pt.  PCCM/Neuro/NSU will assume care of pt. [facility] signing off.  KJKG, NP [facility]  Total critical care time: 45 minutes Critical care time was exclusive of separately billable procedures and treating other patients. Critical care was necessary to treat or prevent imminent or life-threatening deterioration. Critical care was time spent personally by me on the following activities: development of treatment plan with patient and/or surrogate as well as nursing, discussions with consultants, evaluation of patient's response to treatment, examination of patient, obtaining history from patient or surrogate, ordering and performing treatments and interventions, ordering and review of laboratory studies, ordering and review of radiographic studies, pulse oximetry and re-evaluation of patient's condition.

## 2018-03-08 NOTE — Progress Notes (Signed)
Fent gtt wasted, 240 ml wasted in trash, with [employer].

## 2018-03-08 NOTE — Death Summary Note (Signed)
  Name: Aaron Dodson MRN: [id number] DOB: 03-10-1966 52 y.o.  Date of Admission: 03/04/2018  8:35 PM Date of Discharge: 2018/03/10 Attending Physician: Dr. Baltazar Apo   Discharge Diagnosis: Principal Problem:   Aortic valve endocarditis Active Problems:   Chronic coronary artery disease   Pain syndrome, chronic   NSTEMI (non-ST elevated myocardial infarction) (HCC)   IV drug abuse (HCC)   Renal insufficiency   Normocytic anemia   Hypotension   Bacteremia due to Streptococcus   Infection by Streptococcus, viridans group   Septic embolism (HCC)   SOB (shortness of breath)   COPD with acute exacerbation (HCC)   Hypoxemia   Acute pulmonary edema (HCC)   Pressure injury of skin   Subarachnoid hemorrhage   Cause of death: Complications of a massive subarachnoid hemorrhage  Time of death: 1002  Disposition and follow-up:   Mr.[name] was discharged from [facility] in expired condition.    Hospital Course: Mr. 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 On the morning of 4/18 patient was found down and unresponsive in his room. 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 He was intubated due to inability to protect his airway and was admitted to the ICU. In the ICU, patient became bradycardic and pulseless. ACLS protocol initiated for PEA arrest and ROSC achieved in 5 minutes. Medical team discussed with family patient's poor prognosis, non candidacy for neurosurgical intervention, and unlikelihood of recovery of functional status and patient was made DNR. He died peacefully at 53.   Signed: Welford Roche, MD 03/10/2018, 9:45 AM

## 2018-03-08 NOTE — Progress Notes (Signed)
Patient became bradycardic with HR 30-40s and hypotensive 70s/60s this morning after initial evaluation. External pacer placed with capture at 79mamps. Five minutes later he was noted to be pulseless and ACLS protocol initiated for PEA arrest. ROSC achieved after 5 minutes. He received 2 doses of epinephrine.   I (Dr. Frederico Hamman) spoke with family, wife and son. Discussed poor prognosis and unlikelihood of functional recovery in the setting of massive SAH. Family understands. If he were to decompensate again, family does not want chest compressions or shocks. They do state they would like to continue current interventions including intubation and IV medication until family arrives to see patient.

## 2018-03-08 NOTE — Progress Notes (Addendum)
PULMONARY / CRITICAL CARE MEDICINE   Name: Aaron Dodson MRN: [id number] DOB: Jul 29, 1966    ADMISSION DATE:  02/08/2018 CONSULTATION DATE: 4/16  REFERRING MD:  RAI  CHIEF COMPLAINT:  Dyspnea  HISTORY OF PRESENT ILLNESS:  52 year old with a history of IV drug abuse who presented on 4/2 complaining of months of fever and new onset dyspnea.  He was found to be bacteremic with strep viridans transthoracic echocardiogram showed normal systolic LV function with severe AI and a large aortic valve vegetation.  He was taken for TEE today at which time he received sedation with propofol.  He apparently was relatively hypotensive and received a total of 2 L of normal saline for the procedure.  Following the procedure he is complaining of increased dyspnea.  He did not have any nausea or vomiting associated with the procedure.  He is a lifelong heavy smoker.  Not currently having any cough.  SUBJECTIVE:   Early 4/18 AM was found on the floor bedside hospital bed and was unresponsive. He was taken for emergent CT which demonstrated massive SAH. Transferred emergently to ICU for intubation.     VITAL SIGNS: BP (!) 125/52   Pulse (!) 108   Temp 98.1 F (36.7 C) (Oral)   Resp (!) 22   Ht 6' (1.829 m)   Wt 65.7 kg (144 lb 12.8 oz)   SpO2 97%   BMI 19.64 kg/m   HEMODYNAMICS:    VENTILATOR SETTINGS:    INTAKE / OUTPUT: I/O last 3 completed shifts: In: 5878.1 [P.O.:840; I.V.:1973.1; Blood:315; IV Piggyback:2750] Out: 4710 [Urine:4710]  PHYSICAL EXAMINATION: General: Acutely ill appearing male HEENT: [place]/AT Neuro: Comatose. Pupils 9mm and fixed. CV: Tachy, regular PULM: Coarse expiratory wheeze. Labored breathing.   GI: Soft, non-distended Extremities: warm/dry, no edema. L AKA Skin: no rashes or lesions, multiple tattoos   LABS:  BMET Recent Labs  Lab 02/20/18 0505 02/15/2018 1530 02/22/18 0237  NA 138 134* 132*  K 3.1* 3.6 3.4*  CL 107 106 100*  CO2 22 18* 22  BUN <5* 6 5*   CREATININE 1.05 1.08 1.13  GLUCOSE 107* 133* 129*    Electrolytes Recent Labs  Lab 02/20/18 0505 03/03/2018 1530 02/22/18 0237  CALCIUM 9.6 8.8* 8.5*  MG  --  1.4*  --     CBC Recent Labs  Lab 02/19/18 0439 02/20/18 0505 02/22/18 0237  WBC 8.7 10.8* 9.2  HGB 8.7* 8.8* 7.8*  HCT 25.7* 26.4* 23.1*  PLT 186 222 200    Coag's Recent Labs  Lab 02/22/2018 2100  INR 1.13    Sepsis Markers Recent Labs  Lab 02/18/18 0252 02/18/18 0942 02/19/18 0439  LATICACIDVEN 2.57* 2.26* 1.5    ABG No results for input(s): PHART, PCO2ART, PO2ART in the last 168 hours.  Liver Enzymes Recent Labs  Lab 02/16/18 2147 03/04/2018 2220 02/19/18 0439  AST 33 31 18  ALT 11* 12* 8*  ALKPHOS 78 85 62  BILITOT 0.7 0.7 0.5  ALBUMIN 2.3* 2.3* 1.9*    Cardiac Enzymes Recent Labs  Lab 02/18/18 0935 02/18/18 2105 02/19/18 0439  TROPONINI 0.18* 0.16* 0.25*    Glucose No results for input(s): GLUCAP in the last 168 hours.  Imaging Mr Jeri Cos HA Contrast  Result Date: 04-Mar-2018 CLINICAL DATA:  52 year old male with history of IV drug abuse, strep viridans bacteremia with valvular vegetations. Evaluate for septic emboli. EXAM: MRI HEAD WITHOUT AND WITH CONTRAST TECHNIQUE: Multiplanar, multiecho pulse sequences of the brain and surrounding structures were obtained  without and with intravenous contrast. CONTRAST:  103mL MULTIHANCE GADOBENATE DIMEGLUMINE 529 MG/ML IV SOLN COMPARISON:  None available. FINDINGS: Brain: Study moderately degraded by motion artifact. Mildly advanced cerebral atrophy for age. 2.5 cm curvilinear focus of restricted diffusion seen involving the right globus pallidus/posterior limb of the right internal capsule, consistent with acute ischemic infarct (series 6001, image 63). Extension towards the right MCA cistern inferiorly (series 8001, image 51). Finding consistent with an acute ischemic infarct. No associated hemorrhage or discernible enhancement. There is abnormal  and fairly avid thick enhancement involving the basilar meninges, most evident at the left perimesencephalic cistern at the level of the mid brain (series 20001, image 25). Extension towards the right MCA cistern anteriorly (series 21001, image 18). Abnormal enhancement also extends towards the superior cerebellar cistern and cerebellar vermis posteriorly (series 21001, image 10). Branching thin left meningeal enhancement seen emanating from this region. Finding highly suspicious for basilar meningitis. While the above-mentioned infarct could conceivably be due to a septic embolus, infarct secondary to an infectious process at the base of the brain could also produce this finding. No other evidence for acute or subacute ischemia. No other significant abnormal enhancement within the brain. No frank intracerebral or intraparenchymal abscess. No other mass lesion or significant abnormal enhancement. No acute or chronic intracranial hemorrhage. Ventricles normal size without hydrocephalus. No extra-axial fluid collection. Vascular: Major intravascular flow voids are maintained. Skull and upper cervical spine: Craniocervical junction normal. Upper cervical spine within normal limits. Bone marrow signal intensity diffusely decreased on T1 weighted imaging, suspected to be related to anemia and/or chronic disease. No acute scalp soft tissue abnormality. Sinuses/Orbits: Globes and orbital soft tissues within normal limits. Mild scattered mucosal thickening within the ethmoidal air cells. Paranasal sinuses are otherwise clear. No significant mastoid effusion. Inner ear structures normal. Other: None. IMPRESSION: 1. Abnormal enhancement involving the basilar meninges, most evident at the left perimesencephalic cistern with extension around the left anteromedial aspect of the right temporal lobe. Finding highly suspicious for infectious meningitis given the provided history. Correlation with CSF studies recommended. 2. 2.5 cm  curvilinear infarct involving the right globus pallidus/posterior right internal capsule, favored to be secondary to the acute process involving the basilar meninges. While a septic embolus could also be considered, this would perhaps be less favored given the relative lack of associated enhancement or hemorrhage or additional infarcts within the brain. Electronically Signed   By: Jeannine Boga M.D.   On: 2018-02-28 04:26   CULTURES:   ANTIBIOTICS: Penicillin G  SIGNIFICANT EVENTS: 4/13 admit for bacteremia   DISCUSSION: 52 year old M, smoker, with a history of IV drug abuse, likely has chronic lung disease at baseline based on bullous disease on admission CT scan of his chest significant smoking history. 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  PCCM asked to see the patient for dyspnea following a TEE earlier today.   ASSESSMENT / PLAN:  PULMONARY A:  Inability to protect airway in setting massive SAH COPD without acute exacerbation  P: STAT intubation ABG CXR VAP bundle Scheduled nebs.   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   PAF on eliquis  P: Telemetry monitoring DC heparin CVTS recommends cardiac cath, although prognosis significantly poorer now.   RENAL A: CKD II  P: Trend BMP / urinary output Replace electrolytes as indicated  GASTROINTESTINAL A:  No acute issues  P: NPO Protonix  HEME A: Heparin coagulopathy Anemia, chronic  P: DC heparin.  Protamine given in ED in the setting of SAH Follow CBC  ENDOCRINE A: No acute issues  P: Follow glucose on chemistry  INFECTIOUS A:  Strep Viridans Endocarditis  IVDA   P: Penicillin G per ID   NEUROLOGIC A:  Massive SAH after found on floor early 4/18  P: Neurology Following Neurosurgery to see.  Fentanyl infusion for RASS goal -1 to -2  Georgann Housekeeper, [facility] Pulmonology/Critical  Care Pager [phone] or [phone]  03-22-18 6:13 AM

## 2018-03-08 NOTE — Consult Note (Addendum)
Neurology Consultation Reason for Consult: Code Stroke Referring Physician: Tylene Fantasia  CC: Unresponsive  History is obtained from: Chart, referring  HPI: Aaron Dodson is a 52 y.o. male admitted with endocarditis who was normal earlier tonight and then had a rapid decline in mental status. He was given some oxycodone at 3:55 and was supposedly normal at that time.   He subsequently was found with no verbal response on the floor, but still concious, however, his mental status rapidly declined and he quit responding. A code stroke was called.   Apparently he was having some difficulty with his left side even prior to this event gfivne that an MRI was ordered and revealed an infarct with some findings concerning for possible meningitis.    LKW: 4:00 am  tpa given?: no, ICH NIHSS: 33   ROS: Unable to obtain due to altered mental status.   Past Medical History:  Diagnosis Date  . ADD (attention deficit disorder)   . Anxiety   . Chronic chest pain   . Chronic lower back pain   . Club foot   . COPD (chronic obstructive pulmonary disease) ([place])   . Coronary artery disease    a. s/p multiple prior PCIs (LAD, LCx, OM1 - [place]; [place]). b. Multiple prior caths, last in 12/2016 - medical therapy, no new lesions, nonobstructive.  . Endocarditis 2013  . GERD (gastroesophageal reflux disease)   . History of echocardiogram    Echo 11/09/16: mild LVH, EF 55-60, no RWMA, Gr 1 DD, mildly dilated aortic root (38 mm)  . Hyperlipidemia   . Hypertension   . IV drug abuse ([place])   . Myocardial infarction [facility]) 2000's - ~ 04/2015   "I've had a total of 3" (11/08/2016)  . PAF (paroxysmal atrial fibrillation) ([facility])    on Eliquis  . PTSD (post-traumatic stress disorder)   . Pulmonary embolism ([place])    "I've had 2 or 3" (11/08/2016)  . 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 Septic pulmonary embolism ([place]) 2013  . Squamous cell carcinoma of scalp   . Stroke [facility]) ~ 2005-2006 X 2   denies residual on 11/08/2016     Family History  Problem Relation Age of Onset  . Hypertension Father   . Lung cancer Father   . Heart attack Father      Social History:  reports that he has been smoking cigarettes.  He has a 17.50 pack-year smoking history. He has never used smokeless tobacco. He reports that he drank alcohol. He reports that he has current or past drug history.   Exam: Current vital signs: BP (!) 125/52   Pulse (!) 108   Temp 98.1 F (36.7 C) (Oral)   Resp (!) 22   Ht 6' (1.829 m)   Wt 65.7 kg (144 lb 12.8 oz)   SpO2 97%   BMI 19.64 kg/m  Vital signs in last 24 hours: Temp:  [98.1 F (36.7 C)-101.1 F (38.4 C)] 98.1 F (36.7 C) (04/18 0256) Pulse Rate:  [99-108] 108 (04/17 1545) Resp:  [19-27] 22 (04/18 0256) BP: (93-125)/(32-60) 125/52 (04/18 0256) SpO2:  [97 %] 97 % (04/17 0824) Weight:  [65.7 kg (144 lb 12.8 oz)] 65.7 kg (144 lb 12.8 oz) (04/18 0256)   Physical Exam  Constitutional: Appears older than stated age.  Psych: Affect appropriate to situation Eyes: No scleral injection HENT: No OP obstrucion Head: Normocephalic.  Cardiovascular: Normal rate and regular rhythm.  Respiratory: Effort normal, non-labored  breathing GI: Soft.  No distension. There is no tenderness.  Skin: WDI  Neuro: Mental Status: Patient is comatose Cranial Nerves: II: does not blink to threat L 12mm non reactive, R 63mm sluggish III,IV, VI: eyes are dysconjugate, no deviation.  V: VII: corneals present bilaterally.  VIII: hearing is intact to voice IX,X: gag+ Motor: Tone is normal. Bulk is normal. No respnose to noxious stimuli. Left AKA.  Sensory: No response Cerebellar: Does not perform.    I have reviewed labs in epic and the results pertinent to this consultation are: Bmp - unremarkable.   I have reviewed the images obtained:CT head - SAH, CTA- poor quality, ? R MCA bifurcation arterial blush  Impression: 52 yo M with massive SAH. He was given  protamine and neurosurgery has been consulted. His exam is poor, and given the rapidity of the decline, I suspect that he has a poor prognosis. Hunt and Hess grade 5. 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   Recommendations: 1) Appreciate nsgy assistance.  2) Antibiotics per ID, but would try to have adequate CNS coverage given MRI findings.  3) no antithrombotics.  4) stroke team to follow.    This patient is critically ill and at significant risk of neurological worsening, death and care requires constant monitoring of vital signs, hemodynamics,respiratory and cardiac monitoring, neurological assessment, discussion with family, other specialists and medical decision making of high complexity. I spent 50 minutes of neurocritical care time  in the care of  this patient.  Roland Rack, MD [facility] [phone]  If 7pm- 7am, please page neurology on call as listed in [place]. 03/23/2018  6:25 AM

## 2018-03-08 NOTE — Code Documentation (Signed)
52 yo male code stroke called after patient found on the floor beside the bed.  Pt assisted back to bed, nonverbal, labored respirations moving all 4 extremities equally.  Forrest Moron NP notified at to bedside. Previously alert oriented x4. LSW 0400.  CT/ CTA done.  Dr. Leonel Ramsay at bedside. NIHSS 33.  Pt transported to 38M02.  Dr. Jimmey Ralph at bedside for intubation.  Protamine 50mg  given in transport to 38M.

## 2018-03-08 DEATH — deceased

## 2018-06-18 IMAGING — CT CT HEAD CODE STROKE
4 series · 16 of 47 positions shown, 18 images · non-contrast
Comparison: Prior MRI from earlier the same day.

CLINICAL DATA: Code stroke. Initial evaluation for new onset
altered mental status, status post fall out of bed, on heparin drip.

EXAM:
CT HEAD WITHOUT CONTRAST
TECHNIQUE: Contiguous axial images were obtained from the base of the skull
through the vertex without intravenous contrast.

[Series 3: head without · axial · non-contrast · 0.41mm/px · z∈[-85,+50]mm · 7 of 37 slices shown, 9 images]
[im 5/37  brain]
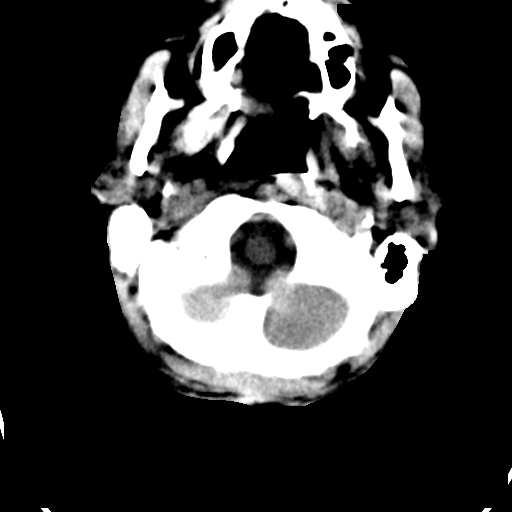
[im 5/37  bone]
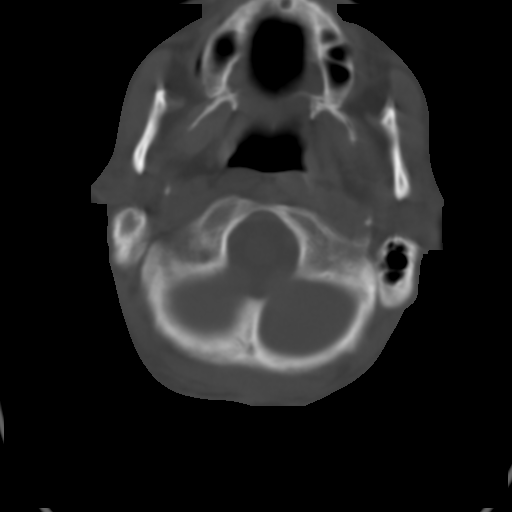
[im 10/37  brain]
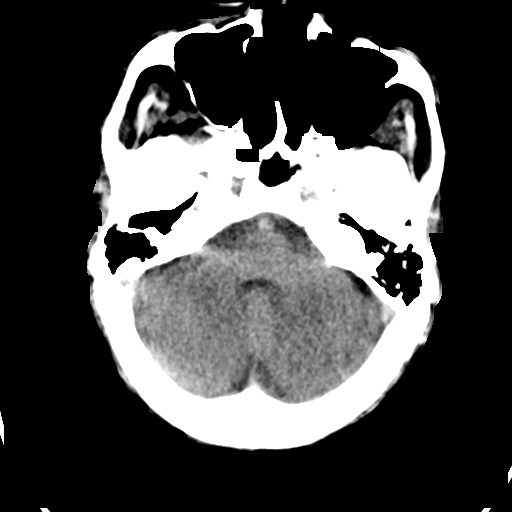
[im 14/37  brain]
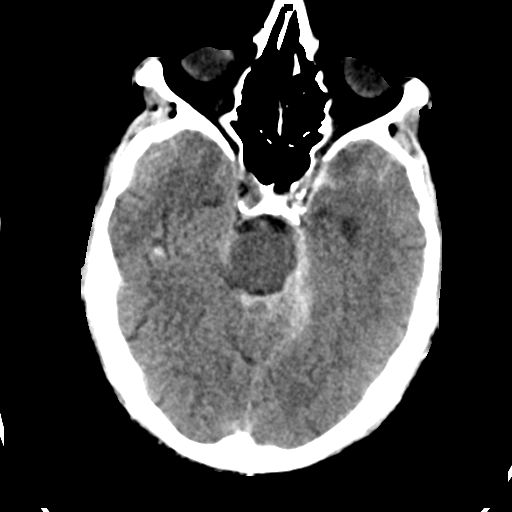
[im 19/37  brain]
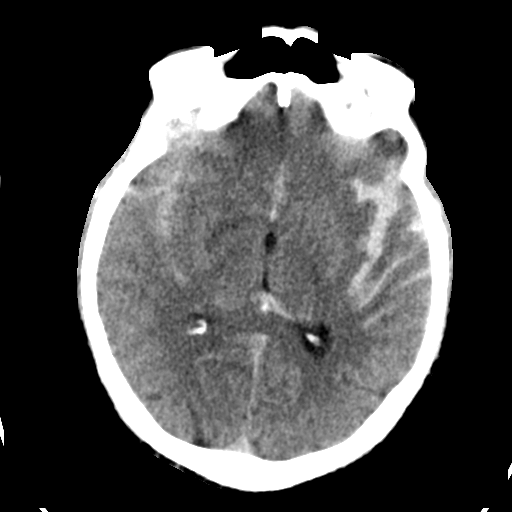
[im 23/37  brain]
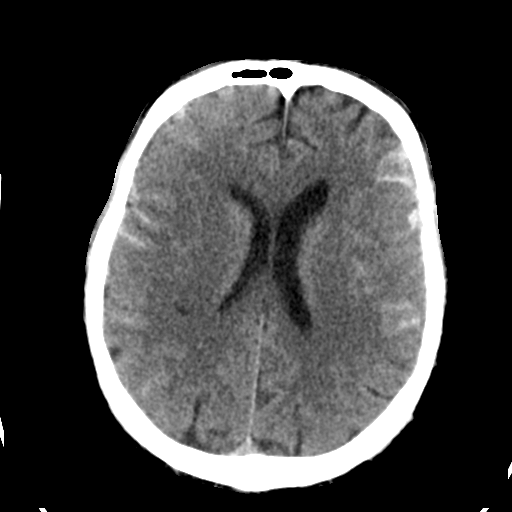
[im 23/37  bone]
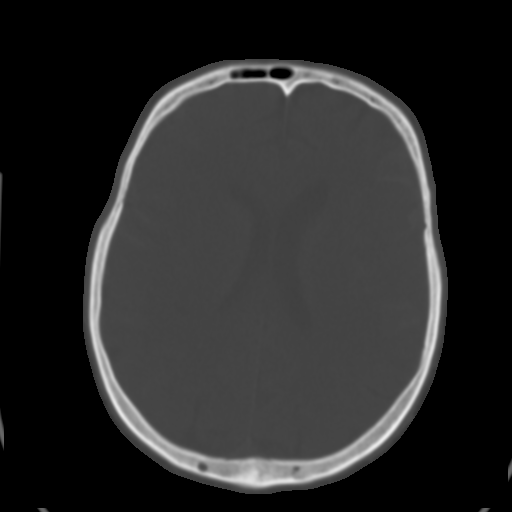
[im 28/37  brain]
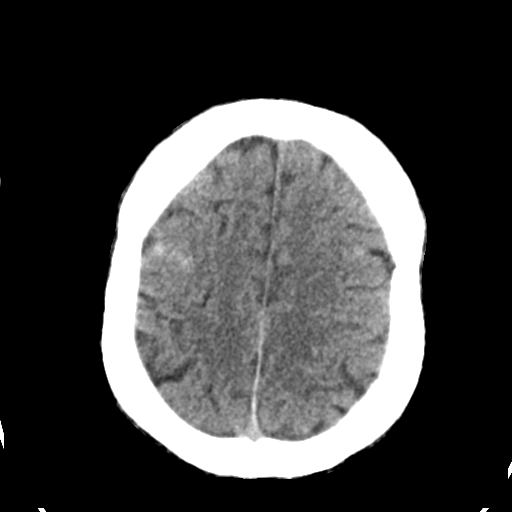
[im 32/37  brain]
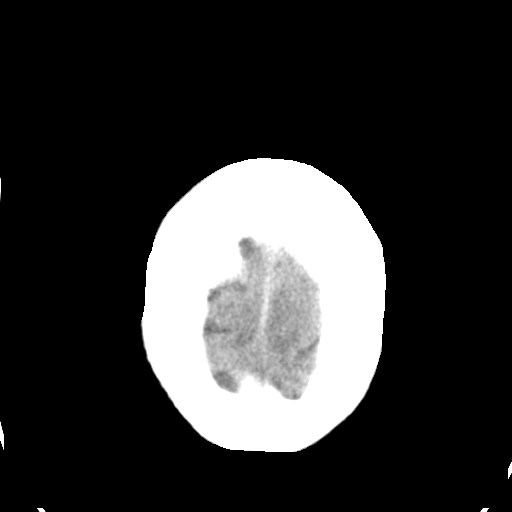

[Series 4: head bone · axial · 0.41mm/px · z∈[-87,-51]mm · 3 of 93 slices shown]
[im 10/93  bone]
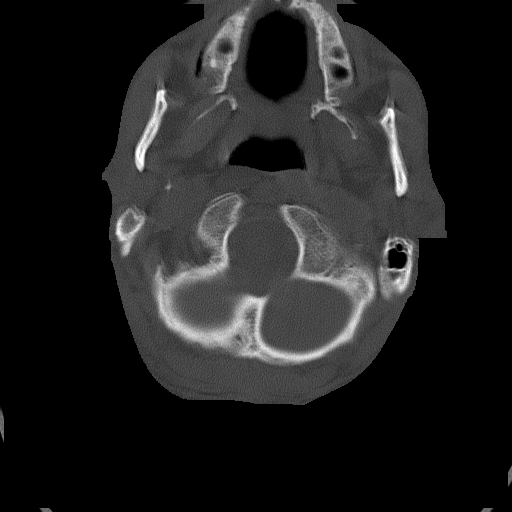
[im 19/93  bone]
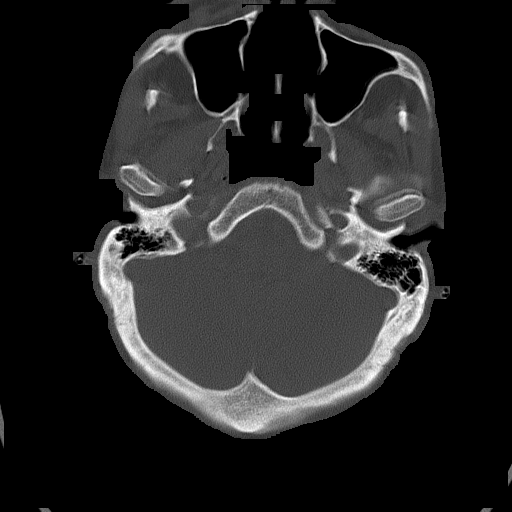
[im 28/93  bone]
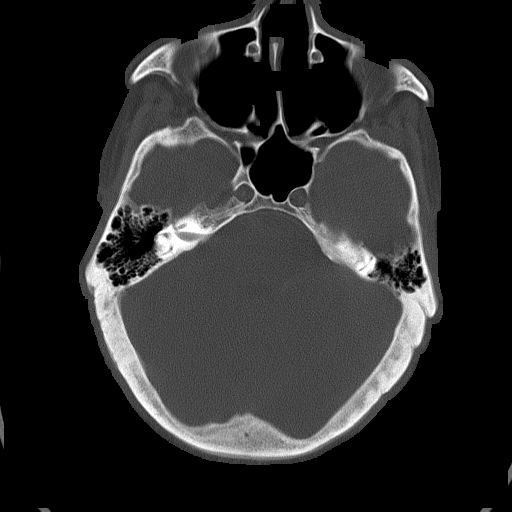

[Series 5: head without cor · coronal · non-contrast · 0.36mm/px · 3 of 68 slices shown]
[im 23/68  brain]
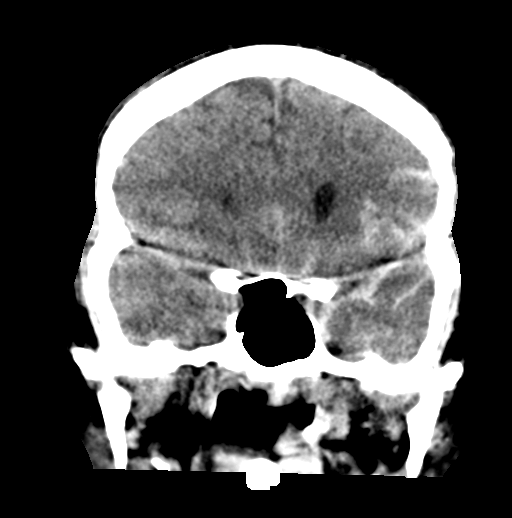
[im 30/68  brain]
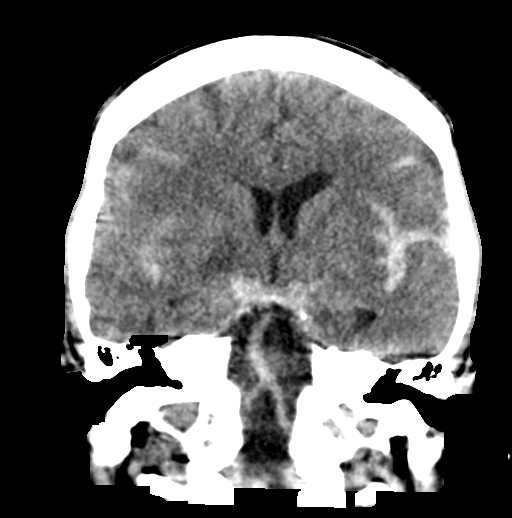
[im 38/68  brain]
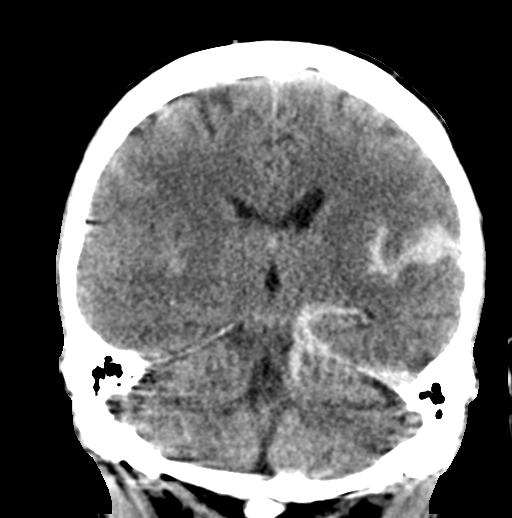

[Series 6: head without sag · sagittal · non-contrast · 0.36mm/px · 3 of 59 slices shown]
[im 20/59  brain]
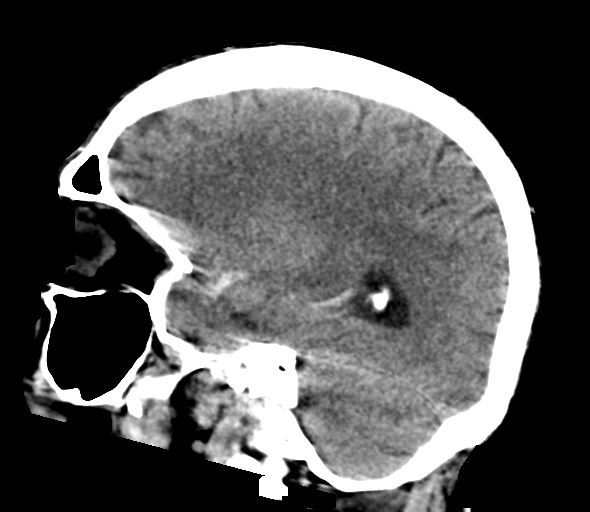
[im 30/59  brain]
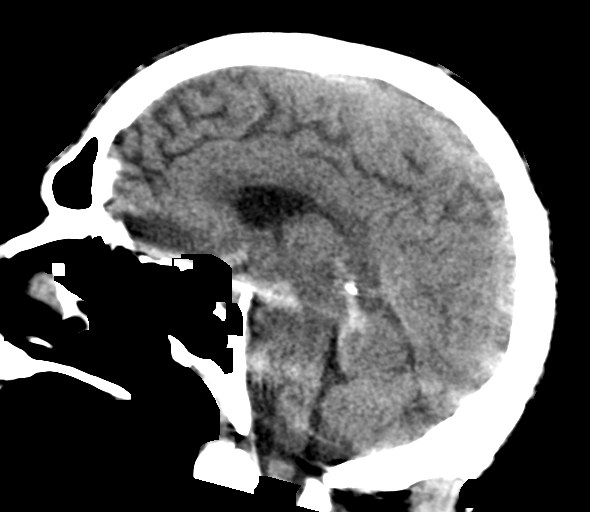
[im 39/59  brain]
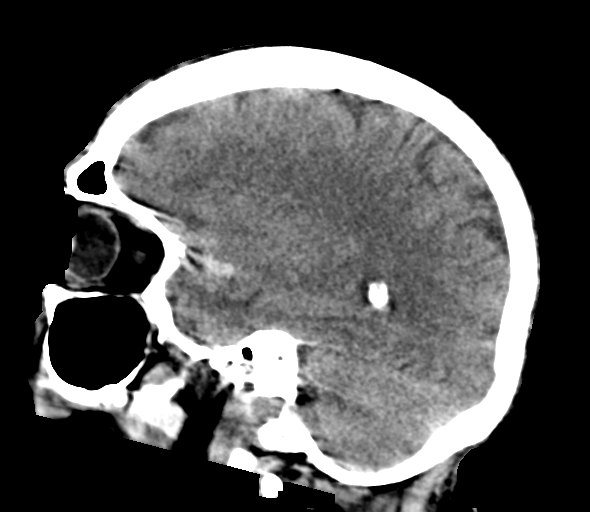

[16 of 47 positions shown; findings below may reference images not displayed]

FINDINGS: Brain: Extensive acute subarachnoid hemorrhage now seen throughout
the basilar cisterns, extending into the sylvian fissures
bilaterally, left greater than right. Subarachnoid blood extends
superiorly into the frontal lobes bilaterally, also worse on the
left. Subarachnoid overlies the anterior temporal horns bilaterally.
This appears new relative to previous MRI.

Previously identified acute ischemic infarct involving the right
globus pallidus again seen, grossly stable from previous. No other
discernible large vessel territory infarct. No mass lesion or
midline shift. No hydrocephalus. No extra-axial fluid collection.

Vascular: Intracranial vasculature not well evaluated due to the
presence of the subarachnoid blood.

Skull: Scalp soft tissues demonstrate no acute abnormality.
Calvarium intact.

Sinuses/Orbits: Globes and orbital soft tissues within normal
limits. Paranasal sinuses and mastoid air cells are clear.

Other: None.
IMPRESSION: 1. Interval development of widespread subarachnoid hemorrhage
throughout the basilar cisterns in left greater than right sylvian
fissure.
2. Grossly stable evolving acute ischemic right basal ganglia
infarct.

Critical Value/emergent results were discussed by telephone at the
time of interpretation on 02/23/2018 at [DATE] to Dr. Radomir Rajo ,
who verbally acknowledged these results.
